# Patient Record
Sex: Female | Born: 1935 | Race: Black or African American | Hispanic: No | Smoking: Never smoker
Health system: Southern US, Community
[De-identification: ages and names within clinical notes are randomized; demographics above are authoritative.]

## PROBLEM LIST (undated history)

## (undated) DIAGNOSIS — I739 Peripheral vascular disease, unspecified: Secondary | ICD-10-CM

## (undated) DIAGNOSIS — I509 Heart failure, unspecified: Principal | ICD-10-CM

## (undated) DIAGNOSIS — R54 Age-related physical debility: Secondary | ICD-10-CM

## (undated) DIAGNOSIS — N189 Chronic kidney disease, unspecified: Secondary | ICD-10-CM

## (undated) DIAGNOSIS — D649 Anemia, unspecified: Secondary | ICD-10-CM

## (undated) DIAGNOSIS — I10 Essential (primary) hypertension: Secondary | ICD-10-CM

## (undated) DIAGNOSIS — E1129 Type 2 diabetes mellitus with other diabetic kidney complication: Secondary | ICD-10-CM

## (undated) DIAGNOSIS — I2510 Atherosclerotic heart disease of native coronary artery without angina pectoris: Secondary | ICD-10-CM

## (undated) DIAGNOSIS — E119 Type 2 diabetes mellitus without complications: Secondary | ICD-10-CM

## (undated) DIAGNOSIS — IMO0001 Reserved for inherently not codable concepts without codable children: Secondary | ICD-10-CM

## (undated) DIAGNOSIS — R011 Cardiac murmur, unspecified: Secondary | ICD-10-CM

## (undated) DIAGNOSIS — Z89619 Acquired absence of unspecified leg above knee: Secondary | ICD-10-CM

## (undated) HISTORY — DX: Peripheral vascular disease, unspecified: I73.9

## (undated) HISTORY — DX: Chronic kidney disease, unspecified: N18.9

## (undated) HISTORY — PX: VASCULAR SURGERY: SHX849

## (undated) HISTORY — DX: Essential (primary) hypertension: I10

## (undated) HISTORY — DX: Atherosclerotic heart disease of native coronary artery without angina pectoris: I25.10

## (undated) HISTORY — DX: Anemia, unspecified: D64.9

## (undated) HISTORY — PX: EYE SURGERY: SHX253

## (undated) HISTORY — DX: Heart failure, unspecified: I50.9

## (undated) HISTORY — DX: Type 2 diabetes mellitus with other diabetic kidney complication: E11.29

## (undated) HISTORY — DX: Type 2 diabetes mellitus without complications: E11.9

## (undated) HISTORY — DX: Age-related physical debility: R54

## (undated) HISTORY — DX: Cardiac murmur, unspecified: R01.1

## (undated) HISTORY — DX: Acquired absence of unspecified leg above knee: Z89.619

---

## 2000-11-14 DIAGNOSIS — I509 Heart failure, unspecified: Secondary | ICD-10-CM

## 2000-11-14 DIAGNOSIS — I2510 Atherosclerotic heart disease of native coronary artery without angina pectoris: Secondary | ICD-10-CM

## 2000-11-14 HISTORY — DX: Heart failure, unspecified: I50.9

## 2000-11-14 HISTORY — PX: OTHER SURGICAL HISTORY: SHX169

## 2000-11-14 HISTORY — PX: CORONARY ARTERY BYPASS GRAFT: SHX141

## 2000-11-14 HISTORY — DX: Atherosclerotic heart disease of native coronary artery without angina pectoris: I25.10

## 2001-03-14 ENCOUNTER — Encounter (INDEPENDENT_AMBULATORY_CARE_PROVIDER_SITE_OTHER): Payer: Self-pay | Admitting: Internal Medicine

## 2001-03-14 LAB — CONVERTED CEMR LAB: Hgb A1c MFr Bld: 5.6 %

## 2001-08-08 ENCOUNTER — Encounter: Payer: Self-pay | Admitting: Vascular Surgery

## 2001-08-09 ENCOUNTER — Inpatient Hospital Stay (HOSPITAL_COMMUNITY): Admission: RE | Admit: 2001-08-09 | Discharge: 2001-08-22 | Payer: Self-pay | Admitting: Surgery

## 2001-08-09 ENCOUNTER — Encounter: Payer: Self-pay | Admitting: Vascular Surgery

## 2001-08-11 ENCOUNTER — Encounter: Payer: Self-pay | Admitting: Vascular Surgery

## 2001-08-15 ENCOUNTER — Encounter: Payer: Self-pay | Admitting: Surgery

## 2001-08-16 ENCOUNTER — Encounter: Payer: Self-pay | Admitting: Surgery

## 2001-08-17 ENCOUNTER — Encounter: Payer: Self-pay | Admitting: Surgery

## 2001-08-18 ENCOUNTER — Encounter: Payer: Self-pay | Admitting: Surgery

## 2001-08-19 ENCOUNTER — Encounter: Payer: Self-pay | Admitting: Cardiothoracic Surgery

## 2001-08-20 ENCOUNTER — Encounter: Payer: Self-pay | Admitting: Cardiothoracic Surgery

## 2001-08-28 ENCOUNTER — Encounter: Payer: Self-pay | Admitting: Vascular Surgery

## 2001-08-30 ENCOUNTER — Encounter (INDEPENDENT_AMBULATORY_CARE_PROVIDER_SITE_OTHER): Payer: Self-pay | Admitting: *Deleted

## 2001-08-30 ENCOUNTER — Inpatient Hospital Stay (HOSPITAL_COMMUNITY): Admission: RE | Admit: 2001-08-30 | Discharge: 2001-09-04 | Payer: Self-pay | Admitting: Vascular Surgery

## 2001-11-01 ENCOUNTER — Encounter: Admission: RE | Admit: 2001-11-01 | Discharge: 2002-01-30 | Payer: Self-pay | Admitting: Internal Medicine

## 2002-01-12 ENCOUNTER — Encounter (INDEPENDENT_AMBULATORY_CARE_PROVIDER_SITE_OTHER): Payer: Self-pay | Admitting: Internal Medicine

## 2002-01-12 LAB — CONVERTED CEMR LAB: Hgb A1c MFr Bld: 6.4 %

## 2002-07-15 ENCOUNTER — Encounter (INDEPENDENT_AMBULATORY_CARE_PROVIDER_SITE_OTHER): Payer: Self-pay | Admitting: Internal Medicine

## 2002-11-14 HISTORY — PX: OTHER SURGICAL HISTORY: SHX169

## 2003-02-13 ENCOUNTER — Encounter (INDEPENDENT_AMBULATORY_CARE_PROVIDER_SITE_OTHER): Payer: Self-pay | Admitting: Internal Medicine

## 2003-02-13 LAB — CONVERTED CEMR LAB

## 2003-04-07 ENCOUNTER — Ambulatory Visit (HOSPITAL_COMMUNITY): Admission: RE | Admit: 2003-04-07 | Discharge: 2003-04-07 | Payer: Self-pay | Admitting: Cardiology

## 2003-04-07 ENCOUNTER — Encounter: Payer: Self-pay | Admitting: Cardiology

## 2003-06-03 ENCOUNTER — Encounter: Payer: Self-pay | Admitting: Vascular Surgery

## 2003-06-05 ENCOUNTER — Inpatient Hospital Stay (HOSPITAL_COMMUNITY): Admission: RE | Admit: 2003-06-05 | Discharge: 2003-06-06 | Payer: Self-pay | Admitting: Vascular Surgery

## 2003-08-15 ENCOUNTER — Encounter (INDEPENDENT_AMBULATORY_CARE_PROVIDER_SITE_OTHER): Payer: Self-pay | Admitting: Internal Medicine

## 2004-01-29 ENCOUNTER — Ambulatory Visit (HOSPITAL_COMMUNITY): Admission: RE | Admit: 2004-01-29 | Discharge: 2004-01-29 | Payer: Self-pay | Admitting: Vascular Surgery

## 2004-03-14 ENCOUNTER — Encounter (INDEPENDENT_AMBULATORY_CARE_PROVIDER_SITE_OTHER): Payer: Self-pay | Admitting: Internal Medicine

## 2004-03-14 LAB — CONVERTED CEMR LAB: Hgb A1c MFr Bld: 6.4 %

## 2004-09-14 ENCOUNTER — Encounter (INDEPENDENT_AMBULATORY_CARE_PROVIDER_SITE_OTHER): Payer: Self-pay | Admitting: Internal Medicine

## 2004-09-14 LAB — CONVERTED CEMR LAB: Hgb A1c MFr Bld: 6.4 %

## 2004-10-04 ENCOUNTER — Ambulatory Visit: Payer: Self-pay | Admitting: Family Medicine

## 2004-11-14 HISTORY — PX: OTHER SURGICAL HISTORY: SHX169

## 2004-12-14 ENCOUNTER — Encounter: Admission: RE | Admit: 2004-12-14 | Discharge: 2004-12-14 | Payer: Self-pay | Admitting: Cardiology

## 2005-08-14 ENCOUNTER — Encounter (INDEPENDENT_AMBULATORY_CARE_PROVIDER_SITE_OTHER): Payer: Self-pay | Admitting: Internal Medicine

## 2005-08-25 ENCOUNTER — Ambulatory Visit: Payer: Self-pay | Admitting: Family Medicine

## 2005-08-30 ENCOUNTER — Ambulatory Visit: Payer: Self-pay | Admitting: Family Medicine

## 2005-09-05 ENCOUNTER — Ambulatory Visit: Payer: Self-pay | Admitting: Family Medicine

## 2005-09-19 ENCOUNTER — Ambulatory Visit: Payer: Self-pay | Admitting: Internal Medicine

## 2005-09-26 ENCOUNTER — Ambulatory Visit: Payer: Self-pay | Admitting: Family Medicine

## 2005-10-19 ENCOUNTER — Emergency Department (HOSPITAL_COMMUNITY): Admission: EM | Admit: 2005-10-19 | Discharge: 2005-10-19 | Payer: Self-pay | Admitting: Emergency Medicine

## 2005-12-15 ENCOUNTER — Encounter (INDEPENDENT_AMBULATORY_CARE_PROVIDER_SITE_OTHER): Payer: Self-pay | Admitting: Internal Medicine

## 2005-12-15 LAB — CONVERTED CEMR LAB: Hgb A1c MFr Bld: 6.8 %

## 2005-12-26 ENCOUNTER — Ambulatory Visit: Payer: Self-pay | Admitting: Family Medicine

## 2005-12-26 ENCOUNTER — Encounter (INDEPENDENT_AMBULATORY_CARE_PROVIDER_SITE_OTHER): Payer: Self-pay | Admitting: Internal Medicine

## 2005-12-26 LAB — CONVERTED CEMR LAB: Microalbumin U total vol: 38.6 mg/L

## 2006-02-01 IMAGING — CR DG CHEST 2V
2 series · 2 of 2 positions shown · non-contrast
Comparison: 06/03/03.

CLINICAL DATA: Preoperative respiratory exam prior to left lower extremity arteriogram.
 TWO VIEW CHEST

[view not recorded (1 of 2)]
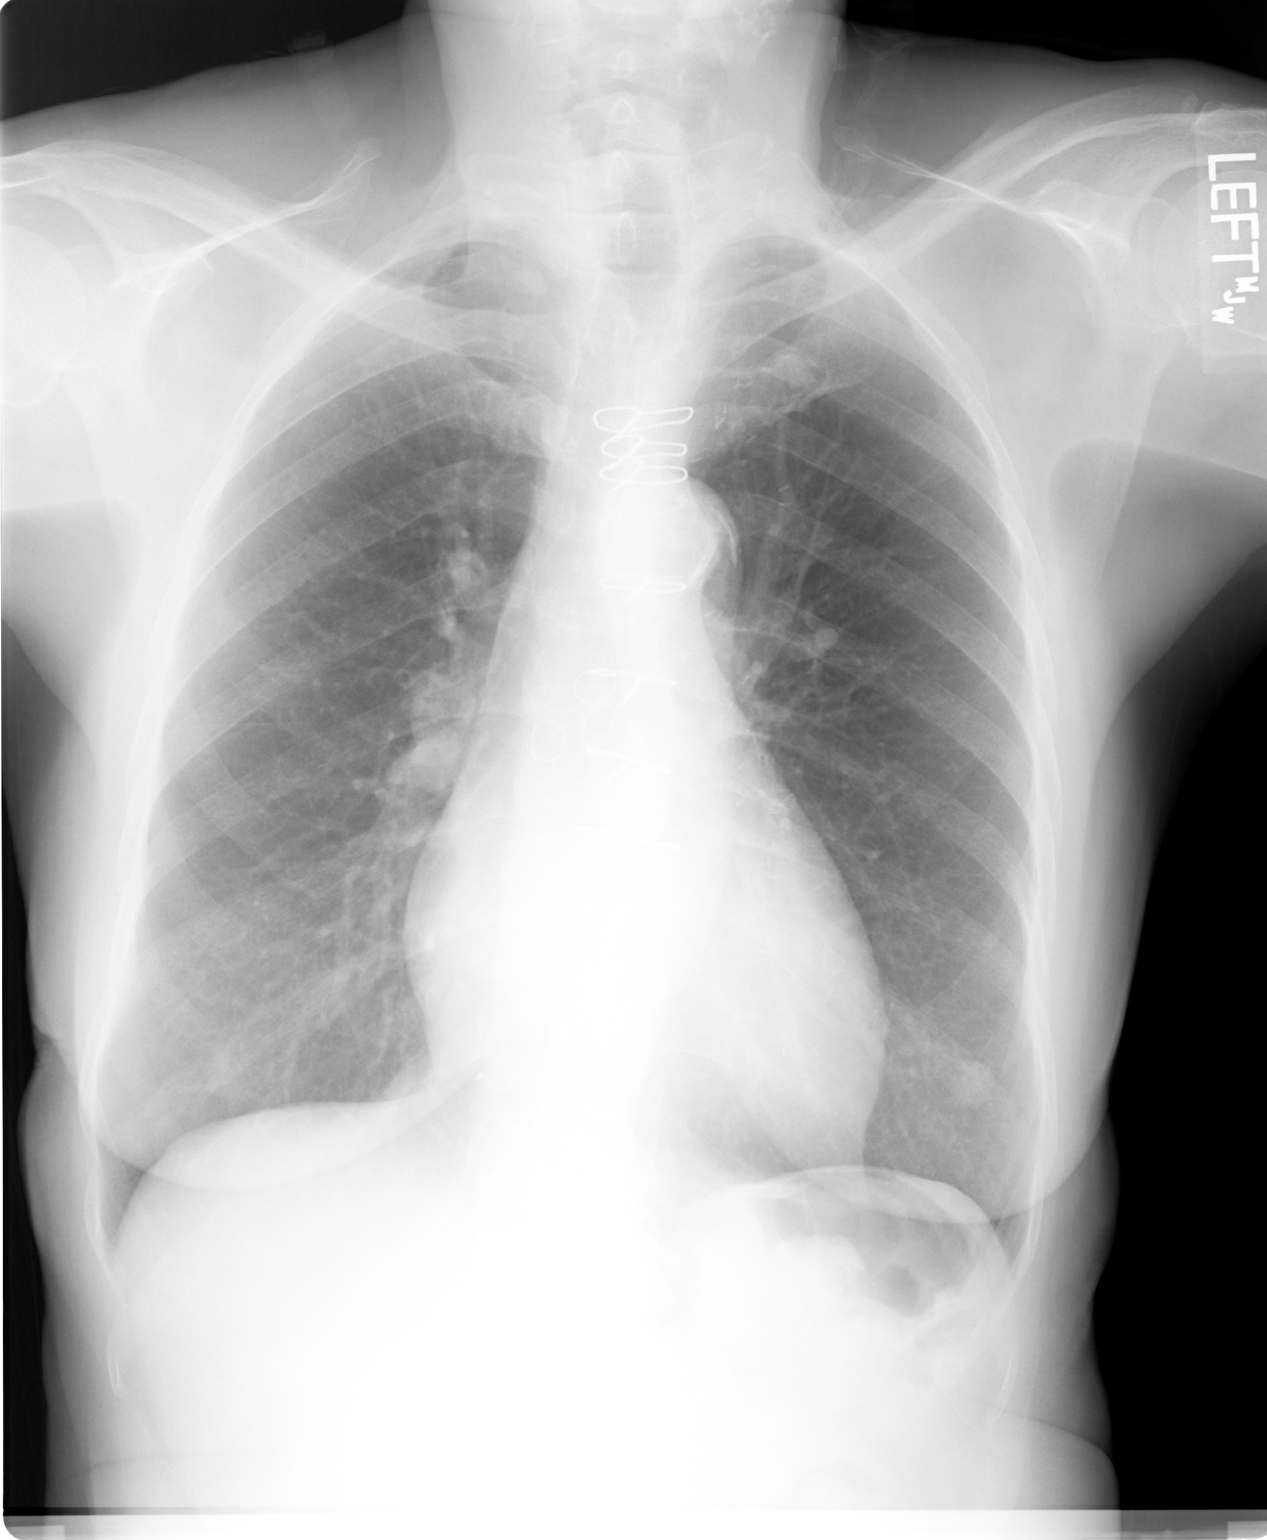

[view not recorded (2 of 2)]
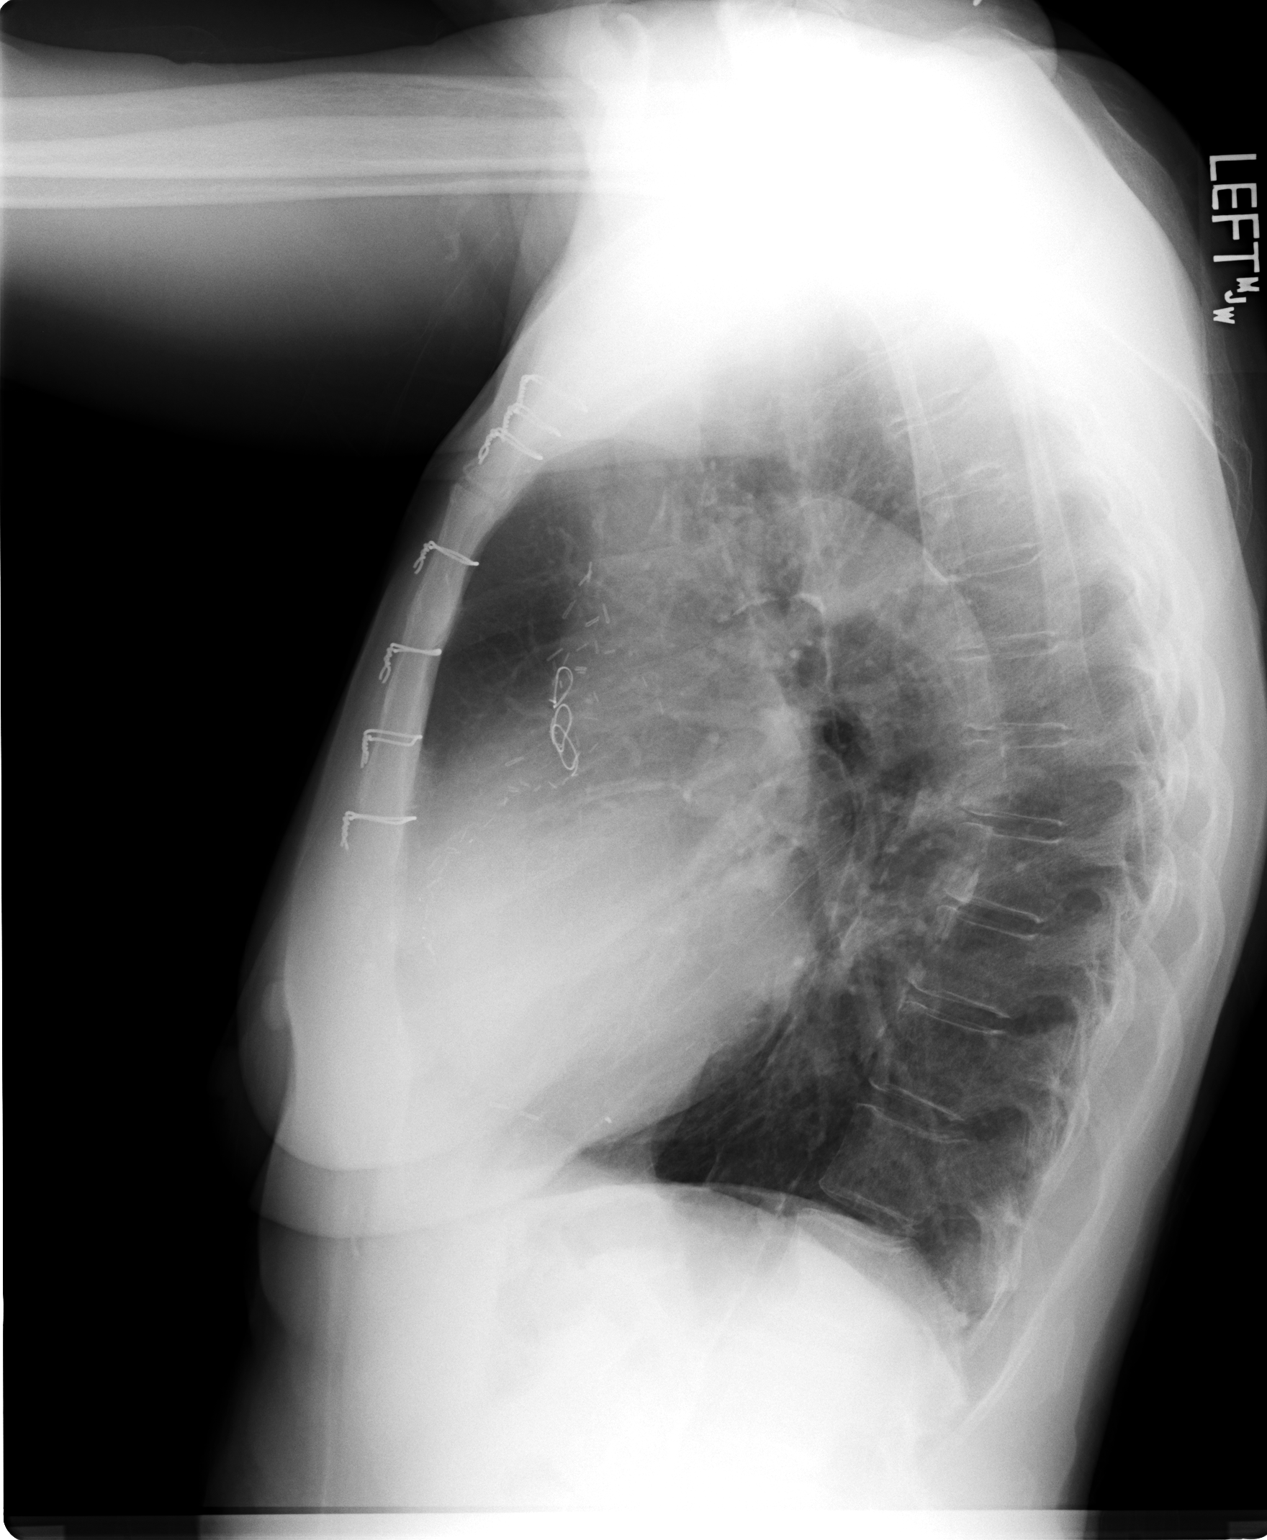

[2 of 2 positions shown; findings below may reference images not displayed]

No evidence of edema, infiltrate, or pleural effusion.  The heart size is normal.  There is a rounded opacity at the left lung base in the frontal projection which based on the lateral view is thought to represent a nipple shadow. 
 IMPRESSION
 No active disease.

## 2006-06-27 ENCOUNTER — Ambulatory Visit: Payer: Self-pay | Admitting: Family Medicine

## 2006-06-27 ENCOUNTER — Encounter (INDEPENDENT_AMBULATORY_CARE_PROVIDER_SITE_OTHER): Payer: Self-pay | Admitting: Internal Medicine

## 2006-06-27 LAB — CONVERTED CEMR LAB: Hgb A1c MFr Bld: 6.2 %

## 2006-08-21 ENCOUNTER — Ambulatory Visit: Payer: Self-pay | Admitting: Family Medicine

## 2006-09-12 ENCOUNTER — Ambulatory Visit: Payer: Self-pay | Admitting: Family Medicine

## 2007-02-01 ENCOUNTER — Encounter (INDEPENDENT_AMBULATORY_CARE_PROVIDER_SITE_OTHER): Payer: Self-pay | Admitting: Internal Medicine

## 2007-04-16 ENCOUNTER — Ambulatory Visit: Payer: Self-pay | Admitting: Vascular Surgery

## 2007-05-02 ENCOUNTER — Encounter (INDEPENDENT_AMBULATORY_CARE_PROVIDER_SITE_OTHER): Payer: Self-pay | Admitting: Internal Medicine

## 2007-05-03 ENCOUNTER — Ambulatory Visit: Payer: Self-pay | Admitting: Family Medicine

## 2007-05-10 ENCOUNTER — Telehealth (INDEPENDENT_AMBULATORY_CARE_PROVIDER_SITE_OTHER): Payer: Self-pay | Admitting: Internal Medicine

## 2007-05-11 LAB — CONVERTED CEMR LAB
Calcium: 9.9 mg/dL (ref 8.4–10.5)
Chloride: 105 meq/L (ref 96–112)
Creatinine, Ser: 2.1 mg/dL — ABNORMAL HIGH (ref 0.4–1.2)
GFR calc non Af Amer: 24 mL/min
Hgb A1c MFr Bld: 7 % — ABNORMAL HIGH (ref 4.6–6.0)

## 2007-08-09 ENCOUNTER — Encounter (INDEPENDENT_AMBULATORY_CARE_PROVIDER_SITE_OTHER): Payer: Self-pay | Admitting: Internal Medicine

## 2007-08-30 ENCOUNTER — Ambulatory Visit: Payer: Self-pay | Admitting: Family Medicine

## 2007-09-05 LAB — CONVERTED CEMR LAB
Calcium: 9.7 mg/dL (ref 8.4–10.5)
Chloride: 112 meq/L (ref 96–112)
Creatinine, Ser: 1.6 mg/dL — ABNORMAL HIGH (ref 0.4–1.2)
GFR calc non Af Amer: 32 mL/min
Hgb A1c MFr Bld: 6.6 % — ABNORMAL HIGH (ref 4.6–6.0)

## 2007-11-06 ENCOUNTER — Ambulatory Visit: Payer: Self-pay | Admitting: Vascular Surgery

## 2007-12-26 ENCOUNTER — Ambulatory Visit: Payer: Self-pay | Admitting: Family Medicine

## 2007-12-31 LAB — CONVERTED CEMR LAB
BUN: 40 mg/dL — ABNORMAL HIGH (ref 6–23)
Calcium: 9.4 mg/dL (ref 8.4–10.5)
Chloride: 108 meq/L (ref 96–112)
Creatinine, Ser: 1.7 mg/dL — ABNORMAL HIGH (ref 0.4–1.2)
GFR calc non Af Amer: 30 mL/min
Hgb A1c MFr Bld: 6.5 % — ABNORMAL HIGH (ref 4.6–6.0)

## 2008-02-11 ENCOUNTER — Encounter (INDEPENDENT_AMBULATORY_CARE_PROVIDER_SITE_OTHER): Payer: Self-pay | Admitting: Internal Medicine

## 2008-03-12 ENCOUNTER — Encounter (INDEPENDENT_AMBULATORY_CARE_PROVIDER_SITE_OTHER): Payer: Self-pay | Admitting: Internal Medicine

## 2008-06-30 ENCOUNTER — Ambulatory Visit: Payer: Self-pay | Admitting: Family Medicine

## 2008-07-01 LAB — CONVERTED CEMR LAB
CO2: 28 meq/L (ref 19–32)
Chloride: 107 meq/L (ref 96–112)
Creatinine, Ser: 1.2 mg/dL (ref 0.4–1.2)
GFR calc non Af Amer: 45 mL/min
Hgb A1c MFr Bld: 6.6 % — ABNORMAL HIGH (ref 4.6–6.0)
Potassium: 4.6 meq/L (ref 3.5–5.1)
Sodium: 142 meq/L (ref 135–145)

## 2008-10-29 ENCOUNTER — Ambulatory Visit: Payer: Self-pay | Admitting: Vascular Surgery

## 2008-12-03 ENCOUNTER — Encounter (INDEPENDENT_AMBULATORY_CARE_PROVIDER_SITE_OTHER): Payer: Self-pay | Admitting: Internal Medicine

## 2008-12-10 ENCOUNTER — Encounter (INDEPENDENT_AMBULATORY_CARE_PROVIDER_SITE_OTHER): Payer: Self-pay | Admitting: Internal Medicine

## 2009-01-01 ENCOUNTER — Ambulatory Visit: Payer: Self-pay | Admitting: Family Medicine

## 2009-01-06 LAB — CONVERTED CEMR LAB: Hgb A1c MFr Bld: 7.3 % — ABNORMAL HIGH (ref 4.6–6.0)

## 2009-04-08 ENCOUNTER — Ambulatory Visit: Payer: Self-pay | Admitting: Family Medicine

## 2009-04-09 LAB — CONVERTED CEMR LAB: Hgb A1c MFr Bld: 7.1 % — ABNORMAL HIGH (ref 4.6–6.5)

## 2009-05-08 ENCOUNTER — Ambulatory Visit: Payer: Self-pay | Admitting: Vascular Surgery

## 2009-05-20 ENCOUNTER — Telehealth: Payer: Self-pay | Admitting: Family Medicine

## 2009-06-26 ENCOUNTER — Telehealth (INDEPENDENT_AMBULATORY_CARE_PROVIDER_SITE_OTHER): Payer: Self-pay | Admitting: *Deleted

## 2009-07-23 ENCOUNTER — Ambulatory Visit: Payer: Self-pay | Admitting: Family Medicine

## 2009-07-28 ENCOUNTER — Encounter (INDEPENDENT_AMBULATORY_CARE_PROVIDER_SITE_OTHER): Payer: Self-pay | Admitting: Internal Medicine

## 2009-08-03 ENCOUNTER — Encounter (INDEPENDENT_AMBULATORY_CARE_PROVIDER_SITE_OTHER): Payer: Self-pay | Admitting: Internal Medicine

## 2009-11-27 ENCOUNTER — Encounter: Payer: Self-pay | Admitting: Family Medicine

## 2009-12-08 ENCOUNTER — Encounter: Payer: Self-pay | Admitting: Family Medicine

## 2010-01-28 ENCOUNTER — Ambulatory Visit: Payer: Self-pay | Admitting: Family Medicine

## 2010-01-29 LAB — CONVERTED CEMR LAB
Calcium: 9.6 mg/dL (ref 8.4–10.5)
GFR calc non Af Amer: 28.49 mL/min (ref >60)
Glucose, Bld: 131 mg/dL — ABNORMAL HIGH (ref 70–99)
Hgb A1c MFr Bld: 6.7 % — ABNORMAL HIGH (ref 4.6–6.5)
Sodium: 135 meq/L (ref 135–145)

## 2010-02-01 ENCOUNTER — Ambulatory Visit: Payer: Self-pay | Admitting: Family Medicine

## 2010-02-03 LAB — CONVERTED CEMR LAB
BUN: 48 mg/dL — ABNORMAL HIGH (ref 6–23)
GFR calc non Af Amer: 34.04 mL/min (ref >60)
Potassium: 5 meq/L (ref 3.5–5.1)

## 2010-06-21 ENCOUNTER — Ambulatory Visit: Payer: Self-pay | Admitting: Family Medicine

## 2010-06-23 ENCOUNTER — Encounter: Payer: Self-pay | Admitting: Family Medicine

## 2010-06-23 LAB — CONVERTED CEMR LAB
CO2: 27 meq/L (ref 19–32)
Calcium: 9.3 mg/dL (ref 8.4–10.5)
GFR calc non Af Amer: 34.9 mL/min (ref >60)
Potassium: 4.8 meq/L (ref 3.5–5.1)
Sodium: 135 meq/L (ref 135–145)

## 2010-07-22 ENCOUNTER — Ambulatory Visit: Payer: Self-pay | Admitting: Vascular Surgery

## 2010-07-26 ENCOUNTER — Ambulatory Visit: Payer: Self-pay | Admitting: Cardiology

## 2010-08-16 ENCOUNTER — Ambulatory Visit: Payer: Self-pay | Admitting: Family Medicine

## 2010-08-27 ENCOUNTER — Encounter: Payer: Self-pay | Admitting: Family Medicine

## 2010-08-30 ENCOUNTER — Encounter: Payer: Self-pay | Admitting: Family Medicine

## 2010-12-14 NOTE — Letter (Signed)
Summary: Dr.Richard Fox,[facility]  Dr.Richard Fox,[facility]   Imported By: Beau Fanny 01/07/2010 08:31:40  _____________________________________________________________________  External Attachment:    Type:   Image     Comment:   External Document

## 2010-12-14 NOTE — Letter (Signed)
Summary: Generic Letter  [facility]  [address] [place], [place] [address]   Phone: [phone]  Fax: [phone]    06/23/2010  Adrienne Bowman [address] [place], [place]  [address]  Dear Adrienne Bowman,    We have received your lab results and your potassium level looks good and your renal function test, (kidney functions) are normal.         Sincerely,      Ruthe Mannan, MD

## 2010-12-14 NOTE — Letter (Signed)
Summary: [facility],Note  [facility],Note   Imported By: Beau Fanny 09/13/2010 16:51:32  _____________________________________________________________________  External Attachment:    Type:   Image     Comment:   External Document

## 2010-12-14 NOTE — Assessment & Plan Note (Signed)
Summary: F/U/CLE   Vital Signs:  Patient profile:   77 year old female Height:      65 inches Weight:      119.38 pounds BMI:     19.94 Temp:     98.1 degrees F oral Pulse rate:   68 / minute Pulse rhythm:   regular BP sitting:   160 / 60  (left arm) Cuff size:   regular  Vitals Entered By: Linde Gillis CMA Duncan Dull) (June 21, 2010 3:42 PM) CC: follow-up visit   History of Present Illness: 25 pt new to me here for up DM, HTN.  DM does not check CBGs, no hypoglycemic episodes. Taking Actros 15 mg without issue.  a1c was 6.7 in 01/2010.  HTN- BP elevated again today. Appears this has been a chronic issue to control her blood pressure.  Denies any CP, SOB blurred vision.  Taking Altace 10 mg two times a day, Aldactone 25 mg daily, Aduet 10-10 mg daily.  Chronic kidney disease- potassium elevated in march, will recheck again today.  Current Medications (verified): 1)  Atenolol 50 Mg  Tabs (Atenolol) .... Take One By Mouth Daily 2)  Actos 15 Mg  Tabs (Pioglitazone Hcl) .... Take One By Mouth Daily 3)  Altace 10 Mg  Caps (Ramipril) .... Take One By Mouth Two Times A Day 4)  Aldactone 25 Mg  Tabs (Spironolactone) .... Take One By Mouth Daily 5)  Hydralazine Hcl 50 Mg  Tabs (Hydralazine Hcl) .... Take One By Mouth Two Times A Day 6)  Caduet 10-10 Mg  Tabs (Amlodipine-Atorvastatin) .... Take One By Mouth Daily 7)  Pepcid 20 Mg  Tabs (Famotidine) .... Take One By Mouth Daily 8)  Baby Aspirin   Chew (Aspirin Chew) .... Take One By Mouth Daily 9)  One-Daily Multivitamins   Tabs (Multiple Vitamin) .... Take One By Mouth Daily 10)  Clonidine Hcl 0.1 Mg  Tabs (Clonidine Hcl) .... Take One By Mouth At Bedtime 11)  Clonidine Hcl 0.2 Mg  Tabs (Clonidine Hcl) .... Take One By Mouth Two Times A Day  Allergies (verified): No Known Drug Allergies  Past History:  Past Medical History: Last updated: 05/02/2007 Congestive heart failure 9/02 Hypertension Myocardial infarction, hx of  9/02 Diabetes mellitus, type II  Past Surgical History: Last updated: 05/02/2007 Pt. refuses MMG 06/97 Stool cards pt refuses 06/97 FFS pt refuses 06/97 Pneumovax 10/97 refused Flu shot refused 10/97 Bypass right femoral to pass tibia grafts 08/16/01 Left AK amp 12/06 Vaginal hystectomy, A & P repairs, vaginal prolapse 04/07 Blood transfusion 04/07 Renal artery duplex-- negative 11/07  Social History: Last updated: 01/01/2009 Marital Status: widow Children:  Occupation: retired, at home.  8/09--lives alone with daughters now driving and doing heavy housework.  12/2008 unchanged  Risk Factors: Exercise: no (06/30/2008)  Risk Factors: Smoking Status: never (05/02/2007) Passive Smoke Exposure: no (06/30/2008)  Review of Systems      See HPI General:  Denies malaise. CV:  Denies chest pain or discomfort. Resp:  Denies shortness of breath. MS:  Denies joint pain, joint redness, joint swelling, and muscle aches.  Physical Exam  General:  alert, well-developed, well-nourished, and well-hydrated.   Lungs:  normal respiratory effort, no intercostal retractions, no accessory muscle use, and normal breath sounds.   Heart:  irregular rhythm.   Extremities:  no edema in R leg Psych:  normally interactive, good eye contact, not anxious appearing, and not depressed appearing.     Impression & Recommendations:  Problem #  1:  CHRONIC KIDNEY DISEASE UNSPECIFIED (ICD-585.9) Assessment Unchanged stable, recheck BMET today. Orders: Venipuncture (11914) TLB-BMP (Basic Metabolic Panel-BMET) (80048-METABOL)  Problem # 2:  HYPERTENSION (ICD-401.9) Assessment: Unchanged remains elevated, pt on multiple antihypertensives and prefers not to adjust medications again. Her updated medication list for this problem includes:    Atenolol 50 Mg Tabs (Atenolol) .[name]... Take one by mouth daily    Altace 10 Mg Caps (Ramipril) .[name]... Take one by mouth two times a day    Aldactone 25 Mg Tabs  (Spironolactone) .[name]... Take one by mouth daily    Hydralazine Hcl 50 Mg Tabs (Hydralazine hcl) .[name]... Take one by mouth two times a day    Caduet 10-10 Mg Tabs (Amlodipine-atorvastatin) .[name]... Take one by mouth daily    Clonidine Hcl 0.1 Mg Tabs (Clonidine hcl) .[name]... Take one by mouth at bedtime    Clonidine Hcl 0.2 Mg Tabs (Clonidine hcl) .[name]... Take one by mouth two times a day  Problem # 3:  DIABETES MELLITUS, TYPE II (ICD-250.00) Assessment: Unchanged stable on current meds. Her updated medication list for this problem includes:    Actos 15 Mg Tabs (Pioglitazone hcl) .[name]... Take one by mouth daily    Altace 10 Mg Caps (Ramipril) .[name]... Take one by mouth two times a day    Baby Aspirin Chew (Aspirin chew) .[name]... Take one by mouth daily  Complete Medication List: 1)  Atenolol 50 Mg Tabs (Atenolol) .... Take one by mouth daily 2)  Actos 15 Mg Tabs (Pioglitazone hcl) .... Take one by mouth daily 3)  Altace 10 Mg Caps (Ramipril) .... Take one by mouth two times a day 4)  Aldactone 25 Mg Tabs (Spironolactone) .... Take one by mouth daily 5)  Hydralazine Hcl 50 Mg Tabs (Hydralazine hcl) .... Take one by mouth two times a day 6)  Caduet 10-10 Mg Tabs (Amlodipine-atorvastatin) .... Take one by mouth daily 7)  Pepcid 20 Mg Tabs (Famotidine) .... Take one by mouth daily 8)  Baby Aspirin Chew (Aspirin chew) .... Take one by mouth daily 9)  One-daily Multivitamins Tabs (Multiple vitamin) .... Take one by mouth daily 10)  Clonidine Hcl 0.1 Mg Tabs (Clonidine hcl) .... Take one by mouth at bedtime 11)  Clonidine Hcl 0.2 Mg Tabs (Clonidine hcl) .... Take one by mouth two times a day  Current Allergies (reviewed today): No known allergies

## 2010-12-14 NOTE — Assessment & Plan Note (Signed)
Summary: 30 MIN F/U Adrienne Bowman'S PT,JRR   Vital Signs:  Patient profile:   77 year old female Height:      65 inches Weight:      117.25 pounds Temp:     97.8 degrees F oral Pulse rate:   92 / minute Pulse rhythm:   regular BP sitting:   160 / 86  (left arm) Cuff size:   regular  Vitals Entered By: Delilah Shan CMA Duncan Dull) (January 28, 2010 3:21 PM) CC: 30 minute follow up (BDB)   History of Present Illness: 36 pt new to me here to establish care and ollow up DM, HTN.  DM does not check CBGs, no hypoglycemic episodes. Taking Actros 15 mg without issue.  HTN- BP elevated today. Previous notes and cardiology notes reviewed. Appears this has been a chronic issue to control her blood pressure.  Denies any CP, SOB blurred vision.  Taking Altace 10 mg two times a day, Aldactone 25 mg daily, Aduet 10-10 mg daily.  Lives alone but dauther Adrienne Bowman lives near by.  Strong family support.   Current Medications (verified): 1)  Atenolol 50 Mg  Tabs (Atenolol) .... Take One By Mouth Daily 2)  Actos 15 Mg  Tabs (Pioglitazone Hcl) .... Take One By Mouth Daily 3)  Altace 10 Mg  Caps (Ramipril) .... Take One By Mouth Two Times A Day 4)  Aldactone 25 Mg  Tabs (Spironolactone) .... Take One By Mouth Daily 5)  Hydralazine Hcl 50 Mg  Tabs (Hydralazine Hcl) .... Take One By Mouth Two Times A Day 6)  Caduet 10-10 Mg  Tabs (Amlodipine-Atorvastatin) .... Take One By Mouth Daily 7)  Pepcid 20 Mg  Tabs (Famotidine) .... Take One By Mouth Daily 8)  Baby Aspirin   Chew (Aspirin Chew) .... Take One By Mouth Daily 9)  One-Daily Multivitamins   Tabs (Multiple Vitamin) .... Take One By Mouth Daily 10)  Clonidine Hcl 0.1 Mg  Tabs (Clonidine Hcl) .... Take One By Mouth At Bedtime 11)  Clonidine Hcl 0.2 Mg  Tabs (Clonidine Hcl) .... Take One By Mouth Two Times A Day  Allergies (verified): No Known Drug Allergies  Past History:  Past Medical History: Last updated: 05/02/2007 Congestive heart failure  9/02 Hypertension Myocardial infarction, hx of 9/02 Diabetes mellitus, type II  Past Surgical History: Last updated: 05/02/2007 Pt. refuses MMG 06/97 Stool cards pt refuses 06/97 FFS pt refuses 06/97 Pneumovax 10/97 refused Flu shot refused 10/97 Bypass right femoral to pass tibia grafts 08/16/01 Left AK amp 12/06 Vaginal hystectomy, A & P repairs, vaginal prolapse 04/07 Blood transfusion 04/07 Renal artery duplex-- negative 11/07  Review of Systems      See HPI General:  Denies malaise and weakness. Eyes:  Denies blurring. CV:  Denies chest pain or discomfort. Resp:  Denies shortness of breath. Derm:  Denies rash. Psych:  Denies anxiety and depression.  Physical Exam  General:  alert, well-developed, well-nourished, and well-hydrated.   Mouth:  MMM Lungs:  normal respiratory effort, no intercostal retractions, no accessory muscle use, and normal breath sounds.   Heart:  irregular rhythm.   Extremities:  no edema in R leg Neurologic:  alert & oriented X3, sensation intact to light touch, and gait normal.   Psych:  normally interactive, good eye contact, not anxious appearing, and not depressed appearing.     Impression & Recommendations:  Problem # 1:  DIABETES MELLITUS, TYPE II (ICD-250.00) Assessment Unchanged a1c today, continue current meds. Her updated medication list  for this problem includes:    Actos 15 Mg Tabs (Pioglitazone hcl) .[name]... Take one by mouth daily    Altace 10 Mg Caps (Ramipril) .[name]... Take one by mouth two times a day    Baby Aspirin Chew (Aspirin chew) .[name]... Take one by mouth daily  Orders: TLB-A1C / Hgb A1C (Glycohemoglobin) (83036-A1C)  Problem # 2:  HYPERTENSION (ICD-401.9) Assessment: Unchanged Remains elevated but managed but Dr. Deborah Chalk who is trying to control her difficult BP.  Appears to be stable, asymptomatic. Her updated medication list for this problem includes:    Atenolol 50 Mg Tabs (Atenolol) .[name]... Take one by mouth daily     Altace 10 Mg Caps (Ramipril) .[name]... Take one by mouth two times a day    Aldactone 25 Mg Tabs (Spironolactone) .[name]... Take one by mouth daily    Hydralazine Hcl 50 Mg Tabs (Hydralazine hcl) .[name]... Take one by mouth two times a day    Caduet 10-10 Mg Tabs (Amlodipine-atorvastatin) .[name]... Take one by mouth daily    Clonidine Hcl 0.1 Mg Tabs (Clonidine hcl) .[name]... Take one by mouth at bedtime    Clonidine Hcl 0.2 Mg Tabs (Clonidine hcl) .[name]... Take one by mouth two times a day  Complete Medication List: 1)  Atenolol 50 Mg Tabs (Atenolol) .... Take one by mouth daily 2)  Actos 15 Mg Tabs (Pioglitazone hcl) .... Take one by mouth daily 3)  Altace 10 Mg Caps (Ramipril) .... Take one by mouth two times a day 4)  Aldactone 25 Mg Tabs (Spironolactone) .... Take one by mouth daily 5)  Hydralazine Hcl 50 Mg Tabs (Hydralazine hcl) .... Take one by mouth two times a day 6)  Caduet 10-10 Mg Tabs (Amlodipine-atorvastatin) .... Take one by mouth daily 7)  Pepcid 20 Mg Tabs (Famotidine) .... Take one by mouth daily 8)  Baby Aspirin Chew (Aspirin chew) .... Take one by mouth daily 9)  One-daily Multivitamins Tabs (Multiple vitamin) .... Take one by mouth daily 10)  Clonidine Hcl 0.1 Mg Tabs (Clonidine hcl) .... Take one by mouth at bedtime 11)  Clonidine Hcl 0.2 Mg Tabs (Clonidine hcl) .... Take one by mouth two times a day  Other Orders: Venipuncture (16109) TLB-BMP (Basic Metabolic Panel-BMET) (80048-METABOL)   Geriatric Assessment:  Activities of Daily Living:    Bathing-independent    Dressing-independent    Eating-independent    Toileting-independent    Transferring-independent    Continence-independent  Instrumental Activities of Daily Living:    Transportation-dependent    Meal/Food Preparation-independent    Shopping Errands-assisted    Housekeeping/Chores-independent    Money Management/Finances-independent    Medication Management-independent    Ability to Use Telephone-independent     Laundry-independent  Mental Status Exam: (value/max value)    Orientation to Time: 5/5    Orientation to Place: 5/5    Registration: 3/3    Attention/Calculation: 5/5    Recall: 3/3    Language-name 2 objects: 2/2    Language-repeat: 1/1    Language-follow 3-step command: 3/3    Language-read and follow direction: 1/1    Write a sentence: 1/1 MSE Total score: 29/30

## 2010-12-14 NOTE — Assessment & Plan Note (Signed)
Summary: OV/CLE   Vital Signs:  Patient profile:   77 year old female Height:      65 inches Weight:      117 pounds BMI:     19.54 Temp:     98.0 degrees F oral Pulse rate:   64 / minute Pulse rhythm:   regular BP sitting:   132 / 52 Cuff size:   regular  Vitals Entered By: Linde Gillis CMA Duncan Dull) (August 16, 2010 3:55 PM) CC: constipation   History of Present Illness: 77 yo here to discuss constipation.   Here with her grand daughter.  Normally does not have issues with constipation.  Starting 2 weeks ago, she had to really strain to have a BM.  NO blood in stool, no abdominal pain.  Does not think her diet has changed much.  No nausea or vomiting.  Drank some warm prune juice last night and had a very large BM this morning.  Feels much better now.  Made this appointment prior to having the BM.      Current Medications (verified): 1)  Atenolol 50 Mg  Tabs (Atenolol) .... Take One By Mouth Daily 2)  Actos 15 Mg  Tabs (Pioglitazone Hcl) .... Take One By Mouth Daily 3)  Altace 10 Mg  Caps (Ramipril) .... Take One By Mouth Two Times A Day 4)  Aldactone 25 Mg  Tabs (Spironolactone) .... Take One By Mouth Daily 5)  Hydralazine Hcl 50 Mg  Tabs (Hydralazine Hcl) .... Take One By Mouth Two Times A Day 6)  Caduet 10-10 Mg  Tabs (Amlodipine-Atorvastatin) .... Take One By Mouth Daily 7)  Pepcid 20 Mg  Tabs (Famotidine) .... Take One By Mouth Daily 8)  Baby Aspirin   Chew (Aspirin Chew) .... Take One By Mouth Daily 9)  One-Daily Multivitamins   Tabs (Multiple Vitamin) .... Take One By Mouth Daily 10)  Clonidine Hcl 0.1 Mg  Tabs (Clonidine Hcl) .... Take One By Mouth At Bedtime 11)  Clonidine Hcl 0.2 Mg  Tabs (Clonidine Hcl) .... Take One By Mouth Two Times A Day  Allergies (verified): No Known Drug Allergies  Past History:  Past Medical History: Last updated: 05/02/2007 Congestive heart failure 9/02 Hypertension Myocardial infarction, hx of 9/02 Diabetes mellitus, type  II  Past Surgical History: Last updated: 05/02/2007 Pt. refuses MMG 06/97 Stool cards pt refuses 06/97 FFS pt refuses 06/97 Pneumovax 10/97 refused Flu shot refused 10/97 Bypass right femoral to pass tibia grafts 08/16/01 Left AK amp 12/06 Vaginal hystectomy, A & P repairs, vaginal prolapse 04/07 Blood transfusion 04/07 Renal artery duplex-- negative 11/07  Social History: Last updated: 01/01/2009 Marital Status: widow Children:  Occupation: retired, at home.  8/09--lives alone with daughters now driving and doing heavy housework.  12/2008 unchanged  Risk Factors: Exercise: no (06/30/2008)  Risk Factors: Smoking Status: never (05/02/2007) Passive Smoke Exposure: no (06/30/2008)  Review of Systems      See HPI General:  Denies fever. GI:  Complains of constipation and gas; denies abdominal pain, bloody stools, dark tarry stools, hemorrhoids, nausea, and vomiting.  Physical Exam  General:  alert, well-developed, well-nourished, and well-hydrated.   Mouth:  MMM Abdomen:  Bowel sounds positive,abdomen soft and non-tender without masses, organomegaly or hernias noted. Extremities:  no edema bilaterally Psych:  normally interactive, good eye contact, not anxious appearing, and not depressed appearing.     Impression & Recommendations:  Problem # 1:  CONSTIPATION (ICD-564.00) Assessment New Resolved.  Discussed ways to prevent  and treat constipation, including adequate hydration, fiber intake.  Miralax as needed.  Colace is also gentle and can be use more regularly.  Complete Medication List: 1)  Atenolol 50 Mg Tabs (Atenolol) .... Take one by mouth daily 2)  Actos 15 Mg Tabs (Pioglitazone hcl) .... Take one by mouth daily 3)  Altace 10 Mg Caps (Ramipril) .... Take one by mouth two times a day 4)  Aldactone 25 Mg Tabs (Spironolactone) .... Take one by mouth daily 5)  Hydralazine Hcl 50 Mg Tabs (Hydralazine hcl) .... Take one by mouth two times a day 6)  Caduet 10-10 Mg  Tabs (Amlodipine-atorvastatin) .... Take one by mouth daily 7)  Pepcid 20 Mg Tabs (Famotidine) .... Take one by mouth daily 8)  Baby Aspirin Chew (Aspirin chew) .... Take one by mouth daily 9)  One-daily Multivitamins Tabs (Multiple vitamin) .... Take one by mouth daily 10)  Clonidine Hcl 0.1 Mg Tabs (Clonidine hcl) .... Take one by mouth at bedtime 11)  Clonidine Hcl 0.2 Mg Tabs (Clonidine hcl) .... Take one by mouth two times a day  Patient Instructions: 1)  Try Miralax (over the counter).  Current Allergies (reviewed today): No known allergies

## 2011-01-24 ENCOUNTER — Ambulatory Visit: Payer: Self-pay | Admitting: Cardiology

## 2011-03-10 ENCOUNTER — Encounter: Payer: Self-pay | Admitting: Cardiology

## 2011-03-10 ENCOUNTER — Encounter: Payer: Self-pay | Admitting: *Deleted

## 2011-03-10 ENCOUNTER — Ambulatory Visit (INDEPENDENT_AMBULATORY_CARE_PROVIDER_SITE_OTHER): Payer: Medicare Other | Admitting: Cardiology

## 2011-03-10 DIAGNOSIS — I509 Heart failure, unspecified: Secondary | ICD-10-CM

## 2011-03-10 DIAGNOSIS — I10 Essential (primary) hypertension: Secondary | ICD-10-CM

## 2011-03-10 DIAGNOSIS — I739 Peripheral vascular disease, unspecified: Secondary | ICD-10-CM

## 2011-03-10 DIAGNOSIS — R011 Cardiac murmur, unspecified: Secondary | ICD-10-CM

## 2011-03-11 ENCOUNTER — Encounter: Payer: Self-pay | Admitting: Cardiology

## 2011-03-11 NOTE — Assessment & Plan Note (Signed)
We'll increase her medicines to clonidine 0.3 mg b.i.d. And hydralazine 100 mg b.i.d.

## 2011-03-11 NOTE — Progress Notes (Signed)
Subjective:   Adrienne Bowman is seen today for followup of her coronary artery disease and hypertension. She's had extensive peripheral vascular disease with a left above-the-knee amputation. In general, she's doing well. Shestays active in church. Overall, she's been feeling better.  In 2002, she presented with acute congestive heart failure while getting lower extremity arteriogram. This led to coronary artery bypass grafting x6 by Dr. Laneta Simmers. She had a free left internal mammary artery graft to the LAD, a saphenous vein graft to diagonal, a sequential saphenous vein graft the first and second obtuse marginal branches, and a sequential saphenous vein graft to the distal right coronary artery posterior descending vessel. She has done well from a cardiac standpoint since that time.  She has had hypertension for over 35 years. She's had previous laser surgery on her left eye in 1987 again in 1988. She's had multiple peripheral vascular and surgeries. She's had non-insulin-dependent diabetes mellitus, renal insufficiency, anemia, above the knee amputation, and previous renal artery stents. She does have chronic renal insufficiency and a history of anemia.  Current Outpatient Prescriptions  Medication Sig Dispense Refill  . amlodipine-atorvastatin (CADUET) 10-10 MG per tablet Take 1 tablet by mouth daily.        [name] aspirin 81 MG tablet Take 81 mg by mouth daily.        [name] atenolol (TENORMIN) 50 MG tablet Take 50 mg by mouth daily.        . cloNIDine (CATAPRES) 0.1 MG tablet Take 0.1 mg by mouth at bedtime.        . cloNIDine (CATAPRES) 0.2 MG tablet Take 0.2 mg by mouth 2 (two) times daily.        . famotidine (PEPCID) 20 MG tablet Take 20 mg by mouth daily.        . hydrALAZINE (APRESOLINE) 50 MG tablet Take 50 mg by mouth 2 (two) times daily.        . multivitamin (THERAGRAN) per tablet Take 1 tablet by mouth daily.        . pioglitazone (ACTOS) 15 MG tablet Take 15 mg by mouth daily.        . ramipril  (ALTACE) 10 MG capsule Take 10 mg by mouth daily.        [name] spironolactone (ALDACTONE) 25 MG tablet Take 25 mg by mouth daily.          No Known Allergies  Patient Active Problem List  Diagnoses  . DIABETES MELLITUS, TYPE II  . NEPHROPATHY, DIABETIC  . HYPERTENSION  . MYOCARDIAL INFARCTION, HX OF  . CONGESTIVE HEART FAILURE  . PULMONARY EDEMA  . CONSTIPATION  . HEMOCCULT POSITIVE STOOL  . CHRONIC KIDNEY DISEASE UNSPECIFIED  . PVD (peripheral vascular disease)  . Murmur, heart    History  Smoking status  . Never Smoker   Smokeless tobacco  . Never Used    History  Alcohol Use No    Family History  Problem Relation Age of Onset  . Coronary artery disease      prevalent in sibling  . Heart disease Mother     Review of Systems:   The patient denies any heat or cold intolerance.  No weight gain or weight loss.  The patient denies headaches or blurry vision.  There is no cough or sputum production.  The patient denies dizziness.  There is no hematuria or hematochezia.  The patient denies any muscle aches or arthritis.  The patient denies any rash.  The patient denies frequent  falling or instability.  There is no history of depression or anxiety.  All other systems were reviewed and are negative.   Physical Exam:   Vital signs are reviewed. Weight is 117. She has a left AKA amputation.The head is normocephalic and atraumatic.  Pupils are equally round and reactive to light.  Sclerae nonicteric.  Conjunctiva is clear.  Oropharynx is unremarkable.  There's adequate oral airway.  Neck is supple there are no masses.  Thyroid is not enlarged.  There is no lymphadenopathy.  Lungs are clear.  Chest is symmetric.  Heart shows a regular rate and rhythm.  S1 and S2 are normal.  There is a grade 2/6 murmur of aortic insufficiency as well as a grade 2 systolic outflow murmur.  Abdomen is soft normal bowel sounds.  There is no organomegaly.  Genital and rectal deferred.  .  Peripheral pulses  are adequate.  Neurologically intact.  Full range of motion.  The patient is not depressed.  Skin is warm and dry.  Assessment / Plan:

## 2011-03-11 NOTE — Assessment & Plan Note (Signed)
Overall, she's been doing reasonably well. Her last echocardiogram was in 2004.we will see back on as-needed basis and I will have her see her family care and [facility] for followup of her hypertension.

## 2011-03-29 NOTE — Procedures (Signed)
BYPASS GRAFT EVALUATION   INDICATION:  Follow-up right lower extremity bypass graft.   HISTORY:  Diabetes:  Yes  Cardiac:  CABG October 2002  Hypertension:  Yes  Smoking:  No  Previous Surgery:  Right femoral-posterior tibial artery bypass graft  with non-reversed saphenous vein on 08/30/2001 by Dr. Hart Rochester.  Left AKA  11/05/2005 in [facility].   SINGLE LEVEL ARTERIAL EXAM                               RIGHT              LEFT  Brachial:                    182.               185.  Anterior tibial:             195.               AKA.  Posterior tibial:            197.  Peroneal:  Ankle/brachial index:        1.06.              AKA.   PREVIOUS ABI:  Date: 04/16/2007  RIGHT:  >1.0  LEFT:  AKA.   LOWER EXTREMITY BYPASS GRAFT DUPLEX EXAM:   DUPLEX:  Doppler arterial waveforms appear biphasic proximal to, within  and distal to bypass graft.   IMPRESSION:  Patent right femoral-anterior tibial artery bypass graft.  Right ABI appears stable.  No significant changes from previous study.    ___________________________________________  Quita Skye Hart Rochester, M.D.   AS/MEDQ  D:  11/06/2007  T:  11/06/2007  Job:  [id number]

## 2011-03-29 NOTE — Procedures (Signed)
BYPASS GRAFT EVALUATION   INDICATION:  Follow-up evaluation of lower extremity bypass graft.   HISTORY:  Diabetes:  Yes.  Cardiac:  CABG in 2002.  Hypertension:  Yes.  Smoking:  No.  Previous Surgery:  Right proximal superficial femoral to distal  posterior tibial artery bypass graft on 08/30/01, left above-knee  amputation on 11/05/05.   SINGLE LEVEL ARTERIAL EXAM                               RIGHT              LEFT  Brachial:                    234                233  Anterior tibial:             217  Posterior tibial:            225  Peroneal:  Ankle/brachial index:        0.96               AKA   PREVIOUS ABI:  Date: 10/29/08  RIGHT:  1.01  LEFT:  AKA   LOWER EXTREMITY BYPASS GRAFT DUPLEX EXAM:   DUPLEX:  1. Increased velocity of 233 cm/s noted at the right bypass graft      inflow artery, 259 cm/s noted at the distal bypass graft native      artery.  2. Biphasic duplex waveforms noted within the graft and native artery.   IMPRESSION:  1. Patent right superficial femoral to posterior tibial artery bypass      graft with no evidence of focal stenosis.  2. Normal ankle brachial index noted in the right lower extremity with      biphasic Doppler waveforms.   ___________________________________________  Quita Skye Hart Rochester, M.D.   AC/MEDQ  D:  05/08/2009  T:  05/08/2009  Job:  [id number]

## 2011-03-29 NOTE — Procedures (Signed)
BYPASS GRAFT EVALUATION   INDICATION:  Follow up right lower extremity bypass graft.   HISTORY:  Diabetes:  Yes.  Cardiac:  CABG.  Hypertension:  Yes.  Smoking:  No.  Previous Surgery:  Right proximal superficial femoral to distal  posterior tibial artery bypass graft on 08/30/2001, left above-the-knee  amputation in 2006.   SINGLE LEVEL ARTERIAL EXAM                               RIGHT              LEFT  Brachial:                    182                190  Anterior tibial:             Not detected  Posterior tibial:            187  Peroneal:                    186  Ankle/brachial index:        0.98   PREVIOUS ABI:  Date: 05/08/2009  RIGHT:  0.96  LEFT:   LOWER EXTREMITY BYPASS GRAFT DUPLEX EXAM:   DUPLEX:  Biphasic Doppler waveforms noted throughout the right lower  extremity bypass graft and its native vessels.  There is a mildly  increased velocity of 202 cm/s noted in the retrograde right posterior  tibial artery superior to the distal anastomosis.  This increase in  velocity appears to be most likely due to a change in the vessel  diameter in the angle of vessel take-off.   IMPRESSION:  1. Patent right superficial femoral to posterior tibial artery bypass      graft with increased velocity noted, as described above.  2. Stable right ankle brachial index.   ___________________________________________  Quita Skye Hart Rochester, M.D.   CH/MEDQ  D:  07/22/2010  T:  07/22/2010  Job:  [id number]

## 2011-03-29 NOTE — Procedures (Signed)
BYPASS GRAFT EVALUATION   INDICATION:  Follow-up right lower extremity bypass graft.   HISTORY:  Diabetes:  Yes.  Cardiac:  CABG in 2002.  Hypertension:  Yes.  Smoking:  No.  Previous Surgery:  Right proximal superficial femoral to distal  posterior tibial artery bypass graft on 08/30/2001, left above-knee  amputation on 11/05/2005.   SINGLE LEVEL ARTERIAL EXAM                               RIGHT              LEFT  Brachial:                    212                208  Anterior tibial:             211  Posterior tibial:            215  Peroneal:  Ankle/brachial index:        1.01               AKA   PREVIOUS ABI:  Date: 11/06/2007  RIGHT:  1.06  LEFT:  AKA   LOWER EXTREMITY BYPASS GRAFT DUPLEX EXAM:   DUPLEX:  Biphasic Doppler waveforms noted throughout the right lower  extremity bypass graft and its native vessels with a mildly increased  velocity of 207 cm/s noted in the proximal superficial femoral artery.   IMPRESSION:  1. Patent right superficial femoral-to-posterior tibial artery bypass      graft with no evidence of stenosis.  2. Mildly increase velocity noted in the right proximal superficial      femoral artery, as described above.  3. Stable right ankle brachial index noted.   ___________________________________________  Quita Skye Hart Rochester, M.D.   CH/MEDQ  D:  10/30/2008  T:  10/30/2008  Job:  [id number]

## 2011-04-01 NOTE — Consult Note (Signed)
[facility]  Patient:    Adrienne Bowman, Adrienne Bowman Visit Number: [id number] MRN: [id number]          Service Type: FTC Location: FOOT Attending Physician:  Sharren Bridge Dictated by:   Jonelle Sports Cheryll Cockayne, M.D. Proc. Date: 11/01/01 Admit Date:  11/01/2001   CC:         Quita Skye. Hart Rochester, M.D.   Consultation Report  HISTORY:  This 77 year old black female has a complicated history of right lower extremity problems related to proximal vascular obstruction with very poor run-off as well as diabetes and hypertensive vascular disease.  In October 2002 the patient underwent coronary artery bypass grafting and in addition had some vascular procedure of the right lower extremity in effort to salvage that extremity.  She, nonetheless, developed gangrene in the first and second toes and could not be salvaged and accordingly underwent a ray amputation of the first and second toes on that foot October 17.  Since that time she has had a chronic unhealing wound without gross infection and has been followed by Dr. Hart Rochester.  He refers her here now for consideration of wound vac therapy to the area.  She has not currently been on antibiotic therapy and has been stable without systemic symptoms.  PAST MEDICAL HISTORY:  Myocardial infarction as previously indicated followed by CABG.  She does have a history of congestive heart failure.  ALLERGIES:  No known drug allergies.  MEDICATIONS:  Actos, atenolol, enteric-coated aspirin, clonidine, Norvasc, potassium chloride, hydrochlorothiazide, oxycodone.  PHYSICAL EXAMINATION  EXTREMITIES:  Distal right lower extremity:  There is evidence of surgical absence of the first and second toes and metatarsal heads with an open surgical wound on the dorsum of the foot in that area.  The skin temperature on that side is 90.2 on the dorsum of the foot, 89.4 on the plantar aspect. The remaining toes (3, 4, and 5) are without significant  lesions.  Pulses are not palpable, but can be obtained with the Doppler.  Monofilament testing shows that her sensation is intact.  DISPOSITION: 1. The patients wound is debrided by the sharp removal of some fatty tissue    in the base and there seemed to be particularly some recesses, but not    frank sinus tracts at both ends of the wound, one right at the base of the    third toe on that side and the other at the medial aspect of the wound    where it overlies the first metatarsal shaft.  These are probed and there    is no evidence of deep penetration or deep infection. 2. Follow such debridement the patient is placed in a wound vac which is set    at 125 mm negative pressure and is well tolerated. 3. The patient will be followed by home health for appropriate changes of the    wound vac. 4. She will be seen here in 14 days for reevaluation, sooner if needed. Dictated by:   Jonelle Sports Cheryll Cockayne, M.D. Attending Physician:  Sharren Bridge DD:  11/01/01 TD:  11/02/01 Job: [id number]

## 2011-04-01 NOTE — Procedures (Signed)
Adrienne Bowman. [facility]  Patient:    [name], [name] Visit Number: [id number] MRN: [id number]          Service Type: Attending:  Quita Skye. Hart Rochester, M.D. Dictated by:   Quita Skye Hart Rochester, M.D. Proc. 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  PROCEDURE:  Right femoral angiogram with right lower extremity runoff.  SURGEON:  Quita Skye. Hart Rochester, M.D.  ANESTHESIA:  Local Xylocaine.  DESCRIPTION OF PROCEDURE:  The patient was taken to the [facility] peripheral endovascular lab, placed in the supine position, at which time the right groin was prepped with Betadine solution, draped in the routine sterile manner. After infiltration with 1% Xylocaine, the right common femoral artery was entered percutaneously, a guidewire passed into the distal aorta, and a 5 French sheath and dilator passed over the guidewire and dilator removed. Standard right lower extremity angiogram was performed, injecting 40 cc of contrast at 6 cc per second.  This revealed the right external iliac, common femoral, and profunda femoris arteries to be patent. The superficial femoral artery was diffusely diseased and was totally occluded in its distal third with reconstitution of the popliteal artery at the knee level.  The popliteal artery was relatively smooth below the knee, but all tibial vessels were totally occluded at their origin.  There was some late reconstitution of a segment of what appeared to be the posterior tibial artery in the mid-lower leg.  To get better visualization, additional views were performed using the peek-hold technique.  This revealed late reconstitution of the diseased posterior tibial artery just above the medial malleolus and filling the foot but all other tibial vessels occluded at the ankle level.  Having tolerated the procedure well, the  sheath was removed, adequate compression applied, and no complications occurred.  FINDINGS: 1. Right superficial femoral occlusion with reconstitution of popliteal artery    at the knee level. 2. Total occlusion of all tibial vessels with segmental reconstitution of the    diseased posterior tibial artery in the mid-lower leg. 3. Late reconstitution of right posterior tibial artery at the ankle level    with severe disease in the foot. Dictated by:   Quita Skye Hart Rochester, M.D. Attending:  Quita Skye. Hart Rochester, M.D. DD:  08/30/01 TD:  08/30/01 Job: [id number]

## 2011-04-01 NOTE — H&P (Signed)
[place]. [facility]  Patient:    Adrienne Bowman, Adrienne Bowman Visit Number: [id number] MRN: [id number]          Service Type: Attending:  Quita Skye. Hart Rochester, M.D. Dictated by:   Durenda Age, P.A.-C. Adm. Date:  09/05/01   CC:         Colleen Can. Deborah Chalk, M.D.  Alleen Borne, M.D.   History and Physical  DATE OF BIRTH:  Feb 08, 1918  CHIEF COMPLAINT:  Peripheral vascular occlusive disease.  HISTORY OF PRESENT ILLNESS:  An 77 year old black female, a patient of Dr. Hart Rochester, with known history of PVOD.  The patient was scheduled to have an angiogram in anticipation for right FEM-POP bypass graft on August 09, 2001.  Prior to the procedure, the patient developed increasing shortness of breath, complicated with CHF--pulmonary edema.  The angiogram was aborted.  Cardiac evaluation was obtained.  Cardiac enzymes were positive for non-Q wave MI and severe three-vessel CAD.  On August 16, 2001, the patient underwent CABG x 6 by Dr. Laneta Simmers.  She was discharged from the hospital on August 22, 2001.  She is now stable, from the surgical standpoint.  Once discharged, the patient was instructed to reschedule peripheral vascular evaluation, since she has only 43% right ABI.  A new attempt for arteriogram will be taking place on August 30, 2001.  Today, the patient complains of right ankle/foot pain, which is present at rest and at night.  Also, gangrenous changes are seen in the right first and second toe.  These areas have as well ischemic changes and peripheral edema.  She denies any buttock, hip, or side pain.  No calf pain.  No shortness of breath or dyspnea on exertion.  No chest pain or palpitations.  PAST MEDICAL HISTORY:  PVOD, as above, and IDDM.  CAD, status post CABG. Status post MI on August 09, 2001.  Hypertension.  Recent history of ARDS. History of CHF.  Ejection fraction 50-55%.  Anemia.  GERD.  Recent history of acute renal insufficiency,  controlled.  PAST SURGICAL HISTORY:  Status post CABG x 6 on August 16, 2001; Dr. Laneta Simmers. Status post aborted peripheral angiogram on August 09, 2001; Dr. Hart Rochester. Status post left eye cataract surgery.  MEDICATIONS: 1. Atenolol 50 mg q.d. 2. Actos 50 mg q.d. 3. Aspirin 325 mg q.d. 4. Pepcid 20 mg q.12h. 5. Clonidine 0.2 mg 1 patch q.w. 6. Norvasc 10 mg q.d. 7. Lasix 40 mg q.d. 8. Kay Ciel 20 mEq q.d.  ALLERGIES:  No known drug allergies.  REVIEW OF SYSTEMS:  See HPI and past medical history for significant positives.  FAMILY HISTORY:  Mother died at 28 from CVA.  Father died of lung cancer.  Two brothers died of unknown causes.  SOCIAL HISTORY:  Widowed, three children.  She is retired.  She denies any tobacco or alcohol intake.  She is a poor historian.  PHYSICAL EXAMINATION:  GENERAL:  This is a thin, frail 77 year old black female in no acute distress. Alert and oriented x 3.  VITAL SIGNS:  Blood pressure 160/70, pulse 100, respirations 18.  HEENT:  Normocephalic, atraumatic.  PERRLA.  EOMI.  Funduscopic exam reveals cataracts.  NECK:  Supple.  No JVD, bruits, or lymphadenopathy.  CHEST:  Symmetrical on inspiration.  There is a well-healed sternotomy site. Lungs showed no wheezes.  There is mild atelectasis bilaterally with left lower lobe mild rales.  No lymphadenopathy.  CARDIOVASCULAR:  Regular rate and rhythm.  2/6 systolic murmur, loud S2.  S4 gallop.  No rubs.  ABDOMEN:  Soft, nontender.  Bowel sounds x 4.  No masses or bruits.  GENITOURINARY/RECTAL:  Deferred.  EXTREMITIES:  No clubbing, 2+ edema on the left, 1+ edema on the right.  In the left groin towards the left leg, there is a well-healing venectomy site with mild drainage without odor.  The staples are being removed on August 28, 2001 at the office.  In the right second toe, there are gangrenous changes, highly odorous, and tender to palpation.  In the right great toe, there are severely  ischemic changes with the medial aspect of the great toe showing gangrenous changes as well.  This area, as well, is highly odorous and tender.  Moreover, there is a significant area of tenderness at the right transmetatarsal area.  Peripheral pulses:  Carotid and femoral 2+ bilaterally. Popliteal 1+ on the left, absent on the right.  Dorsalis pedis and posterior tibialis absent bilaterally.  NEUROLOGIC:  Nonfocal.  DTRs 2+.  Muscle strength 5+.  ASSESSMENT AND PLAN:  Peripheral vascular occlusive disease, for arteriogram as an outpatient on August 30, 2001 secondary to an ischemic right leg.  She will undergo right femoral-popliteal bypass graft on September 05, 2001 for limb salvage.  Dr. Hart Rochester has seen and evaluated this patient prior to the admission and has explained the risks and benefits involved in the procedure and the patient has agreed to continue. Dictated by:   Durenda Age, P.A.-C. Attending:  Quita Skye. Hart Rochester, M.D. DD:  08/28/01 TD:  08/28/01 Job: [id number]

## 2011-04-01 NOTE — Cardiovascular Report (Signed)
[place]. [facility]  Patient:    Adrienne Bowman, Adrienne Bowman Visit Number: [id number] MRN: [id number]          Service Type: MED Location: 2000 2014 01 Attending Physician:  Colvin Caroli Proc. Date: 08/13/01 Admit Date:  08/09/2001   CC:         Quita Skye. Hart Rochester, M.D.  Eric L. August Saucer, M.D.   Cardiac Catheterization  DATE OF BIRTH:  [birth year]  HISTORY:  Ms. Rochele Raring presented with congestive heart failure prior to angiogram for peripheral vascular disease and a nonhealing ulcer on her right leg.  She had a small rise in her cardiac enzymes.  Congestive heart failure resolved and she was referred for catheterization.  NAME OF PROCEDURES: 1. Left heart catheterization with, 2. Selective coronary angiography, and; 3. Left ventricular angiography.  CARDIOLOGIST:  Colleen Can. Deborah Chalk, M.D.  TYPE AND SITE OF ENTRY:  Percutaneous right femoral artery.  CATHETERS:  Six [place], 4-Judkins right and left coronary catheters, 6 French pigtail ventriculographic catheter.  CONTRAST MATERIAL:  Omnipaque.  MEDICATIONS GIVEN PRIOR TO THE PROCEDURE:  Valium 10 mg p.o.  MEDICATIONS GIVEN DURING THE PROCEDURE:  None.  COMMENTS:  The patient tolerated the procedure well.  HEMODYNAMIC DATA:  The _____ 180/90.  LV pressure was 180/15-23.  There was no aortic valve gradient noted on pullback.  ANGIOGRAPHIC DATA: #1 Left Main Coronary Artery:  Left main coronary artery has a 50% distal narrowing.  #2 Left Circumflex:  Left circumflex continues primarily through the posterolateral branch.  There is a small proximal obtuse marginal with ostial stenosis.  There is sequential 50-60% narrowing in the body of the left circumflex and AV groove, then the vessel bifurcates at the bifurcation as it extends to the posterolateral branch.  There is 60-70% stenosis in the origin of both of these distal posterolateral branches.  #3 Left Anterior Descending:  The left anterior  descending is a reasonably large vessel that wraps around the apex.  It has extensive calcification proximally and an 80% focal stenosis within an area of diffuse 70% segmental stenosis.  There is a proximal first diagonal vessel with segmental 90% narrowing, although it is a small vessel.  There is a somewhat larger second diagonally distally.  #4 Right Coronary Artery:  The right coronary artery damps when the 6 French catheter enters it.  This would be approximately a 60% ostial narrowing.  In the midportion of the right coronary artery there is a 90% narrowing.  This stenosis is approximately 2 cm in length.  The distal portion of the right coronary artery just beyond the stenosis is approximately 3 mm in diameter and this area could be suitable for stent placement.  The distal right coronary artery is of moderate size with diffuse atherosclerosis, but no real significant focal obstructive process and would be suitable for bypass grafting.  LEFT VENTRICULAR ANGIOGRAM:  Left ventricular angiogram was performed in the RAO position.  Overall cardiac size and silhouette are normal.  There is mild anterior hypokinesis.  The global ejection fraction is probably 45-50%.  There is no intracardiac calcification or intracavitary filling defect, but there is coronary calcification present.  OVERALL IMPRESSION: 1. Mild anterior hypokinesis with mild reduction in left ventricular function. 2. Severe three-vessel coronary disease (90% right coronary artery, 50% left    main stenosis, 80-90% left anterior descending and 70% left circumflex).  DISCUSSION:  The patient will be referred for consideration for coronary artery bypass grafting. Attending Physician:  Hart Rochester,  Rosalene Billings DD:  08/13/01 TD:  08/13/01 Job: [id number]

## 2011-04-01 NOTE — Consult Note (Signed)
[place]. [facility]  Patient:    Adrienne Bowman, Adrienne Bowman Visit Number: [id number] MRN: [id number]          Service Type: DSU Location: MICU 2115 01 Attending Physician:  Colvin Caroli Dictated by:   Colleen Can. Deborah Chalk, M.D. Proc. Date: 08/09/01 Admit Date:  08/09/2001   CC:         Quita Skye. Hart Rochester, M.D.  Cammy Copa, M.D.   Consultation Report  REASON FOR CONSULTATION:  Acute onset of congestive heart failure.  HISTORY OF PRESENT ILLNESS:  Adrienne Bowman is an 77 year old female with a nonhealing ulcer on her right foot.  She was referred for elective arteriogram.  She was basically stable before the procedure began, then developed more shortness of breath, really as she was being put on the table. After the arterial stick was made, she had more shortness of breath and the procedure was basically terminated.  She had only a small amount of contrast. Checked position of the catheter, and then it was elected to not give her a full bolus.  She had more shortness of breath, dyspnea, orthopnea.  She had no frank chest pain at the time.  She has a history of long-standing hypertension and about nine years of diabetes.  PAST MEDICAL HISTORY: Hypertension, diabetes x 9 years, right second toe is ulcerated.  She has peripheral vascular disease, gastroesophageal reflux.  ALLERGIES:  None known.  MEDICATIONS:  Atenolol 50 mg a day, labetalol 200 mg a day, Actos 15 mg per day, and p.r.n. pain medicine.  FAMILY HISTORY:  Basically noncontributory.  SOCIAL HISTORY:  She lives alone.  No smoking or alcohol.  REVIEW OF SYSTEMS:  No chest pain.  She does have uterine prolapse.  No history of asthma.  She has basically been doing well, able to be around, do things, no significant GI or GU complaints.  History of gastroesophageal reflux.  She has had no seizures, strokes, or faints.  PHYSICAL EXAMINATION:  VITAL SIGNS:  Blood pressure is 215/98 standing,  heart rate in the 90s, respiratory rate is 28, O2 saturations are 88 and increasing to 94-96 as therapy progressed.  GENERAL:  She is in respiratory distress.  SKIN:  Warm and dry.  Color is basically within normal limits.  Skin was mildly moist.  NECK:  Neck veins were elevated.  CHEST:  Lungs show audible rales and wheezes.  CARDIAC:  Heart showed an S4, no murmur, and no S3 was appreciated.  ABDOMEN:  Soft.  There was a left femoral artery catheter in place.  GENITOURINARY:  Genitalia showed uterine prolapse, which was marked.  EXTREMITIES:  1+ edema, no pulses.  LABORATORY DATA:  A pH of 7.39, PCO2 of 39, PO2 of 86.  Hematocrit was 29, white count was 6800.  BUN is 26, creatinine 1.2, potassium of 4.4, glucose was 203.  Chest x-ray on September 25 showed cardiomegaly with Kerley B-lines.  EKG done today showed LVH with ST change.  OVERALL IMPRESSION: 1. Acute respiratory failure and congestive heart failure. 2. History of hypertension. 3. Noninsulin-dependent diabetes mellitus. 4. Peripheral vascular disease with ulceration on the right foot.  PLAN:  Will transfer to the ICU, continue IV nitroglycerin and IV Lasix, use labetalol to control her pressure as well as start her on her medicines.  Will add an ACE inhibitor.  Need to follow labs and rule out myocardial infarction, make sure we do not have renal insufficiency as we progress.  Will check a 2 D  echo.  She will probably need cardiac catheterization before she has any peripheral vascular surgery. Dictated by:   Colleen Can Deborah Chalk, M.D. Attending Physician:  Colvin Caroli DD:  08/09/01 TD:  08/09/01 Job: [id number]

## 2011-04-01 NOTE — Op Note (Signed)
[place]. [facility]  Patient:    Adrienne Bowman, Adrienne Bowman Visit Number: [id number] MRN: [id number]          Service Type: MED Location: 2300 2302 [id number] Attending Physician:  Cleatrice Burke Dictated by:   Alleen Borne, M.D. Proc. Date: 08/16/01 Admit Date:  08/09/2001   CC:         Colleen Can. Deborah Chalk, M.D.  [facility]   Operative Report  PREOPERATIVE DIAGNOSIS:  Severe three-vessel coronary artery disease, status post acute non-Q-wave myocardial infarction.  POSTOPERATIVE DIAGNOSIS:  Severe three-vessel coronary artery disease, status post acute non-Q-wave myocardial infarction.  PROCEDURES:  Median sternotomy, extracorporeal circulation, coronary artery bypass graft surgery x 6, using a free left internal mammary artery graft to the left anterior descending coronary artery, with a saphenous vein graft to the diagonal branch of the left anterior descending coronary artery, a sequential saphenous vein graft to the first and second obtuse marginal branches of the left circumflex coronary arteries, and a sequential saphenous vein graft to the distal right coronary artery and then the posterior descending branch of the right coronary artery.  SURGEON:  Alleen Borne, M.D.  ASSISTANTS:  Salvatore Decent. Dorris Fetch, M.D., and [place], P.A.  ANESTHESIA:  General endotracheal.  CLINICAL HISTORY:  This patient is an 77 year old woman with a history of hypertension, diabetes, and peripheral vascular disease, who was admitted on August 10, 2001, for an elective arteriogram for lower extremity ischemia with a nonhealing ulcer of the right first and second toe.  At the beginning of that procedure the patient developed shortness of breath with decreased O2 saturation to 82% and pulmonary congestion and disorientation.  She was markedly hypertensive.  The arteriogram was aborted, and she was transferred to the ICU for treatment of her  hypertension and congestive heart failure. She rapidly improved.  She ruled in for a non-Q-wave myocardial infarction with a peak CPK of 286, with an MB fraction of 18.6 and a positive troponin level of 0.77.  An echocardiogram showed left ventricular ejection fraction of 50-55% with moderate hypokinesis of the mid-distal septal wall.  There was mild mitral regurgitation.  She underwent cardiac catheterization, which showed about 50% distal left main stenosis.  The left main and proximal coronary arteries were heavily calcified.  The LAD had diffuse proximal disease of 70-90%.  There was a first diagonal with a 95% stenosis.  The left circumflex gave off a first marginal that had 70% stenosis.  There was a second marginal or posterolateral branch that had 60-70% stenosis.  The right coronary artery had 60-70% ostial stenosis and a long 90% midvessel stenosis. There was also some disease in the proximal portion of the posterior descending coronary artery.  Ejection fraction was noted to be 45-50%.  After review of the angiogram and examination of the patient, it was felt that coronary artery bypass graft surgery was the best treatment.  I discussed the operative procedure with the patient and her family, including alternatives, benefits, and risks, including bleeding, possible blood transfusion, infection, stroke, myocardial infarction, and death.  They understood and agreed to proceed.  DESCRIPTION OF PROCEDURE:  The patient was taken to the operating room and placed on the table in supine position.  After induction of general endotracheal anesthesia, a Foley catheter was placed in the bladder using sterile technique.  Then the chest, abdomen, and both lower extremities were prepped and draped in the usual sterile manner.  Aprotinin was used for this  case.  Then the chest was opened through a median sternotomy incision and the pericardium opened in the midline.  Examination of the heart  showed good ventricular contractility.  The ascending aorta had no palpable plaques in it.  Then the left internal mammary artery was harvested from the chest wall as a pedicle graft.  This was a medium-caliber vessel with excellent blood flow through it.  It was relatively short, and I did not feel that it would be long enough to reach to the distal LAD where the LAD was graftable without tension on it, especially with her large lungs.  Therefore, I planned to use this as a free graft.  At the same time, a segment of greater saphenous vein was harvested from the left thigh.  This vein was of medium size and good quality.  Then the patient was heparinized and when an adequate activated clotting time was achieved, the distal ascending aorta was cannulated using a 20 French aortic cannula for arterial inflow.  Venous outflow was achieved using a two-stage venous cannula through the right atrial appendage.  An antegrade cardioplegia and vent cannula was inserted in the aortic root.  The patient was placed on cardiopulmonary basis and the distal coronaries identified.  She had diffuse coronary plaque but graftable distal vessels. The LAD was intramyocardial in its proximal one-half and then exited toward the surface of the heart.  The distal half of the vessel was diffusely diseased, but there was an area distally that was soft enough to graft.  The diagonal branch was a small but graftable vessel.  The first and second marginal vessels were medium-sized, graftable vessels.  The right coronary artery had a medium-sized posterior descending branch that had heavy proximal disease in it.  The posterolateral branches were small vessels that were not suitable for grafting.  Due to this posterior descending stenosis, I felt that it would be best to graft the distal right coronary artery just before the takeoff of the posterior descending branch and then carry the graft out onto the posterior  descending branch itself.   Then the aorta was crossclamped and 500 cc of cold blood antegrade cardioplegia was administered in the aortic root.  There was quick arrest of the heart.  Systemic hypothermia at 20 degrees Centigrade and topical hypothermia with iced saline was used.  A temperature probe was placed in the septum and the insulating pad in the pericardium.  The first distal anastomosis was performed to the first marginal branch.  The internal diameter was 1.6 mm.  The conduit used was a segment of greater saphenous vein and the anastomosis performed in a sequential side-to-side manner using continuous 7-0 Prolene suture.  Flow was measured through the graft and was excellent.  The second distal anastomosis was performed to the second marginal branch. The internal diameter was 1.6 mm.  The conduit used was the same segment of greater saphenous vein and the anastomosis performed in a sequential end-to-side manner using continuous 7-0 Prolene suture.  Flow was measured through the graft and was excellent.  Another dose of cardioplegia was given through the vein grafts and in the aortic root.  The third distal anastomosis was performed to the distal right coronary artery just before the takeoff of the posterior descending branch.  The internal diameter was greater than 3 mm.  The conduit used was a second segment of greater saphenous vein and the anastomosis performed in a sequential side-to-side manner using continuous 7-0 Prolene suture.  Flow  was measured through the graft and was excellent.  The fourth distal anastomosis was performed to the midportion of the posterior descending coronary artery.  The internal diameter of this vessel was 1.6 mm. The conduit used was the same segment of greater saphenous vein and the anastomosis performed in a sequential end-to-side manner using continuous 7-0 Prolene suture.  Flow was again measured through the graft and was  excellent. Another dose of cardioplegia was given down the vein grafts and in the aortic root.  The fifth distal anastomosis was performed to the diagonal branch.  The internal diameter was 1.5 mm.  The conduit used was a third segment of greater saphenous vein and the anastomosis performed in an end-to-side manner using continuous 7-0 Prolene suture.  Flow was measured through the graft and was excellent.  The sixth distal anastomosis was performed to the distal portion of the left anterior descending coronary artery.  The internal diameter was 1.75 mm.  The conduit used was the free left internal mammary graft, and this was anastomosed in an end-to-side manner using continuous 8-0 Prolene suture.  The pedicle was tacked to the epicardium with 6-0 Prolene sutures.  The patient was rewarmed to 37 degrees Centigrade and the clamp removed from the mammary pedicle.  There was rapid flow of blood seen down the LAD and rewarming of the septum.  The crossclamp was then removed with a time of 68 minutes and the patient defibrillated into sinus rhythm.  A partial occlusion clamp was placed on the aortic root, and the three proximal vein graft anastomoses were performed in an end-to-side manner using continuous 6-0 Prolene suture.  Then the left internal mammary graft was detached proximally close to the left subclavian artery and the end ligated with a 2-0 silk suture.  The end of the left mammary graft was then anastomosed to the hood of the diagonal vein graft in an end-to-side manner using continuous 7-0 Prolene suture.  The clamp was removed, the vein grafts de-aired, and the clamps removed from them.  The proximal and distal anastomoses appeared hemostatic and the alignment of the grafts satisfactory. Graft markers were placed around the proximal anastomoses.  Two temporary right ventricular and right atrial pacing wires were placed and brought out through the skin.  When the patient  had rewarmed to 37 degrees Centigrade, she was weaned from cardiopulmonary bypass on no inotropic agents.  Total bypass time was 143 minutes.  Cardiac function appeared excellent with a cardiac output of 5 L/min.  Protamine was given, and the venous and aortic cannulas were removed without difficulty.  Hemostasis was achieved.  Three chest tubes were placed with two in the posterior pericardium, one in the left pleural space, and one in the anterior mediastinum.  The pericardium was reapproximated over the heart.  The sternum was closed with #6 stainless steel wires.  Fascia was closed with continuous #1 Vicryl suture.  Subcutaneous tissue was closed with continuous 2-0 Vicryl and the skin with 3-0 Vicryl subcuticular closure.  The sponge, needle, and instruments were correct according to the scrub nurse. Dry sterile dressings were applied over the incisions and around the chest tubes, which were hooked to Pleuravac suction.  The patient remained hemodynamically stable and was transported to the SICU in guarded but stable condition. Dictated by:   Alleen Borne, M.D. Attending Physician:  Cleatrice Burke DD:  08/16/01 TD:  08/17/01 Job: [id number]

## 2011-04-01 NOTE — Discharge Summary (Signed)
[place]. [facility]  Patient:    LEXEY, FLETES Visit Number: [id number] MRN: [id number]          Service Type: MED Location: 2000 2037 [id number] Attending Physician:  Cleatrice Burke Dictated by:   Adair Patter, P.A. Admit Date:  08/09/2001 Disc. Date: 08/22/01   CC:         Quita Skye. Hart Rochester, M.D.  Colleen Can. Deborah Chalk, M.D.   Discharge Summary  DATE OF BIRTH:  [birth year]  ADMISSION DIAGNOSIS:  Coronary artery disease.  SECONDARY DIAGNOSES: 1. Peripheral vascular disease. 2. Hypertension. 3. Gastroesophageal reflux disease. 4. Non-insulin-dependent diabetes mellitus. 5. Anemia. 6. Congestive heart failure. 7. Pulmonary edema.  DISCHARGE DIAGNOSES: 1. Peripheral vascular disease. 2. Coronary artery disease.  HOSPITAL PROCEDURES: 1. Cardiac catheterization. 2. Pre-CABG Dopplers. 3. Bilateral carotid duplexes. 4. Coronary artery bypass graft x 6. 5. Acute renal insufficiency.  HOSPITAL COURSE:  Mrs. Hume is a patient well known to the CVTS office.  She has a history of peripheral vascular disease.  She was scheduled to have an angiogram in anticipation of right lower extremity bypass by Dr. Hart Rochester on August 09, 2001.  Just prior to undergoing angiography, the patient developed shortness of breath and pulmonary edema.  Because of this, the patients angiogram was cancelled and cardiology was consulted.  The patients symptoms improved with aggressive diuresis.  The patient was ruled in for a myocardial infarction by enzymes and was scheduled to undergo cardiac catheterization.  The patient was found to be anemic, however, before this. Because of this, GI consultation was obtained; however, no active source of bleeding was found.  The patients anemia improved with transfusion of packed red blood cells.  When she was deemed stable, the patient underwent cardiac catheterization which revealed she had significant coronary artery  disease. Because of this, the patients lower extremity bypass was postponed and Dr. Laneta Simmers was consulted.  On August 16, 2001, Dr. Laneta Simmers performed a coronary bypass graft x 6 with a free left internal mammary artery anastomosed to left anterior descending artery and sequential saphenous vein graft to the first and second obtuse marginal arteries, a saphenous vein graft to the diagonal artery, and a sequential saphenous vein graft to the posterior descending and distal right coronary artery.  No complications were noted during this procedure.  Postoperatively, the patient had a hospital course complicated by acute renal insufficiency.  This was treated with aggressive loop diuretics and renal-dosed dopamine.  Eventually, the patients creatinine returned back to baseline and she began to produce adequate amounts of urine.  The remainder of her postoperative course remained uneventful and she was subsequently discharged home in stable condition on August 22, 2001.  DISCHARGE MEDICATIONS: 1. Tylox 1-2 tablets every 4-6 hours as needed for pain. 2. Aspirin 325 mg 1 daily. 3. Pepcid 20 mg 1 tablet twice daily. 4. Actos 15 mg 1 daily. 5. Atenolol 50 mg 1 daily. 6. Clonidine 0.2 mg per 24-hour patch.  The patient was supposed to apply 1    patch weekly and remove old patch before applying new one. 7. Norvasc 10 mg 1 tablet daily. 8. Lasix 40 mg 1 daily. 9. Potassium chloride 20 mEq 1 daily.  DISCHARGE ACTIVITY:  The patient was told to avoid driving, strenuous activity, lifting heavy objects.  She was told to walk daily and to continue to use her incentive spirometer every hour.  DISCHARGE DIET:  1800 calorie ADA diet.  WOUND CARE:  The patient was told she  could shower and clean her incision with soap and water.  In addition, she will have home health nursing visit her daily to do dressing changes to her lower extremity.  DISPOSITION:  Home.  FOLLOW-UP:  The patient was told to call  her cardiologist, Dr. Roger Shelter for an appointment in two weeks.  She was told to bring x-ray to Dr. Garen Grams office at time of appointment with her.  She will see Dr. Laneta Simmers at the [facility] office on Tuesday, October 29, at 9:15 a.m.  In addition, she will also see Dr. Hart Rochester at the [facility] office on Tuesday, October 15, at 9 oclock a.m. and she will have an angiogram rescheduled in anticipation for a right lower extremity bypass in the near future. Dictated by:   Adair Patter, P.A. Attending Physician:  Cleatrice Burke DD:  08/21/01 TD:  08/21/01 Job: [id number]

## 2011-04-01 NOTE — Consult Note (Signed)
[place]. [facility]  Patient:    Adrienne Bowman, Adrienne Bowman Visit Number: [id number] MRN: [id number]          Service Type: MED Location: 2000 2014 01 Attending Physician:  Colvin Caroli Dictated by:   Alleen Borne, M.D. Proc. Date: 08/13/01 Admit Date:  08/09/2001   CC:         Colleen Can. Deborah Chalk, M.D.                          Consultation Report  REFERRING PHYSICIAN: Colleen Can. Deborah Chalk, M.D.  REASON FOR CONSULTATION: Left main and severe three-vessel coronary disease, status post acute non-Q wave myocardial infarction.  HISTORY OF PRESENT ILLNESS: This patient is an 77 year old woman, who was admitted on August 10, 2001 for an elective arteriogram for lower extremity ischemia, with a nonhealing right second toe ulcer.  At the beginning of that procedure the patient developed shortness of breath with decreased oxygen saturation at 82%, with pulmonary congestion and disorientation.  She was markedly hypertensive.  The catheterization was aborted after only 10 cc of contrast was given.  She was transferred to the ICU and treated for hypertension and congestive heart failure.  She rapidly improved.  She did rule in for a non-Q wave myocardial infarction, with a peak CPK of 286 with an MB fracture of 18.6 and a positive troponin of 0.77.  An echocardiogram was performed which showed left ventricular ejection fraction of 50-55%, with moderate hypokinesis of the mid to distal septal wall.  There was mild mitral regurgitation.  She underwent cardiac catheterization today, which showed about 50% distal left main stenosis.  The left main and proximal coronary arteries were all heavily calcified.  The LAD had diffuse proximal disease of 70-90%.  There was a first diagonal with a 95% stenosis.  The left circumflex had bifurcating posterolateral branch and had 60-70% lesion.  There was a 70% obtuse marginal stenosis.  The right coronary artery had a 60-70%  ostial stenosis and a long 90% mid vessel stenosis.  Ejection fraction was noted to be 45-50%.  REVIEW OF SYSTEMS: CONSTITUTIONAL: She denies fever or chills.  She has had no recent weight loss or weight gain.  EYES: Negative.  ENT: Negative. ENDOCRINE: She had non-insulin dependent diabetes.  She denies hypothyroidism. CARDIOVASCULAR: She denies any chest pain or pressure.  She has had no shortness of breath at home.  She has noticed no change in her energy level. She denies palpitations.  She has had no PND or orthopnea.  RESPIRATORY: She denies cough or sputum production.  GI: She has had no nausea or vomiting. She denies melena or bright red blood per rectum.  GU: She denies dysuria and hematuria.  NEUROLOGIC: She has had no focal weakness or numbness.  She denies dizziness or syncope.  She has had no amaurosis fugax.  HEMATOLOGIC: She denies any history of bleeding disorders or easy bleeding.  PERIPHERAL VASCULAR: She does have a nonhealing right second degree toe ulcer, with rest pain in her right foot.  She denies calf claudication.  PSYCHIATRIC: Negative.  ALLERGIES: None.  PAST MEDICAL HISTORY:  1. Non-insulin dependent diabetes.  2. Hypertension.  3. History of peripheral vascular disease.  4. Status post left eye cataract surgery in the past.  ADMISSION MEDICATIONS:  1. Atenolol 50 mg q.d.  2. Labetalol 200 mg q.d.  3. Actos 15 mg q.d.  FAMILY HISTORY: Significant for cerebrovascular disease, with mother having  died at age 46 of a stroke.  Her father died of lung cancer.  Two brothers died of unknown causes.  SOCIAL HISTORY: She is a widow with three children.  She denies any history of tobacco or alcohol use.  PHYSICAL EXAMINATION:  VITAL SIGNS: Blood pressure 168/68, heart rate 85 and regular, respiratory rate 16 and unlabored.  GENERAL: She is a well-developed elderly black woman, in no distress.  HEENT: Normocephalic, atraumatic.  PERRLA.  EOMI.  Throat  clear.  NECK: Normal carotid pulses bilaterally.  No bruits.  No adenopathy or thyromegaly.  CARDIAC: Regular rate and rhythm.  No murmurs, rubs, or gallops.  LUNGS: Clear.  ABDOMEN: Active bowel sounds.  Abdomen soft and flat, and nontender.  No placed masses or organomegaly.  EXTREMITIES: No peripheral edema.  Pedal pulses not palpable.  She has a nonhealing right second toe with some dry gangrene present.  There is no sign of active infection.  SKIN: Warm and dry.  NEUROLOGIC: Alert and oriented x 3.  Motor and sensory examination grossly normal.  LABORATORY DATA: Chest x-ray dated August 11, 2001 shows stable mild diffuse interstitial edema and small bilateral pleural effusions.  Laboratory examination from August 11, 2001 shows a BUN of 33 and creatinine of 1.3.  Albumin is reduced at 2.8.  Liver function profile is within normal limits.  Hemoglobin is 9.0, with platelet count of 194,000.  WBC is 7.8.  Electrocardiogram shows normal sinus rhythm with left ventricular hypertrophy and anterolateral ST-T wave changes.  IMPRESSION: This patient has left main and severe three-vessel coronary disease, status post acute non-Q wave myocardial infarction during peripheral angiography.  RECOMMENDATIONS: I agree that coronary artery bypass graft surgery is probably the best long-term treatment for her coronary disease, especially since angioplasty is not felt to be a good option for her.  She will need her coronary disease treated before proceeding with peripheral angiography and revascularization of her right lower extremity to prevent further ischemia and gangrene.  I discussed the operative procedure of coronary artery bypass surgery with her and her three daughters, including alternatives, benefits and risks, including bleeding, infection, possible blood transfusion, stroke, myocardial infarction, renal failure, other organ failure, and death.  They understand and  would like to think about it further.  I will return to answer  any questions that they have and plan to proceed with surgery if she decides that is what she would like to do. Dictated by:   Alleen Borne, M.D. Attending Physician:  Colvin Caroli DD:  08/13/01 TD:  08/13/01 Job: [id number]

## 2011-04-01 NOTE — Op Note (Signed)
NAMEBRITTNAE, ASCHENBRENNER                            ACCOUNT NO.:  [id number]   MEDICAL RECORD NO.:  [id number]                   PATIENT TYPE:  OIB   LOCATION:  2899                                 FACILITY:  [facility]   PHYSICIAN:  Quita Skye. Hart Rochester, M.D.               DATE OF BIRTH:  [birth year]   DATE OF PROCEDURE:  01/29/2004  DATE OF DISCHARGE:  01/29/2004                                 OPERATIVE REPORT   PREOPERATIVE DIAGNOSIS:  Nonhealing ulcer, left foot, secondary to femoral,  popliteal, and tibial occlusive disease.   PROCEDURE:  Abdominal aortogram with bilateral lower extremity runoff via  left common femoral approach.   SURGEON:  Quita Skye. Hart Rochester, M.D.   FIRST ASSISTANT:  Nurse.   ANESTHESIA:  Local Xylocaine.   CONTRAST:  120 mL.   COMPLICATIONS:  None.   DESCRIPTION OF PROCEDURE:  The patient was taken to the [facility]  Peripheral Endovascular Lab, placed in the supine position, at which time  both groins were prepped with Betadine scrub and solution and draped in the  routine sterile manner.  After infiltration with 1% Xylocaine, the left  common femoral artery was entered percutaneously, a guidewire passed into  the suprarenal aorta under fluoroscopic guidance.  A 5 French sheath and  dilator were passed over the guidewire, the dilator removed, and a standard  pigtail catheter positioned in the suprarenal aorta.  Flush abdominal  aortogram was performed injecting 20 mL of contrast at 20 mL/sec.  The aorta  was widely patent with single renal arteries bilaterally.  There was a  previous stent in the origin of the right renal artery, which was widely  patent with no evidence of significant stenosis.  The left renal artery had  an approximate 30-40% stenosis about a centimeter from the origin.  The  aorta was widely patent distally, as were both common, internal, and  external iliac arteries.  The catheter was withdrawn into the terminal aorta  and bilateral lower  extremity runoff performed, injecting 80mL of contrast  at 66mL/sec.  The right leg had a widely patent common and profunda femoris  artery.  There was a vein graft originating at the proximal superficial  femoral artery in the right leg, extending the entire length of the leg,  anastomosed to the posterior tibial artery at the ankle, which was widely  patent.  On the left side there was diffuse superficial femoral occlusive  disease with a small vessel which became totally occluded in the distal  thigh.  The popliteal artery was occluded throughout, as were all of the  proximal tibial vessels, with reconstitution of the peroneal artery at the  junction of the middle and lower third of the leg.  The posterior tibial  artery appeared to reconstitute near the ankle.  Better views of the runoff  were then obtained through the sheath using the peek-hold technique.  Following this  the sheath was removed, adequate compression applied, and no  complications ensued.   FINDINGS:  1. Widely patent right renal artery stent site with no residual stenosis.  2. Mild proximal left renal artery stenosis, approximately 40%.  3. Widely patent right superficial femoral to posterior tibial bypass.  4. Diffuse left superficial femoral occlusive disease with total occlusion     distally and occlusion of the popliteal artery with reconstitution of one-     vessel distal peroneal artery on the left.                                               Quita Skye Hart Rochester, M.D.    JDL/MEDQ  D:  01/29/2004  T:  01/31/2004  Job:  [id number]

## 2011-04-01 NOTE — Letter (Signed)
June 27, 2006     Adrienne Bowman, Nurse Practitioner  [facility]  [address]  [place], [place] [address]   RE:  Adrienne Bowman, Adrienne Bowman  MRN:  [id number]  /  DOB:  1918-11-07   Dear Lawson Fiscal:   Adrienne Bowman was seen in the office again today to discuss with her, her  anemia.  She had labs here which shows that she has iron deficiency anemia.  [name] absolutely refuses to take any iron as it causes her to be  constipated and no amount of discussion would get her to change her stance.  I offered to do injectable iron and this again was not acceptable.  She has  indicated that she will try to eat some more iron rich foods and these were  reviewed with her.  She was accompanied by her grandson, Barry Culverhouse, who  is her POA.   If I can be of further assistance please do not hesitate to call.    Sincerely,      Billie D. Jillyn Hidden, FNP  Arta Silence, MD   BDB/MedQ  DD:  06/27/2006  DT:  06/28/2006  Job #:  [id number]

## 2011-04-01 NOTE — H&P (Signed)
[place]  Patient:    Adrienne Bowman, Adrienne Bowman Visit Number: [id number] MRN: [id number]          Service Type: Attending:  Quita Skye. Hart Rochester, M.D. Dictated by:   Durenda Age, P.A.-C. Adm. Date:  08/10/01   CC:         Adrienne Bowman, D.P.M.                         History and Physical  DATE OF BIRTH:  Nov 03, 2018  CHIEF COMPLAINT:  Peripheral vascular occlusive disease.  HISTORY OF PRESENT ILLNESS:  Adrienne Bowman is a pleasant, 77 year old, African-American female referred by Adrienne Bowman, D.P.M., for evaluation of PVOD.  The patient was evaluated first in February of 2002 when she presented with a traumatized area, having hit the toenail with subsequent development of an ulceration in the right second toe that has been slow to heal.  She now presents with complaints of increasing pain below the ankle "all the time," having to "hang the foot" to find some relief.  The last Doppler in June of 2002 showed a right ABI of 46% with a left ABI of 70%. Quita Skye Hart Rochester, M.D., evaluated the patient and recommended to proceed with angiography and revascularization to attempt to heal the foot.  This will take place on August 09, 2001, and August 10, 2001, respectively.  The pain is present at rest and at night.  She has ischemic changes and peripheral edema.  She denies any shortness of breath or dyspnea on exertion.  No chest pain or palpitations.  PAST MEDICAL HISTORY:  PVOD, NIDDM, and hypertension.  PAST SURGICAL HISTORY:  Status post left eye cataract surgery.  MEDICATIONS: 1. Atenolol 50 mg q.d. 2. Labetalol 200 mg q.d. 3. Actos 15 mg q.d.  ALLERGIES:  NKDA.  REVIEW OF SYSTEMS:  See the HPI and past medical history for significant positives.  No kidney disease.  No asthma, MI, CHF, CVA, TIA, amaurosis fugax, COPD, DVT, or PE.  FAMILY HISTORY:  Mother died of CVA at 109.  Father died of lung cancer.  Two brothers died of unknown  causes.  SOCIAL HISTORY:  Widow.  Three children.  She is retired.  She is a poor historian.  She denies any tobacco or alcohol intake.  PHYSICAL EXAMINATION:  An 77 year old black female in no acute distress. Alert and oriented x 3.  HEENT:  Normocephalic and atraumatic.  PERRLA.  EOMI.  She has right cataracts.  NECK:  Supple.  No JVD or bruits.  No lymphadenopathy.  CHEST:  Symmetrical on inspiration.  LUNGS:  Mild atelectasis bilaterally.  No wheezes, rales, or rhonchi.  CARDIOVASCULAR:  Regular rate and rhythm with a 2/6 systolic murmur and loud S2.  No rubs or gallops.  ABDOMEN:  Soft and nontender.  Bowel sounds x 4.  No masses or bruits.  GENITAL:  Deferred.  RECTAL:  Deferred.  EXTREMITIES:  No clubbing or cyanosis.  There is 2+ edema on the right.  She has an ulceration in the right second toe with no visible infection.  PERIPHERAL PULSES:  Carotid and femoral 2+ bilaterally.  Popliteal 1+ on the left and absent on the right.  Dorsalis pedis and posterior tibialis absent bilaterally.  NEUROLOGIC:  Nonfocal.  Gait steady.  DTRs 2+ bilaterally.  Muscle strength 5/5.  ASSESSMENT AND PLAN:  Arteriogram in the PV lab on August 09, 2001, secondary to peripheral vascular occlusive disease and claudication  followed by right femoropopliteal bypass graft by Quita Skye. Hart Rochester, M.D., on August 10, 2001.  Dr. Hart Rochester has seen and evaluated this patient prior to the admission and has explained the risks and benefits involving the procedure.  The patient is ready to continue. Dictated by:   Durenda Age, P.A.-C. Attending:  Quita Skye. Hart Rochester, M.D. DD:  08/08/01 TD:  08/08/01 Job: [id number]

## 2011-04-01 NOTE — Discharge Summary (Signed)
[place]. [facility]  Patient:    Adrienne Bowman, Adrienne Bowman Visit Number: [id number] MRN: [id number]          Service Type: SUR Location: 2000 2027 01 Attending Physician:  Colvin Caroli Dictated by:   Loura Pardon, P.A. Admit Date:  08/30/2001 Discharge Date: 09/04/2001   CC:         Alleen Borne, M.D.  Colleen Can. Deborah Chalk, M.D.  Eric Form, M.D.   Discharge Summary  DATE OF BIRTH:  [birth year]  CV SURGEON:  Alleen Borne, M.D.  CARDIOLOGIST:  Colleen Can. Deborah Chalk, M.D.  PRIMARY CARE PHYSICIAN:  Billy "Eric Form, M.D.  DISCHARGE DIAGNOSES: 1. Infra-inguinal arterial occlusive disease. 2. Right superficial femoral artery occlusion. 3. Occlusion of all tibial vessels on right with reconstitution of the    posterior tibial at the right ankle. 4. Enterobacter cloacae infection of left thigh, saphenous venectomy harvest    site. 5. Postoperative acute blood loss anemia.  SECONDARY DIAGNOSES: 1. Atherosclerotic coronary artery disease, status post coronary artery bypass    graft surgery on August 16, 2001, Dr. Laneta Simmers. 2. Recent myocardial infarction prior to aborted peripheral angiogram on    August 09, 2001, leading to left heart catheterization on August 09, 2001. 3. Hypertension. 4. History of congestive heart failure, pulmonary edema at the time of aborted    peripheral angiogram on August 09, 2001. 5. Anemia. 6. Gastroesophageal reflux disease.  PROCEDURE: 1. On August 30, 2001, right femoral angiogram with right lower extremity run    off.  This study showed, as mentioned above, right superficial femoral    artery occlusion with occlusion of all tibial vessels with reconstitution    of the posterior tibial at the right ankle. 2. Right femoral to posterior tibial bypass using reverse greater saphenous    vein from the right lower extremity as a conduit.  Also right first and    second toe amputation, Dr. Hart Rochester  surgeon.  The patient tolerated the    procedure well, and is ready for discharge on postoperative day #5.  DISCHARGE DISPOSITION:  Adrienne Bowman is ready for discharge on September 04, 2001.  She has done well in the postoperative period.  After undergoing right femoral to posterior tibial bypass she did exhibit some anemia postoperatively with a nadir hemoglobin of 8.3.  This was found to be stable on postoperative day #2 and 3.  She will have a recheck on postoperative day #5 prior to going home.  She had been placed on iron supplementation.  Her mental status has remained clear, and she has been afebrile.  She did not require any supplemental oxygen, and she did not experience any cardiac dysrhythmias.  She was given a Darco boot to help her ambulation for the right foot.  Ankle brachial indexes were obtained on postoperative day #1, on the right they were 0.43 prior to surgery, and then 0.55 after surgery.  On the left, 0.54 before surgery, and 0.59 after surgery.  Her postoperative creatinine was 1.0.  On postoperative day #2, it was noted that she had overtly purulent drainage from the left thigh saphenous venectomy harvest site which had broken open about 1/2 inch.  Culture grew Enterobacter cloacae.  She was started on Zosyn postoperative day #2, and has remained on Zosyn for three days.  She was also started on t.i.d. wound packing with one 1/4 inch gauze strips.  The organism is a gram negative rod, sensitive to  both ticarcillin and Cipro, and it is suggested that she goes home on Cipro 500 mg b.i.d.  The wounds on the right lower extremity are healing nicely.  There was some minor drainage from the site that has been sutured at the posterior tibial area.  This is sutured with vertical mattress sutures, and these sutures will not be removed for one month.  Estimated date would be September 27, 2001.  In addition, she goes home on the following medications:  DISCHARGE  MEDICATIONS:  1. Percocet 5/325 one or two tabs p.o. q.4-6h. p.r.n. pain.  2. Enteric-coated aspirin 325 mg q.d.  3. Pepcid 20 mg p.o. b.i.d.  4. Actos 15 mg q.d.  5. Atenolol 50 mg q.d.  6. Klonodine 0.2 mg per 24 hour transdermal patch apply weekly.  7. Norvasc 10 mg q.d.  8. Lasix 40 mg q.d.  9. Potassium chloride 20 mEq q.d. 10. Cipro 500 mg b.i.d. for a 9 day course.  She was also seen in the postoperative period by diabetes management personnel, and was given a one touch ultrameter for home use.  She does not currently have a Glucometer at home.  ACTIVITY:  Ambulate with the help of physical therapy, which is going to be provided by home health.  She is to use protective Delco boot on the right foot.  DIET:  Low sodium, low cholesterol, ADA diet.  WOUND CARE:  She may shower daily.  She is to apply a dry gauze to the right groin every morning, and a dry gauze to the right foot daily at the toe amputation site, and to wrap this with Kerlix.  She will be helped in this by home health registered nursing.  She is to pack the left thigh with ribbon gauze b.i.d. and to cover the wound with 4 x 4 gauze.  She has a rolling walker to help in ambulation, and a 3-in-1 chair.  She has an aid to help with assistance in the activities of daily living.  FOLLOWUP:  Dr. Laneta Simmers and Dr. Hart Rochester in the office on Tuesday, September 11, 2001, at 9:15 a.m.  Her wounds will be checked at that time.  VERTICAL MATTRESS SUTURES STAY IN THE RIGHT LEG AT THE ANKLE UNTIL September 27, 2001, APPROXIMATELY ONE MONTH.  HISTORY OF PRESENT ILLNESS:  Adrienne Bowman is an 77 year old female.  She is a patient of Dr. Hart Rochester with known history of infrainguinal arterial occlusive disease.  She was scheduled to have an angiogram as part of a workup  for a right lower extremity bypass.  This was to be done on August 09, 2001, prior to the procedure.  She developed increasing shortness of breath and acute  onset of pulmonary edema.  The angiogram was aborted, and a cardiology evaluation was obtained.  Her cardiac enzymes were increased, indicating a non-Q-wave myocardial infarction, and she was found to have severe three vessel atherosclerotic coronary artery disease on left heart catheterization done the day of the proposed angiogram.  She underwent coronary artery bypass graft surgery x 6 on August 16, 2001, by Dr. Evelene Croon.  She is now stable postoperatively from her heart surgery, and is now rescheduled for a peripheral vascular evaluation.  Her ankle brachial index on the right is 0.43.  She will be admitted to [facility] on August 30, 2001, for right lower extremity angiogram and then surgery following the results of this study.  HOSPITAL COURSE:  As described in the discharge disposition. Dictated by:  Loura Pardon, P.A. Attending Physician:  Colvin Caroli DD:  09/03/01 TD:  09/04/01 Job: [id number]

## 2011-04-01 NOTE — Procedures (Signed)
[place]. [facility]  Patient:    ADRINNE, Adrienne Bowman Visit Number: [id number] MRN: [id number]          Service Type: DSU Location: MICU 2115 01 Attending Physician:  Colvin Caroli Dictated by:   Quita Skye Hart Rochester, M.D. Proc. Date: 08/09/01 Admit Date:  08/09/2001   CC:         Colleen Can. Deborah Chalk, M.D.   Procedure Report  PROCEDURE:  Left femoral artery cannulation with aborted angiogram.  PREOPERATIVE DIAGNOSIS:  Ischemic right foot with non-healing ulcer right second toe secondary to femoral, popliteal and tibial occlusive disease.  SURGEON:  Quita Skye. Hart Rochester, M.D.  ANESTHESIA:  Local and Versed 1 mg intravenously.  DESCRIPTION OF PROCEDURE:  The patient was taken to the [facility] Peripheral Endovascular Lab and placed in the supine position.  At that time, both groins were prepped with Betadine solution and draped in the routine sterile manner. After infiltration with 1% Xylocaine, the left common femoral artery was entered percutaneously, guide wire passed into the suprarenal aorta under fluoroscopic guidance.  A 5 French sheath and dilator were then inserted. Dilator was removed and a standard pigtail catheter was positioned in the suprarenal aorta.  Approximately 5 cc of contrast were given to verify the position in relation to the renal arteries.  During this time interval, the patient became progressively dyspneic with oxygen saturations dropping into the low 80s and her blood pressure increased with a systolic of 230 and diastolic of 110 and tachycardia with a heart rate of 110.  She became disoriented and dyspneic and was given 10 mg of Labetalol intravenously x 2 plus 40 mg of Lasix intravenously and a breathing treatment with Albuterol.  A Foley catheter was inserted.  She continues to have low oxygen saturations in the mid to high 80% range and her angiogram was aborted with no further contrast being administered.  The pigtail  catheter was removed over the guide wire and the blood pressure was monitored through the sheath in the left femoral artery.  Dr. Delfin Edis was consulted for cardiology evaluation and he administered IV nitroglycerin and further Labetalol and Lasix to the patient.  The patient was then more comfortable and was transferred to the medical intensive care unit for further cardiology evaluation.  The left sheath was removed, adequate compression applied.  No complications ensued. Dictated by:   Quita Skye Hart Rochester, M.D. Attending Physician:  Colvin Caroli DD:  08/09/01 TD:  08/09/01 Job: [id number]

## 2011-04-01 NOTE — Op Note (Signed)
Adrienne Bowman, Adrienne Bowman                            ACCOUNT NO.:  [id number]   MEDICAL RECORD NO.:  [id number]                   PATIENT TYPE:  INP   LOCATION:  4731                                 FACILITY:  [facility]   PHYSICIAN:  Quita Skye. Hart Rochester, M.D.               DATE OF BIRTH:  11-13-1918   DATE OF PROCEDURE:  06/05/2003  DATE OF DISCHARGE:                                 OPERATIVE REPORT   PREOPERATIVE DIAGNOSIS:  Severe hypertension, uncontrolled, with bilateral  renal artery occlusive disease.   PROCEDURE:  1. Abdominal aortogram via left common femoral approach.  2. Bilateral selective renal angiograms (first order catheterization).  3. PTA/stenting of severe right renal artery stenosis using a 6 x 1.5 mm     Hercu-Link Plus system at:  a. 14 atmospheres for 65 seconds; b. 14     atmospheres for 30 seconds; and using a 6 x 15 mm Viatrac angioplasty     catheter at 16 atmospheres for 30 seconds.   SURGEON:  Quita Skye. Hart Rochester, M.D.   FIRST ASSISTANT:  Balinda Quails, M.D.   ANESTHESIA:  Local Xylocaine; labetalol 10 mg IV given x2; and heparin 4000  units.   COMPLICATIONS:  None.   DESCRIPTION OF PROCEDURE:  The patient was taken to [facility] peripheral  endovascular laboratory, placed in the supine position, at which time both  groins were prepped with Betadine solution and draped in a routine sterile  manner.  After infiltration with 1% Xylocaine the left common femoral artery  was entered percutaneously, guidewire passed into the suprarenal aorta under  fluoroscopic guidance.  A 5 French sheath and dilator were passed over the  guidewire, the dilator removed, and a standard pigtail catheter positioned  in the suprarenal aorta.  A perirenal abdominal aortogram was performed  injecting 20 mL of contrast at 20 cc/second.  This revealed the celiac  access and the superior mesenteric artery to fill rapidly.  There was a  severe stenosis at the ostium of the right renal artery  and a moderate left  renal artery stenosis.  A lateral aortogram was then performed which  revealed occlusive disease in the superior mesenteric artery beginning about  2 cm distal to the origin which approximated 50% over about 1 cm in length.  The celiac access was widely patent.  The pigtail catheter was exchanged for  an IMA catheter and bilateral selective renal angiograms were performed  using hand injections of 6 mL of contrast.  This revealed an approximate 50%  proximal left renal artery stenosis beginning about 1-2 cm distal to the  origin and on the right side there was an ostial stenosis approximating 90%.  Using a 0.014 guidewire the right renal artery was cannulated.  The IMA  delivery system was then passed over the 0.014 guidewire in the right renal  artery and right renal artery stenosis was then dilated  and primarily  stented using the 6 x 1.5 mm Hercu-Link Plus system (Guidant).  The initial  angioplasty was done at 14 atmospheres for 65 seconds.  There was a wasting  in the mid portion of the stent which was again dilated at 14 atmospheres  for 30 seconds.  Postangioplasty angiogram revealed continued wasting with  an approximate 50% stenosis.  The angioplasty catheter was exchanged for a 6  x 15 mm Viatrac catheter which was inflated at 16 atmospheres for 30 seconds  and this relieved the wasting in the mid portion of the stent.  A  postangioplasty angiogram then revealed a widely patent right renal artery  with no residual stenosis.  The delivery system and guidewire were removed  and following reversal of the heparin the sheath was removed, adequate  compression applied, no complications ensued.    FINDINGS:  1. A 90% ostial right renal artery stenosis.  2. A 50% proximal left renal artery stenosis.  3. 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   PROCEDURE PERFORMED:  Successful PTA and primary stenting of severe right  renal artery stenosis using a  6 x 1.5 Hercu-Link Plus system with second  angioplasty performed using a 6 x 15 Viatrac angioplasty catheter.                                               Quita Skye Hart Rochester, M.D.    JDL/MEDQ  D:  06/05/2003  T:  06/05/2003  Job:  [id number]   cc:   Colleen Can. Deborah Chalk, M.D.  [address], [address]  [place]  [place] [address]  Fax: [phone]

## 2011-04-01 NOTE — Op Note (Signed)
[place]. [facility]  Patient:    Adrienne Bowman, Adrienne Bowman Visit Number: [id number] MRN: [id number]          Service Type: Attending:  Quita Skye. Hart Rochester, M.D. Dictated by:   Quita Skye Hart Rochester, M.D. Proc. Date: 08/30/01                             Operative Report  PREOPERATIVE DIAGNOSIS: Gangrene of right first and second toe secondary to severe superficial femoropopliteal and tibial occlusive disease.  POSTOPERATIVE DIAGNOSIS: Gangrene of right first and second toe secondary to severe superficial femoropopliteal and tibial occlusive disease.  OPERATION/PROCEDURE:  1. Right proximal superficial femoral to posterior tibial bypass using a     nonreverse translocated saphenous vein graft from right leg, with     intraoperative arteriogram.  2. Transmetatarsal amputation of right first and second toes.  SURGEON: Quita Skye. Hart Rochester, M.D.  FIRST ASSISTANT: Di Kindle. Edilia Bo, M.D.  SECOND ASSISTANT: Noel Journey.  ANESTHESIA: General endotracheal.  DESCRIPTION OF PROCEDURE: The patient was taken to the operating room and placed in the supine position, at which time satisfactory general endotracheal anesthesia was administered.  The right leg was prepped with Betadine scrubbing solution and draped in routine sterile manner, isolating the foot in a sterile isolation bag.  The saphenous vein was exposed from the saphenofemoral junction to the ankle and its branches ligated with 4-0 and 5-0 silk ties and divided.  It was removed and gently dilated with heparinized saline and marked for orientation purposes.  It was an excellent vein throughout, being at least 3.5-4 mm in size.  The superficial femoral artery was exposed up to its origin.  There was diffuse calcification throughout the common femoral artery, but the proximal superficial femoral artery had an excellent pulse and was soft anteriorly, and was used for inflow.  The posterior tibial artery was exposed just  posterior to the medial malleolus through a longitudinal incision.  It was a diseased vessel proximally but was patent distally as noted on the angiogram.  The patient was heparinized.  The superficial femoral artery was opened with a 15 blade, extended with Potts scissors after occluding it proximally and distally with vascular clamps.  The proximal end of the vein was spatulated and anastomosed end-to-side using continuous 6-0 Prolene.  The clamp was then released and there was good pulse down to the first set of competent valves.  Using a retrograde valvulotome the valves were rendered incompetent with resultant excellent flow out of the distal end of the vein graft.  The vein was carefully delivered through the subcutaneous tunnel down to the posterior tibial exposure incision and it was then anastomosed end-to-side to the posterior tibial artery.  The posterior tibial artery was opened with a 15 blade, extended with the Potts scissors. It would accept a 2 mm dilator distally and had fair backbleeding. Anastomosis was done with continuous 6-0 Prolene for the heel, interrupted 7-0 Prolene for the toe.  Following appropriate flushing this was completed.  The vessel loops were released and there was an excellent pulse in the graft. Intraoperative arteriogram revealed rapid filling of the posterior tibial artery and venous return.  Protamine was given to reverse the heparin. Following adequate hemostasis the wound was irrigated with saline and closed in layers with Vicryl and clips, with 3-0 nylon used for the posterior ankle incision.  Sterile dressings were applied and attention was turned to the foot.  There was  gangrene of the first and second toes.  An elliptical incision was made on the proximal to the flexion creases extending down the head of the first metatarsal and carried down to bone.  The first metatarsal was disarticulated at the metatarsophalangeal joint and then the  metatarsal was transected about 3 cm proximal to this and shortened with a rongeur. Similarly, the second toe was disarticulated at the metatarsophalangeal joint and the metatarsal bone was shortened a few cm.  There was excellent bleeding from the skin edges and there was no evidence of deep infection.  Following completion of this the wound was closed in layers with Vicryl and interrupted nylon.  Sterile dressing was applied and the patient taken to the recovery room in satisfactory condition. Dictated by:   Quita Skye Hart Rochester, M.D. Attending:  Quita Skye. Hart Rochester, M.D. DD:  08/30/01 TD:  08/31/01 Job: [id number]

## 2011-04-01 NOTE — H&P (Signed)
NAME:  Adrienne Bowman, Adrienne Bowman                            ACCOUNT NO.:  [id number]   MEDICAL RECORD NO.:  [id number]                   PATIENT TYPE:  INP   LOCATION:  NA                                   FACILITY:  [facility]   PHYSICIAN:  Quita Skye. Hart Rochester, M.D.               DATE OF BIRTH:  21-Oct-1918   DATE OF ADMISSION:  06/05/2003  DATE OF DISCHARGE:                                HISTORY & PHYSICAL   REFERRING PHYSICIAN:  1. Colleen Can. Deborah Chalk, M.D.  2. Dr. Marlise Eves, of Family Practice at [facility].   CHIEF COMPLAINT:  Renal artery stenosis with progressive hypertension.   HISTORY OF PRESENT ILLNESS:  The patient is an 77 year old black female who  appears much younger than her stated age, status post right femoral to  posterior tibial bypass graft with amputation of right first and second  toes.  She is also status post CABG August 16, 2001.  She now presents with  worsening of her hypertension.  Followup with Dr. Hart Rochester which lead him to  believe the patient probably has renal artery occlusive disease which is  causing her hypertension.  She is admitted now for arteriography and then  renal artery PTA and stent if there is a lesion that is amenable to this  therapy.   PAST MEDICAL HISTORY:  1. Hypertension x30 years.  2. Coronary artery disease with congestive heart failure and myocardial     infarction.  3. History of anemia.  4. History of gastroesophageal reflux disease.  5. History of peripheral vascular occlusive disease.  6. Diabetes x15 years.  Sugars at home run in the 130s.   PAST SURGICAL HISTORY:  1. She had a left femoral arteriogram August 30, 2002.  2. Right femoral to posterior tibial bypass graft with reversed saphenous     vein.  3. Amputation of first and second right toes August 30, 2001.  4. Status post CABG x6 August 16, 2001.  5. Status post laser left eye in 1987 and 1988.   CURRENT MEDICATIONS:  1. Norvasc 10 mg daily.  2.  Atenolol 50 mg b.i.d.  3. Actos 15 mg daily.  4. Hydrochlorothiazide strength unknown, takes b.i.d.  5. Cozaar 10 mg daily.  6. Clonidine 0.2 mg b.i.d. and 1.0 mg daily.   REVIEW OF SYSTEMS:  Negative on thyroid, swallowing, weight.  Positive  history of diabetes.  No history of kidney disease.  No history of asthma,  COPD.  No history of TIA, no history of CVA, no history of syncope,  amaurosis.  She does have positive history for coronary artery disease,  myocardial infarction, gastroesophageal reflux disease, congestive heart  failure, and hypertension.  No angina.  No arrhythmias.  No pulmonary  embolus.  No DVT.   FAMILY HISTORY:  The patient has a hard time remembering her parents' ages  and diseases.  Father died  of locked bowels.  Mother had some kind of  cardiac condition.  Sisters:  She has one with diabetes, one with congestive  heart failure.  Brothers:  She has one with alcohol abuse, one with coronary  artery disease and one with diabetes.   SOCIAL HISTORY:  The patient is widowed.  She has six children, one deceased  with MI.  She does not use alcohol or tobacco.   PHYSICAL EXAMINATION:  GENERAL:  This is a well-developed, well-nourished  black female in no acute distress.  VITAL SIGNS:  Blood pressure 212/80 in the right arm and 200/80 in the left  arm sitting, pulse 76, respiratory rate 20 unlabored.  HEENT:  Normocephalic.  Eyes:  PERRLA, EOM intact.  Fundi not visualized.  Ears, nose, throat and mouth grossly within normal limits.  NECK:  Supple, no JVD.  CHEST:  Clear to auscultation and percussion bilaterally.  Respirations  unlabored.  Well-healed mediastinotomy incision.  CARDIOVASCULAR:  Regular rate and rhythm with no murmurs, rubs, or gallops.  Positive S3.  ABDOMEN:  Soft, nontender with positive bowel sounds, no palpable  organomegaly.  GENITALIA/RECTAL:  Deferred.  EXTREMITIES:  No clubbing, cyanosis, or edema.  NEUROLOGICAL:  Grossly within normal  limits.     Eber Hong, P,A..-Ilean Skill. Hart Rochester, M.D.    WDJ/MEDQ  D:  06/03/2003  T:  06/03/2003  Job:  [id number]   cc:   Colleen Can. Deborah Chalk, M.D.  [address], [address]  [place]  [place] [address]  Fax: [phone]    cc:   Colleen Can. Deborah Chalk, M.D.  [address]  [place]  [place] [address]  Fax: [phone]

## 2011-05-12 ENCOUNTER — Telehealth: Payer: Self-pay | Admitting: Cardiology

## 2011-05-12 NOTE — Telephone Encounter (Signed)
Patient says that she is supposed to come back for f/u but does not know who she is to be reassigned to. Please call her back to discuss this with her.

## 2011-05-12 NOTE — Telephone Encounter (Signed)
Pt calling requesting a follow up appointment.  RN set pt up to see Lawson Fiscal on 05/30/11 at 0945.  Pt notified of appointment.

## 2011-05-30 ENCOUNTER — Telehealth: Payer: Self-pay | Admitting: *Deleted

## 2011-05-30 ENCOUNTER — Ambulatory Visit (INDEPENDENT_AMBULATORY_CARE_PROVIDER_SITE_OTHER): Payer: Medicare Other | Admitting: Nurse Practitioner

## 2011-05-30 ENCOUNTER — Encounter: Payer: Self-pay | Admitting: Nurse Practitioner

## 2011-05-30 VITALS — BP 130/60 | HR 52 | Wt 111.8 lb

## 2011-05-30 DIAGNOSIS — E785 Hyperlipidemia, unspecified: Secondary | ICD-10-CM

## 2011-05-30 DIAGNOSIS — I739 Peripheral vascular disease, unspecified: Secondary | ICD-10-CM

## 2011-05-30 DIAGNOSIS — I2510 Atherosclerotic heart disease of native coronary artery without angina pectoris: Secondary | ICD-10-CM | POA: Insufficient documentation

## 2011-05-30 DIAGNOSIS — I10 Essential (primary) hypertension: Secondary | ICD-10-CM

## 2011-05-30 LAB — CBC WITH DIFFERENTIAL/PLATELET
Basophils Absolute: 0 10*3/uL (ref 0.0–0.1)
Basophils Relative: 0.5 % (ref 0.0–3.0)
Eosinophils Absolute: 0.1 10*3/uL (ref 0.0–0.7)
Eosinophils Relative: 1.2 % (ref 0.0–5.0)
HCT: 31.5 % — ABNORMAL LOW (ref 36.0–46.0)
Hemoglobin: 10.7 g/dL — ABNORMAL LOW (ref 12.0–15.0)
Lymphocytes Relative: 20.7 % (ref 12.0–46.0)
Lymphs Abs: 1.3 10*3/uL (ref 0.7–4.0)
MCHC: 34 g/dL (ref 30.0–36.0)
MCV: 88.2 fl (ref 78.0–100.0)
Monocytes Absolute: 0.6 10*3/uL (ref 0.1–1.0)
Monocytes Relative: 9 % (ref 3.0–12.0)
Neutro Abs: 4.3 10*3/uL (ref 1.4–7.7)
Neutrophils Relative %: 68.6 % (ref 43.0–77.0)
Platelets: 234 10*3/uL (ref 150.0–400.0)
RBC: 3.57 Mil/uL — ABNORMAL LOW (ref 3.87–5.11)
RDW: 13.8 % (ref 11.5–14.6)
WBC: 6.3 10*3/uL (ref 4.5–10.5)

## 2011-05-30 LAB — BASIC METABOLIC PANEL
BUN: 43 mg/dL — ABNORMAL HIGH (ref 6–23)
CO2: 27 mEq/L (ref 19–32)
Calcium: 9.1 mg/dL (ref 8.4–10.5)
Chloride: 95 mEq/L — ABNORMAL LOW (ref 96–112)
Creatinine, Ser: 2.8 mg/dL — ABNORMAL HIGH (ref 0.4–1.2)
GFR: 20.3 mL/min — ABNORMAL LOW (ref >60.00)
Glucose, Bld: 218 mg/dL — ABNORMAL HIGH (ref 70–99)
Potassium: 6.7 mEq/L (ref 3.5–5.1)
Sodium: 127 mEq/L — ABNORMAL LOW (ref 135–145)

## 2011-05-30 LAB — LIPID PANEL
Cholesterol: 107 mg/dL (ref 0–200)
HDL: 29.2 mg/dL — ABNORMAL LOW (ref >39.00)
LDL Cholesterol: 42 mg/dL (ref 0–99)
Total CHOL/HDL Ratio: 4
Triglycerides: 178 mg/dL — ABNORMAL HIGH (ref 0.0–149.0)
VLDL: 35.6 mg/dL (ref 0.0–40.0)

## 2011-05-30 LAB — HEPATIC FUNCTION PANEL
ALT: 15 U/L (ref 0–35)
AST: 21 U/L (ref 0–37)
Albumin: 4.1 g/dL (ref 3.5–5.2)
Alkaline Phosphatase: 51 U/L (ref 39–117)
Bilirubin, Direct: 0.1 mg/dL (ref 0.0–0.3)
Total Bilirubin: 0.5 mg/dL (ref 0.3–1.2)
Total Protein: 6.4 g/dL (ref 6.0–8.3)

## 2011-05-30 NOTE — Assessment & Plan Note (Signed)
Clonidine and hydralazine were increased at her last visit. Blood pressure looks great. No changes in her medicines.

## 2011-05-30 NOTE — Assessment & Plan Note (Signed)
She has follow up with Dr. Hart Rochester in September.

## 2011-05-30 NOTE — Telephone Encounter (Signed)
Message copied by Lorayne Bender on Mon May 30, 2011  4:59 PM ------      Message from: Rosalio Macadamia      Created: Mon May 30, 2011  4:48 PM       Please report. Have discussed with Dr. Patty Sermons. Stop both the Altace and Spironolactone.Make sure she is on no potassium supplements. Decrease intake of foods high in potassium.  Recheck BMET on Thursday. Increase fluid intake. Stay out of heat.

## 2011-05-30 NOTE — Assessment & Plan Note (Signed)
She has a history of remote CABG in 2002. She has done well since her surgery. No change in her medicines. Labs are checked today. I will see her back in about 6 months. Patient is agreeable to this plan and will call if any problems develop in the interim.

## 2011-05-30 NOTE — Telephone Encounter (Signed)
Message copied by Lorayne Bender on Mon May 30, 2011  4:54 PM ------      Message from: Rosalio Macadamia      Created: Mon May 30, 2011  4:48 PM       Please report. Have discussed with Dr. Patty Sermons. Stop both the Altace and Spironolactone.Make sure she is on no potassium supplements. Decrease intake of foods high in potassium.  Recheck BMET on Thursday. Increase fluid intake. Stay out of heat.

## 2011-05-30 NOTE — Progress Notes (Signed)
Adrienne Bowman Date of Birth: Apr 20, 1918   History of Present Illness: Adrienne Bowman is seen back today for a 3 month visit. She is seen for Dr. Antoine Poche. She is a former patient of Dr. Ronnald Nian. She is doing well. She is 89. She remains active in church. She is using her walker and prosthesis and gets around fairly well. No chest pain or shortness of breath. Weight is slowly drifting down. She has not been seen by primary care in "a long time". No recent labs. She is tolerating her medicines. No falls. She remains very upbeat.   Current Outpatient Prescriptions on File Prior to Visit  Medication Sig Dispense Refill  . amlodipine-atorvastatin (CADUET) 10-10 MG per tablet Take 1 tablet by mouth daily.        [name] aspirin 81 MG tablet Take 81 mg by mouth daily.        . cloNIDine (CATAPRES) 0.1 MG tablet Take 0.1 mg by mouth at bedtime.        . famotidine (PEPCID) 20 MG tablet Take 20 mg by mouth daily.        . hydrALAZINE (APRESOLINE) 50 MG tablet Take 100 mg by mouth 2 (two) times daily.       . multivitamin (THERAGRAN) per tablet Take 1 tablet by mouth daily.        . ramipril (ALTACE) 10 MG capsule Take 10 mg by mouth daily.        [name] spironolactone (ALDACTONE) 25 MG tablet Take 25 mg by mouth daily.        [name] atenolol (TENORMIN) 50 MG tablet Take 50 mg by mouth daily.        . cloNIDine (CATAPRES) 0.2 MG tablet Take 0.2 mg by mouth 2 (two) times daily.        . pioglitazone (ACTOS) 15 MG tablet Take 15 mg by mouth daily.          Not on File  Past Medical History  Diagnosis Date  . CHF (congestive heart failure) 2002    acute CHF while getting lower extremilty arteriograms /  Led to CABG  x6  . Hypertension   . PVD (peripheral vascular disease)     extensive PVD / left AKA  . Murmur, heart     soft systolic outflow murmur and a grade 2/6 murmur of the aortic insufficiency  . S/P AKA (above knee amputation)     left AKA with prosthesis  . DIABETES MELLITUS, TYPE II   . NEPHROPATHY,  DIABETIC   . Coronary artery disease 2002    CABG x6 / left internal mammary artery graft to the LAD, a saphenous vein graft to the diagonal, a sequential saphenous vein graft to the 1st and 2nd obtuse marginal branches, and a sequential saphenous vein graft the the distal right coronary artery and posterior descending  . Anemia     Past Surgical History  Procedure Date  . Coronary artery bypass graft 2002    x6 per Dr. Laneta Simmers  . Eye surgery 1987 & 1988     left eye  . Vascular surgery     multiple peripheral vascular surgeries  . Left aka 2006  . Right renal artery stent 2004  . Rfpbpg 2002    History  Smoking status  . Never Smoker   Smokeless tobacco  . Never Used    History  Alcohol Use No    Family History  Problem Relation Age of Onset  . Coronary artery  disease      prevalent in sibling  . Heart disease Mother     Review of Systems: The review of systems is positive for hearing loss. No falls. No chest pain. No shortness of breath. She is not dizzy. She is having more in the way on constipation.  All other systems were reviewed and are negative.  Physical Exam: BP 130/60  Pulse 52  Wt 111 lb 12.8 oz (50.712 kg) Patient is very pleasant and in no acute distress. She is thin.  Skin is warm and dry. Color is normal.  HEENT is unremarkable. Normocephalic/atraumatic. PERRL. Sclera are nonicteric. Neck is supple. No masses. No JVD. Lungs are clear. Cardiac exam shows a regular rate and rhythm. She has a 2/6 outflow murmur. Abdomen is soft. Extremities are without edema. Left AKA noted with prosthesis. Gait and ROM are intact. She is using a walker. No gross neurologic deficits noted.  LABORATORY DATA: PENDING   Assessment / Plan:

## 2011-05-30 NOTE — Patient Instructions (Addendum)
Stay on your current medicines For constipation, I suggest Miralax. It is over the counter. You can take it every day or as needed. I will see you in 6 months We are going to check your labs today. Call for any problems.

## 2011-05-30 NOTE — Telephone Encounter (Signed)
Notified of lab results. Advised to stop Ramipril and Spirolactone and avoid K foods. Will recheck Bmet on Thurs.

## 2011-06-02 ENCOUNTER — Other Ambulatory Visit (INDEPENDENT_AMBULATORY_CARE_PROVIDER_SITE_OTHER): Payer: Medicare Other | Admitting: *Deleted

## 2011-06-02 DIAGNOSIS — E785 Hyperlipidemia, unspecified: Secondary | ICD-10-CM

## 2011-06-02 DIAGNOSIS — I2510 Atherosclerotic heart disease of native coronary artery without angina pectoris: Secondary | ICD-10-CM

## 2011-06-02 DIAGNOSIS — I10 Essential (primary) hypertension: Secondary | ICD-10-CM

## 2011-06-02 LAB — BASIC METABOLIC PANEL
BUN: 41 mg/dL — ABNORMAL HIGH (ref 6–23)
CO2: 24 mEq/L (ref 19–32)
Calcium: 9.2 mg/dL (ref 8.4–10.5)
Creatinine, Ser: 2.2 mg/dL — ABNORMAL HIGH (ref 0.4–1.2)
GFR: 27.64 mL/min — ABNORMAL LOW (ref >60.00)
Glucose, Bld: 143 mg/dL — ABNORMAL HIGH (ref 70–99)
Sodium: 130 mEq/L — ABNORMAL LOW (ref 135–145)

## 2011-06-07 NOTE — Progress Notes (Signed)
No answer, unable to leave message °

## 2011-06-08 NOTE — Progress Notes (Signed)
No answer

## 2011-06-14 ENCOUNTER — Telehealth: Payer: Self-pay | Admitting: Nurse Practitioner

## 2011-06-14 NOTE — Telephone Encounter (Signed)
[employer] wants to discuss office visit patient had on 16th to use for mobility issues so pt can get wheelchair please call

## 2011-06-15 ENCOUNTER — Telehealth: Payer: Self-pay | Admitting: Cardiology

## 2011-06-15 NOTE — Telephone Encounter (Signed)
Notified [employer] that forms will be deferred to Dr Ruthe Mannan ph # [phone] and fax # [phone]. The nurse in this office (Vaanya Shambaugh S) also notified Dr. Meryle Ready office that were are deferring this to them.

## 2011-06-15 NOTE — Telephone Encounter (Signed)
Larkin Ina called to confirm receipt of fax. Informed her we have not received their faxl. She provided a new contact number for [employer] since previous number incorrect.

## 2011-06-15 NOTE — Telephone Encounter (Addendum)
Attempt to return call to [employer]. Phone number is incorrect. Spoke to [employer]. They are faxing paper work to be filled out for [employer].

## 2011-06-23 ENCOUNTER — Encounter: Payer: Self-pay | Admitting: *Deleted

## 2011-06-23 ENCOUNTER — Telehealth: Payer: Self-pay | Admitting: *Deleted

## 2011-06-23 NOTE — Telephone Encounter (Signed)
Message copied by Burnell Blanks on Thu Jun 23, 2011  3:00 PM ------      Message from: Cassell Clement      Created: Fri Jun 03, 2011  9:30 AM       Please report.  Potassium is back to normal now.  Continue present regimen.Continue to drink plenty of water

## 2011-06-23 NOTE — Telephone Encounter (Signed)
Tried to reach via phone, mailed letter

## 2011-06-30 ENCOUNTER — Encounter: Payer: Self-pay | Admitting: *Deleted

## 2011-07-25 ENCOUNTER — Encounter: Payer: Self-pay | Admitting: Vascular Surgery

## 2011-07-26 ENCOUNTER — Encounter (INDEPENDENT_AMBULATORY_CARE_PROVIDER_SITE_OTHER): Payer: Medicare Other | Admitting: Vascular Surgery

## 2011-07-26 ENCOUNTER — Ambulatory Visit (INDEPENDENT_AMBULATORY_CARE_PROVIDER_SITE_OTHER): Payer: Medicare Other | Admitting: Vascular Surgery

## 2011-07-26 ENCOUNTER — Encounter: Payer: Self-pay | Admitting: Vascular Surgery

## 2011-07-26 VITALS — BP 198/59 | HR 69 | Resp 14 | Ht 62.0 in | Wt 118.0 lb

## 2011-07-26 DIAGNOSIS — Z48812 Encounter for surgical aftercare following surgery on the circulatory system: Secondary | ICD-10-CM

## 2011-07-26 DIAGNOSIS — I70219 Atherosclerosis of native arteries of extremities with intermittent claudication, unspecified extremity: Secondary | ICD-10-CM

## 2011-07-26 DIAGNOSIS — I739 Peripheral vascular disease, unspecified: Secondary | ICD-10-CM

## 2011-07-26 NOTE — Progress Notes (Signed)
Subjective:     Patient ID: Adrienne Bowman, female   DOB: 07/13/1918, 77 y.o.   MRN: [id number]  HPI this 77 year old female patient returns for further evaluation of her lower extremity occlusive disease. She underwent left above-knee dictation in 2000. 2002 she had a right femoral to distal posterior tibial bypass with saphenous vein graft. He also had her first and second right toes amputated. She done very well since that time with ambulation on her prosthesis and currently with a walker. She denies any nonhealing ulcers in the right foot rest pain or gangrene. Today I ordered a lower extremity arterial Doppler study which I reviewed and interpreted. The femoral posterior tibial bypass graft is widely patent with an ABI of greater than 1.0. There are 2 areas of slightly elevated velocity in the vein graft but nothing bothersome.  Past Medical History  Diagnosis Date  . CHF (congestive heart failure) 2002    acute CHF while getting lower extremilty arteriograms /  Led to CABG  x6  . Hypertension   . PVD (peripheral vascular disease)     extensive PVD / left AKA  . Murmur, heart     soft systolic outflow murmur and a grade 2/6 murmur of the aortic insufficiency  . S/P AKA (above knee amputation)     left AKA with prosthesis  . DIABETES MELLITUS, TYPE II   . NEPHROPATHY, DIABETIC   . Coronary artery disease 2002    CABG x6 / left internal mammary artery graft to the LAD, a saphenous vein graft to the diagonal, a sequential saphenous vein graft to the 1st and 2nd obtuse marginal branches, and a sequential saphenous vein graft the the distal right coronary artery and posterior descending  . Anemia     History  Substance Use Topics  . Smoking status: Never Smoker   . Smokeless tobacco: Never Used  . Alcohol Use: No    Family History  Problem Relation Age of Onset  . Coronary artery disease      prevalent in sibling  . Heart disease Mother     No Known Allergies  Current  outpatient prescriptions:amlodipine-atorvastatin (CADUET) 10-10 MG per tablet, Take 1 tablet by mouth daily.  , Disp: , Rfl: ;  aspirin 81 MG tablet, Take 81 mg by mouth daily.  , Disp: , Rfl: ;  atenolol (TENORMIN) 50 MG tablet, Take 50 mg by mouth daily.  , Disp: , Rfl: ;  cloNIDine (CATAPRES) 0.1 MG tablet, Take 0.1 mg by mouth at bedtime.  , Disp: , Rfl:  cloNIDine (CATAPRES) 0.2 MG tablet, Take 0.2 mg by mouth 2 (two) times daily.  , Disp: , Rfl: ;  cloNIDine (CATAPRES) 0.3 MG tablet, Take 0.3 mg by mouth 2 (two) times daily.  , Disp: , Rfl: ;  famotidine (PEPCID) 20 MG tablet, Take 20 mg by mouth daily.  , Disp: , Rfl: ;  hydrALAZINE (APRESOLINE) 50 MG tablet, Take 100 mg by mouth 2 (two) times daily. , Disp: , Rfl:  multivitamin (THERAGRAN) per tablet, Take 1 tablet by mouth daily.  , Disp: , Rfl: ;  ramipril (ALTACE) 10 MG capsule, Take 10 mg by mouth daily.  , Disp: , Rfl: ;  spironolactone (ALDACTONE) 25 MG tablet, Take 25 mg by mouth daily.  , Disp: , Rfl: ;  pioglitazone (ACTOS) 15 MG tablet, Take 15 mg by mouth daily.  , Disp: , Rfl:   BP 198/59  Pulse 69  Resp 14  Ht 5'  2" (1.575 m)  Wt 118 lb (53.524 kg)  BMI 21.58 kg/m2  SpO2 98%  Body mass index is 21.58 kg/(m^2).         Review of Systems she denies chest pain, dyspnea on exertion, PND, orthopnea, diffuse joint pain, reflux esophagitis. He also denies any specific neurologic symptoms. All other systems are negative and a complete review of systems      Objective:   Physical Exam blood pressure 190/59 heart rate 69 respirations 14 Gen. she is an elderly female who is alert and oriented x3 in no apparent distress Chest clear to auscultation no rhonchi or wheezing Cardiovascular exam regular rhythm no murmurs Carotid pulses 3+ with soft bruits bilaterally Abdomen soft nontender with no masses palpable Right leg reveals 3+ femoral and subcutaneously placed vein graft down to the posterior tibial artery with a  well-perfused right foot. Left leg has an above-knee dictation prosthesis in place with 3+ femoral pulse palpable.    Assessment:     Nicely functioning femoral to distal posterior tibial vein graft placed in 2002.    Plan:     Continue every 6 month surveillance here if she does have 2 areas of slightly elevated velocity in the vein graft. The proximal anastomosis has an area of 210 cm/s. Return in 6 months

## 2011-07-26 NOTE — Progress Notes (Signed)
Addended by: Sharee Pimple on: 07/26/2011 04:22 PM   Modules accepted: Orders

## 2011-08-02 NOTE — Procedures (Unsigned)
BYPASS GRAFT EVALUATION  INDICATION:  Followup peripheral vascular disease.  HISTORY: Diabetes:  Yes. Cardiac:  CABG. Hypertension:  Yes. Smoking:  No. Previous Surgery:  Right femoral to distal posterior tibial artery bypass graft on 08/31/2011; left above knee amputation in 2006.  SINGLE LEVEL ARTERIAL EXAM                              RIGHT              LEFT Brachial: Anterior tibial: Posterior tibial: Peroneal: Ankle/brachial index:   LOWER EXTREMITY BYPASS GRAFT DUPLEX EXAM:  DUPLEX:  Elevated velocities present of the right distal anastomosis with a ratio of >2.1 and a peak systolic velocity of 156 cm/s suggesting 50%-75% stenosis. Elevated velocity present in the right proximal superficial femoral artery suggestive of 50%-75% stenosis with a peak systolic velocity of 214 cm/s.  IMPRESSION: 1. Stenosis present as noted above. 2. Patent right femoral to posterior tibial bypass graft. 3. Right ankle brachial index is 1.05 and considered unchanged since     previous study on 07/22/2010.  ___________________________________________ Quita Skye. Hart Rochester, M.D.  SH/MEDQ  D:  07/26/2011  T:  07/26/2011  Job:  [id number]

## 2011-08-05 ENCOUNTER — Encounter: Payer: Self-pay | Admitting: Family Medicine

## 2011-08-05 ENCOUNTER — Ambulatory Visit (INDEPENDENT_AMBULATORY_CARE_PROVIDER_SITE_OTHER): Payer: Medicare Other | Admitting: Family Medicine

## 2011-08-05 DIAGNOSIS — M6281 Muscle weakness (generalized): Secondary | ICD-10-CM

## 2011-08-05 DIAGNOSIS — R29898 Other symptoms and signs involving the musculoskeletal system: Secondary | ICD-10-CM

## 2011-08-05 DIAGNOSIS — I10 Essential (primary) hypertension: Secondary | ICD-10-CM

## 2011-08-05 DIAGNOSIS — I509 Heart failure, unspecified: Secondary | ICD-10-CM

## 2011-08-05 DIAGNOSIS — I739 Peripheral vascular disease, unspecified: Secondary | ICD-10-CM

## 2011-08-05 DIAGNOSIS — K5900 Constipation, unspecified: Secondary | ICD-10-CM

## 2011-08-05 DIAGNOSIS — E119 Type 2 diabetes mellitus without complications: Secondary | ICD-10-CM

## 2011-08-05 NOTE — Progress Notes (Signed)
77 yo with h/o DM, CRI, PVD, CAD here for follow up DM, HTN.  DM does not check CBGs, no hypoglycemic episodes. Taking Actros 15 mg without issue.   Lab Results  Component Value Date   HGBA1C 6.7* 01/28/2010     HTN- She has had hypertension for over 35 years. last medication adjustment in June.   BP typically elevated here, BPs managed by cardiology. BP Readings from Last 3 Encounters:  08/05/11 160/60  07/26/11 198/59  05/30/11 130/60    Has actually been doing quite well with addition of Clonidine.   Denies any CP, SOB blurred vision.    Chronic kidney disease-  Lab Results  Component Value Date   CREATININE 2.2* 06/02/2011   PVD-  Last saw Dr. Hart Rochester earlier this month Nicely functioning femoral to distal posterior tibial vein graft placed in 2002.  Has significant fatigue of bilateral legs/hips due to PVD s/p left AKA. Would like a hover round.     Patient Active Problem List  Diagnoses  . DIABETES MELLITUS, TYPE II  . NEPHROPATHY, DIABETIC  . HYPERTENSION  . MYOCARDIAL INFARCTION, HX OF  . CONGESTIVE HEART FAILURE  . PULMONARY EDEMA  . CONSTIPATION  . HEMOCCULT POSITIVE STOOL  . CHRONIC KIDNEY DISEASE UNSPECIFIED  . PVD (peripheral vascular disease)  . Murmur, heart  . CAD (coronary artery disease)   Past Medical History  Diagnosis Date  . CHF (congestive heart failure) 2002    acute CHF while getting lower extremilty arteriograms /  Led to CABG  x6  . Hypertension   . PVD (peripheral vascular disease)     extensive PVD / left AKA  . Murmur, heart     soft systolic outflow murmur and a grade 2/6 murmur of the aortic insufficiency  . S/P AKA (above knee amputation)     left AKA with prosthesis  . DIABETES MELLITUS, TYPE II   . NEPHROPATHY, DIABETIC   . Coronary artery disease 2002    CABG x6 / left internal mammary artery graft to the LAD, a saphenous vein graft to the diagonal, a sequential saphenous vein graft to the 1st and 2nd obtuse marginal  branches, and a sequential saphenous vein graft the the distal right coronary artery and posterior descending  . Anemia    Past Surgical History  Procedure Date  . Coronary artery bypass graft 2002    x6 per Dr. Laneta Simmers  . Eye surgery 1987 & 1988     left eye  . Vascular surgery     multiple peripheral vascular surgeries  . Left aka 2006  . Right renal artery stent 2004  . Rfpbpg 2002   History  Substance Use Topics  . Smoking status: Never Smoker   . Smokeless tobacco: Never Used  . Alcohol Use: No   Family History  Problem Relation Age of Onset  . Coronary artery disease      prevalent in sibling  . Heart disease Mother    No Known Allergies Current Outpatient Prescriptions on File Prior to Visit  Medication Sig Dispense Refill  . amlodipine-atorvastatin (CADUET) 10-10 MG per tablet Take 1 tablet by mouth daily.        [name] aspirin 81 MG tablet Take 81 mg by mouth daily.        [name] atenolol (TENORMIN) 50 MG tablet Take 50 mg by mouth daily.        . cloNIDine (CATAPRES) 0.1 MG tablet Take 0.1 mg by mouth at bedtime.        [name]  cloNIDine (CATAPRES) 0.2 MG tablet Take 0.2 mg by mouth 2 (two) times daily.        . cloNIDine (CATAPRES) 0.3 MG tablet Take 0.3 mg by mouth 2 (two) times daily.        . famotidine (PEPCID) 20 MG tablet Take 20 mg by mouth daily.        . hydrALAZINE (APRESOLINE) 50 MG tablet Take 100 mg by mouth 2 (two) times daily.       . multivitamin (THERAGRAN) per tablet Take 1 tablet by mouth daily.        . pioglitazone (ACTOS) 15 MG tablet Take 15 mg by mouth daily.        . ramipril (ALTACE) 10 MG capsule Take 10 mg by mouth daily.        [name] spironolactone (ALDACTONE) 25 MG tablet Take 25 mg by mouth daily.         The PMH, PSH, Social History, Family History, Medications, and allergies have been reviewed in [facility], and have been updated if relevant.   Review of Systems       See HPI General:  Denies malaise. CV:  Denies chest pain or discomfort. Resp:   Denies shortness of breath. MS:  Denies joint pain, joint redness, joint swelling, and muscle aches.  Physical Exam BP 160/60  Pulse 74  Temp(Src) 98 F (36.7 C) (Oral)  Wt 117 lb 8 oz (53.298 kg)  General:  alert, well-developed, well-nourished, and well-hydrated.   Lungs:  normal respiratory effort, no intercostal retractions, no accessory muscle use, and normal breath sounds.   Heart:  irregular rhythm.   Extremities:  no edema in R leg, left leg artificial. Psych:  normally interactive, good eye contact, not anxious appearing, and not depressed appearing.    Assessment and Plan: 1. PVD (peripheral vascular disease)  Stable, agree she would be a good candidate for a hover round. Faxed company to send [place] forms to fill out.   2. HYPERTENSION   Stable for her.  Tends to run high but she is asympomatic.  3. Leg weakness   Stable, see #1.

## 2011-08-10 ENCOUNTER — Other Ambulatory Visit: Payer: Self-pay | Admitting: Nurse Practitioner

## 2011-08-22 ENCOUNTER — Ambulatory Visit (INDEPENDENT_AMBULATORY_CARE_PROVIDER_SITE_OTHER): Payer: Medicare Other | Admitting: Family Medicine

## 2011-08-22 ENCOUNTER — Encounter: Payer: Self-pay | Admitting: Family Medicine

## 2011-08-22 VITALS — BP 140/70 | HR 65 | Temp 98.5°F | Ht 62.0 in | Wt 117.5 lb

## 2011-08-22 DIAGNOSIS — S88919A Complete traumatic amputation of unspecified lower leg, level unspecified, initial encounter: Secondary | ICD-10-CM

## 2011-08-22 DIAGNOSIS — R29898 Other symptoms and signs involving the musculoskeletal system: Secondary | ICD-10-CM

## 2011-08-22 DIAGNOSIS — Z7409 Other reduced mobility: Secondary | ICD-10-CM | POA: Insufficient documentation

## 2011-08-22 DIAGNOSIS — E119 Type 2 diabetes mellitus without complications: Secondary | ICD-10-CM

## 2011-08-22 DIAGNOSIS — M6281 Muscle weakness (generalized): Secondary | ICD-10-CM

## 2011-08-22 DIAGNOSIS — Z89619 Acquired absence of unspecified leg above knee: Secondary | ICD-10-CM

## 2011-08-22 DIAGNOSIS — I739 Peripheral vascular disease, unspecified: Secondary | ICD-10-CM

## 2011-08-22 DIAGNOSIS — I509 Heart failure, unspecified: Secondary | ICD-10-CM

## 2011-08-22 NOTE — Progress Notes (Signed)
77 yo with h/o DM, CRI, PVD, CAD here mobility evaluation.     Patient has significant fatigue of bilateral legs/hips due to PVD s/p left AKA,  CHF, and PVD.  She is impaired from moving room to room and also has a h/o unsteady gait and poor balance.  As she only has one leg, it is very difficult for her to steer or balance her weight/propel in a manual wheelchair. She also has decreased grip strength of her hands and significant diabetic neuropathy in her remaining leg.  She lacks the postural stability to use a scooter (POV).  Artificial leg is heavy to maneuver as well.  Has a difficult time carrying leg for transfers.   Patient Active Problem List  Diagnoses  . DIABETES MELLITUS, TYPE II  . NEPHROPATHY, DIABETIC  . HYPERTENSION  . MYOCARDIAL INFARCTION, HX OF  . CONGESTIVE HEART FAILURE  . PULMONARY EDEMA  . CONSTIPATION  . HEMOCCULT POSITIVE STOOL  . CHRONIC KIDNEY DISEASE UNSPECIFIED  . PVD (peripheral vascular disease)  . Murmur, heart  . CAD (coronary artery disease)  . Leg weakness   Past Medical History  Diagnosis Date  . CHF (congestive heart failure) 2002    acute CHF while getting lower extremilty arteriograms /  Led to CABG  x6  . Hypertension   . PVD (peripheral vascular disease)     extensive PVD / left AKA  . Murmur, heart     soft systolic outflow murmur and a grade 2/6 murmur of the aortic insufficiency  . S/P AKA (above knee amputation)     left AKA with prosthesis  . DIABETES MELLITUS, TYPE II   . NEPHROPATHY, DIABETIC   . Coronary artery disease 2002    CABG x6 / left internal mammary artery graft to the LAD, a saphenous vein graft to the diagonal, a sequential saphenous vein graft to the 1st and 2nd obtuse marginal branches, and a sequential saphenous vein graft the the distal right coronary artery and posterior descending  . Anemia    Past Surgical History  Procedure Date  . Coronary artery bypass graft 2002    x6 per Dr. Laneta Simmers  . Eye  surgery 1987 & 1988     left eye  . Vascular surgery     multiple peripheral vascular surgeries  . Left aka 2006  . Right renal artery stent 2004  . Rfpbpg 2002   History  Substance Use Topics  . Smoking status: Never Smoker   . Smokeless tobacco: Never Used  . Alcohol Use: No   Family History  Problem Relation Age of Onset  . Coronary artery disease      prevalent in sibling  . Heart disease Mother    No Known Allergies Current Outpatient Prescriptions on File Prior to Visit  Medication Sig Dispense Refill  . amlodipine-atorvastatin (CADUET) 10-10 MG per tablet Take 1 tablet by mouth daily.        [name] aspirin 81 MG tablet Take 81 mg by mouth daily.        [name] atenolol (TENORMIN) 50 MG tablet Take 50 mg by mouth daily.        . cloNIDine (CATAPRES) 0.2 MG tablet Take 0.2 mg by mouth 2 (two) times daily.        . famotidine (PEPCID) 20 MG tablet Take 20 mg by mouth daily.        . hydrALAZINE (APRESOLINE) 50 MG tablet Take 100 mg by mouth 2 (two) times daily.       [name]  multivitamin (THERAGRAN) per tablet Take 1 tablet by mouth daily.        . pioglitazone (ACTOS) 15 MG tablet Take 15 mg by mouth daily.        . ramipril (ALTACE) 10 MG capsule Take 10 mg by mouth daily.        [name] spironolactone (ALDACTONE) 25 MG tablet Take 25 mg by mouth daily.         The PMH, PSH, Social History, Family History, Medications, and allergies have been reviewed in [facility], and have been updated if relevant.   Review of Systems       See HPI  Physical Exam BP 140/70  Pulse 65  Temp(Src) 98.5 F (36.9 C) (Oral)  Ht 5\' 2"  (1.575 m)  Wt 117 lb 8 oz (53.298 kg)  BMI 21.49 kg/m2  General:  alert, well-developed, well-nourished, and well-hydrated.   Lungs:  normal respiratory effort, no intercostal retractions, no accessory muscle use, and normal breath sounds.   Heart:  irregular rhythm.   Extremities:  no edema in R leg, left leg artificial.  Decreased ROM of right leg and gait unsteady. Psych:   normally interactive, good eye contact, not anxious appearing, and not depressed appearing.   Neuro:  UE strength 2/5 bilaterally, Right LE strength 2/5  Assessment and Plan:  1 Impaired mobility    She is very willing and motivated to use a power wheelchair. She also is very mentally competent and has a supportive family.   Order willed out for mobility device and faxed directly to [employer].

## 2011-08-25 ENCOUNTER — Encounter (INDEPENDENT_AMBULATORY_CARE_PROVIDER_SITE_OTHER): Payer: Medicare Other | Admitting: Ophthalmology

## 2011-08-25 DIAGNOSIS — H43819 Vitreous degeneration, unspecified eye: Secondary | ICD-10-CM

## 2011-08-25 DIAGNOSIS — H348392 Tributary (branch) retinal vein occlusion, unspecified eye, stable: Secondary | ICD-10-CM

## 2011-08-25 DIAGNOSIS — H35039 Hypertensive retinopathy, unspecified eye: Secondary | ICD-10-CM

## 2011-08-25 DIAGNOSIS — H3530 Unspecified macular degeneration: Secondary | ICD-10-CM

## 2011-08-31 HISTORY — PX: PR VEIN BYPASS GRAFT,AORTO-FEM-POP: 35551

## 2011-09-08 ENCOUNTER — Encounter: Payer: Medicare Other | Admitting: Family Medicine

## 2011-09-08 ENCOUNTER — Encounter: Payer: Self-pay | Admitting: Family Medicine

## 2011-09-08 NOTE — Progress Notes (Signed)
Cancelled.  

## 2011-09-08 NOTE — Progress Notes (Signed)
Addendum to previous note. Received a letter stating that we need to address why pt cannot use a cane or walker.  77 yo with h/o DM, CRI, PVD, CAD here mobility evaluation.     Patient has significant fatigue of bilateral legs/hips due to PVD s/p left AKA,  CHF, and PVD.  She is impaired from moving room to room and also has a h/o unsteady gait and poor balance.  As she only has one leg, it is very difficult for her to steer or balance her weight/propel in a manual wheelchair. She also has decreased grip strength of her hands and significant diabetic neuropathy in her remaining leg prohibiting prolonged use of cane and walker. She has has decreased upper body strength.  She lacks the postural stability to use a scooter (POV).  Artificial leg is heavy to maneuver as well.  Has a difficult time carrying leg for transfers, particulary with using a cane or walker.   Patient Active Problem List  Diagnoses  . DIABETES MELLITUS, TYPE II  . NEPHROPATHY, DIABETIC  . HYPERTENSION  . MYOCARDIAL INFARCTION, HX OF  . CONGESTIVE HEART FAILURE  . PULMONARY EDEMA  . CONSTIPATION  . HEMOCCULT POSITIVE STOOL  . CHRONIC KIDNEY DISEASE UNSPECIFIED  . PVD (peripheral vascular disease)  . Murmur, heart  . CAD (coronary artery disease)  . Leg weakness  . Hx of leg amputation  . Impaired mobility   Past Medical History  Diagnosis Date  . CHF (congestive heart failure) 2002    acute CHF while getting lower extremilty arteriograms /  Led to CABG  x6  . Hypertension   . PVD (peripheral vascular disease)     extensive PVD / left AKA  . Murmur, heart     soft systolic outflow murmur and a grade 2/6 murmur of the aortic insufficiency  . S/P AKA (above knee amputation)     left AKA with prosthesis  . DIABETES MELLITUS, TYPE II   . NEPHROPATHY, DIABETIC   . Coronary artery disease 2002    CABG x6 / left internal mammary artery graft to the LAD, a saphenous vein graft to the diagonal, a sequential  saphenous vein graft to the 1st and 2nd obtuse marginal branches, and a sequential saphenous vein graft the the distal right coronary artery and posterior descending  . Anemia    Past Surgical History  Procedure Date  . Coronary artery bypass graft 2002    x6 per Dr. Laneta Simmers  . Eye surgery 1987 & 1988     left eye  . Vascular surgery     multiple peripheral vascular surgeries  . Left aka 2006  . Right renal artery stent 2004  . Rfpbpg 2002   History  Substance Use Topics  . Smoking status: Never Smoker   . Smokeless tobacco: Never Used  . Alcohol Use: No   Family History  Problem Relation Age of Onset  . Coronary artery disease      prevalent in sibling  . Heart disease Mother    No Known Allergies Current Outpatient Prescriptions on File Prior to Visit  Medication Sig Dispense Refill  . amlodipine-atorvastatin (CADUET) 10-10 MG per tablet Take 1 tablet by mouth daily.        [name] aspirin 81 MG tablet Take 81 mg by mouth daily.        [name] atenolol (TENORMIN) 50 MG tablet Take 50 mg by mouth daily.        . cloNIDine (CATAPRES) 0.2 MG  tablet Take 0.2 mg by mouth 2 (two) times daily.        . famotidine (PEPCID) 20 MG tablet Take 20 mg by mouth daily.        . hydrALAZINE (APRESOLINE) 50 MG tablet Take 100 mg by mouth 2 (two) times daily.       . multivitamin (THERAGRAN) per tablet Take 1 tablet by mouth daily.        . pioglitazone (ACTOS) 15 MG tablet Take 15 mg by mouth daily.        . ramipril (ALTACE) 10 MG capsule Take 10 mg by mouth daily.        [name] spironolactone (ALDACTONE) 25 MG tablet Take 25 mg by mouth daily.         The PMH, PSH, Social History, Family History, Medications, and allergies have been reviewed in [facility], and have been updated if relevant.   Review of Systems       See HPI  Physical Exam Wt Readings from Last 3 Encounters:  08/22/11 117 lb 8 oz (53.298 kg)  08/05/11 117 lb 8 oz (53.298 kg)  07/26/11 118 lb (53.524 kg)   Temp Readings from Last 3  Encounters:  08/22/11 98.5 F (36.9 C) Oral  08/05/11 98 F (36.7 C) Oral   BP Readings from Last 3 Encounters:  08/22/11 140/70  08/05/11 160/60  07/26/11 198/59   Pulse Readings from Last 3 Encounters:  08/22/11 65  08/05/11 74  07/26/11 69    General:  alert, well-developed, well-nourished, and well-hydrated.   Lungs:  normal respiratory effort, no intercostal retractions, no accessory muscle use, and normal breath sounds.   Heart:  irregular rhythm.   Extremities:  no edema in R leg, left leg artificial.  Decreased ROM of right leg and gait unsteady. Psych:  normally interactive, good eye contact, not anxious appearing, and not depressed appearing.   Neuro:  UE strength 2/5 bilaterally, Right LE strength 2/5  Assessment and Plan:  1 Impaired mobility    She is very willing and motivated to use a power wheelchair. She also is very mentally competent and has a supportive family.   Order willed out for mobility device and faxed directly to [employer].

## 2011-09-11 ENCOUNTER — Other Ambulatory Visit: Payer: Self-pay | Admitting: Nurse Practitioner

## 2011-09-12 MED ORDER — CLONIDINE HCL 0.3 MG PO TABS: 0.3000 mg | ORAL_TABLET | Freq: Two times a day (BID) | ORAL | Status: DC

## 2011-09-16 ENCOUNTER — Telehealth: Payer: Self-pay | Admitting: *Deleted

## 2011-09-16 NOTE — Telephone Encounter (Signed)
I called [employer] to see what information was missing from the previous addendum note that Dr. Dayton Martes wrote on 09/08/2011.  Rep advised me that Dr. Dayton Martes needs to write a statement as to whether patient can or can't do a functional weight shift and she needs to date the items that she corrected and initialed on the written order under the ICD-9 code column.  Please use today's date 09/16/2011 when making the correction.  Written order form in your IN box.

## 2011-09-16 NOTE — Telephone Encounter (Signed)
Letter and order form faxed to [facility].

## 2011-09-16 NOTE — Telephone Encounter (Signed)
Letter writtent

## 2011-09-18 ENCOUNTER — Other Ambulatory Visit: Payer: Self-pay | Admitting: Nurse Practitioner

## 2011-09-19 ENCOUNTER — Telehealth: Payer: Self-pay | Admitting: Nurse Practitioner

## 2011-09-19 NOTE — Telephone Encounter (Signed)
Pt's dtr calling re [facility] not carrying amlodipine anymore, they do not want to change pharmacies, so they want something comparable that's not more expensive

## 2011-09-21 ENCOUNTER — Telehealth: Payer: Self-pay | Admitting: *Deleted

## 2011-09-21 NOTE — Telephone Encounter (Signed)
Spoke with rep from [facility] and explained to her that Dr. Dayton Martes has already received and signed the order form for the [facility].  She advised me that there is still corrections to be made to the office visit note.  The rep I spoke with didn't seem very knowledgeable of the situation and I could hear someone in the back ground advising her what to tell me.  I requested a written form explaining in detail what areas needed corrections by Dr. Dayton Martes.  I verified our fax number [phone] and the rep will fax it today.

## 2011-09-21 NOTE — Telephone Encounter (Signed)
Thank you :)

## 2011-09-21 NOTE — Telephone Encounter (Signed)
We already did this

## 2011-09-21 NOTE — Telephone Encounter (Signed)
Need addendum added to 08/22/11 and 09/08/11 notes that state why pt is unable to use cane/ walker  and your sig, info was faxed with this request as well.

## 2011-09-26 ENCOUNTER — Encounter: Payer: Self-pay | Admitting: *Deleted

## 2011-09-29 ENCOUNTER — Telehealth: Payer: Self-pay | Admitting: *Deleted

## 2011-09-29 NOTE — Telephone Encounter (Signed)
Per Dr. Elmer Sow request we will no longer fill out any paper work from [facility] on the behalf of Aleicia Kenagy.  We have been going back and forth with [employer] for some time now and are still unable to get patient approved for a [employer].  Dr. Dayton Martes is more than willing to assist patient in getting a power chair from another company but not from [facility].  Patient advised via telephone and she stated that she appreciates our efforts and she would like a power chair if we can help her find another company.  Rep from [facility] advised as well.

## 2011-10-13 ENCOUNTER — Other Ambulatory Visit: Payer: Self-pay | Admitting: Nurse Practitioner

## 2011-10-17 ENCOUNTER — Telehealth: Payer: Self-pay

## 2011-10-17 NOTE — Telephone Encounter (Signed)
Patient's sister Merlyn Lot was called ,advised patient should be taking clonidine 0.2mg  in the am and 0.3 mg at night.Stated she would call back if she needs any refills she would have to check with her daughter.

## 2011-10-20 ENCOUNTER — Telehealth: Payer: Self-pay | Admitting: Nurse Practitioner

## 2011-10-20 NOTE — Telephone Encounter (Signed)
Fu call Please call refill for Cl[facility]idine [facility] in [place] She is out of pills

## 2011-10-20 NOTE — Telephone Encounter (Signed)
REFILLED CLONIDINE AT [facility] WITH 3 REMAINING REFILLS

## 2011-11-11 ENCOUNTER — Other Ambulatory Visit: Payer: Self-pay | Admitting: Nurse Practitioner

## 2012-01-10 ENCOUNTER — Encounter: Payer: Self-pay | Admitting: Nurse Practitioner

## 2012-01-10 ENCOUNTER — Ambulatory Visit (INDEPENDENT_AMBULATORY_CARE_PROVIDER_SITE_OTHER): Payer: Medicare Other | Admitting: Nurse Practitioner

## 2012-01-10 VITALS — BP 182/60 | HR 68 | Resp 20 | Ht 65.0 in | Wt 112.0 lb

## 2012-01-10 DIAGNOSIS — I2510 Atherosclerotic heart disease of native coronary artery without angina pectoris: Secondary | ICD-10-CM

## 2012-01-10 DIAGNOSIS — I10 Essential (primary) hypertension: Secondary | ICD-10-CM

## 2012-01-10 DIAGNOSIS — I739 Peripheral vascular disease, unspecified: Secondary | ICD-10-CM

## 2012-01-10 DIAGNOSIS — N189 Chronic kidney disease, unspecified: Secondary | ICD-10-CM

## 2012-01-10 MED ORDER — FUROSEMIDE 40 MG PO TABS: 40.0000 mg | ORAL_TABLET | Freq: Every day | ORAL | Status: DC

## 2012-01-10 NOTE — Patient Instructions (Addendum)
Don't take the Ramipril or the Spironalactone any more. These were stopped due to worsening kidney function.  We are going to put you on Lasix 40 mg each morning.  Watch your salt.  We are going to check your labs today.   I want to see you back on March 12th and recheck your labs.   Call the [facility] office at [phone] if you have any questions, problems or concerns.

## 2012-01-10 NOTE — Assessment & Plan Note (Signed)
She says she is taking her medicines. I looked at her bottles. Some have had no refills since last year. I do not think she needs to be on the Altace and Aldactone and these are stopped today. She did have worsening kidney function. We will recheck her labs today. She does not want much done for her and we will continue with our conservative approach. I have put her on Lasix 40 mg daily. Will see her back in about 2 weeks. Will recheck labs on return as well. Patient is agreeable to this plan and will call if any problems develop in the interim.

## 2012-01-10 NOTE — Assessment & Plan Note (Signed)
We will recheck her labs today.

## 2012-01-10 NOTE — Assessment & Plan Note (Signed)
No chest pain reported. She tries to remain active. Will continue with our current plan of care.

## 2012-01-10 NOTE — Assessment & Plan Note (Signed)
She has planned follow up at [facility] next month.

## 2012-01-10 NOTE — Progress Notes (Signed)
Adrienne Bowman Date of Birth: 10/26/1918 Medical Record #[id number]  History of Present Illness: Adrienne Bowman is seen back today for her 6 month check. She is a former patient of Dr. Ronnald Nian. She has been assigned to Dr. Antoine Poche. She is now 78 years of age. Has CAD, prior CABG, HTN, DM, PVD with left AKA.   She comes in today. She says she is doing just fine. Continues to try and be active. Has to use her walker. Denies any falls. She plays with her medicines. Compliance has always been an issue. Stopped her Actos for unknown reasons. Was taken off of her Altace and Aldactone last year due to worsening kidney failure. She is suppose to see Dr. [name] but I can't tell if she still goes. She has had more swelling of her right foot. She started her Altace and Aldactone back on her own over the last 3 weeks. She does not check her blood pressure. She really doesn't want much done for her and does not like to take her medicines. She has had no recent labs. She is not having chest pain. She says she is not short of breath. Denies being lightheaded or dizzy.   Current Outpatient Prescriptions on File Prior to Visit  Medication Sig Dispense Refill  . aspirin 81 MG tablet Take 81 mg by mouth daily.        [name] atenolol (TENORMIN) 50 MG tablet Take 50 mg by mouth daily.        [name] CADUET 10-10 MG per tablet TAKE ONE TABLET BY MOUTH EVERY DAY  30 each  4  . cloNIDine (CATAPRES) 0.3 MG tablet Take 1 tablet (0.3 mg total) by mouth 2 (two) times daily.  60 tablet  4  . famotidine (PEPCID) 20 MG tablet TAKE ONE TABLET BY MOUTH EVERY DAY  30 tablet  4  . hydrALAZINE (APRESOLINE) 100 MG tablet TAKE ONE TABLET BY MOUTH TWICE DAILY  60 tablet  5  . multivitamin (THERAGRAN) per tablet Take 1 tablet by mouth daily.        . ramipril (ALTACE) 10 MG capsule Take 10 mg by mouth daily.        [name] spironolactone (ALDACTONE) 25 MG tablet Take 25 mg by mouth daily.          No Known Allergies  Past Medical History  Diagnosis  Date  . CHF (congestive heart failure) 2002    acute CHF while getting lower extremilty arteriograms /  Led to CABG  x6  . Hypertension   . PVD (peripheral vascular disease)     extensive PVD / left AKA  . Murmur, heart     soft systolic outflow murmur and a grade 2/6 murmur of the aortic insufficiency  . S/P AKA (above knee amputation)     left AKA with prosthesis  . DIABETES MELLITUS, TYPE II   . NEPHROPATHY, DIABETIC   . Coronary artery disease 2002    CABG x6 / left internal mammary artery graft to the LAD, a saphenous vein graft to the diagonal, a sequential saphenous vein graft to the 1st and 2nd obtuse marginal branches, and a sequential saphenous vein graft the the distal right coronary artery and posterior descending  . Anemia   . CKD (chronic kidney disease)   . Advanced age     Past Surgical History  Procedure Date  . Coronary artery bypass graft 2002    x6 per Dr. Laneta Simmers  . Eye surgery 1987 & 1988  left eye  . Vascular surgery     multiple peripheral vascular surgeries  . Left aka 2006  . Right renal artery stent 2004  . Rfpbpg 2002    History  Smoking status  . Never Smoker   Smokeless tobacco  . Never Used    History  Alcohol Use No    Family History  Problem Relation Age of Onset  . Coronary artery disease      prevalent in sibling  . Heart disease Mother     Review of Systems: The review of systems is positive for swelling of the right foot.  All other systems were reviewed and are negative.  Physical Exam: BP 182/60  Pulse 68  Resp 20  Ht 5\' 5"  (1.651 m)  Wt 112 lb (50.803 kg)  BMI 18.64 kg/m2 Repeat blood pressure by me is 180/60 in both arms. Patient is very pleasant and in no acute distress. She looks younger than her stated age. She is using a walker. Skin is warm and dry. Color is normal.  HEENT is unremarkable. Normocephalic/atraumatic. PERRL. Sclera are nonicteric. Neck is supple. No masses. No JVD. Lungs are fairly clear.  Cardiac exam shows a regular rate and rhythm. She has an outflow murmur.  Abdomen is soft. Extremities are show trace edema on the right. L AKA noted. Gait and ROM are intact. No gross neurologic deficits noted.  LABORATORY DATA: PENDING   Assessment / Plan:

## 2012-01-11 LAB — BASIC METABOLIC PANEL
BUN: 47 mg/dL — ABNORMAL HIGH (ref 6–23)
CO2: 30 mEq/L (ref 19–32)
Calcium: 9.6 mg/dL (ref 8.4–10.5)
Chloride: 106 mEq/L (ref 96–112)
Creatinine, Ser: 1.9 mg/dL — ABNORMAL HIGH (ref 0.4–1.2)
GFR: 32.43 mL/min — ABNORMAL LOW (ref >60.00)
Glucose, Bld: 135 mg/dL — ABNORMAL HIGH (ref 70–99)
Potassium: 4.3 mEq/L (ref 3.5–5.1)
Sodium: 139 mEq/L (ref 135–145)

## 2012-01-24 ENCOUNTER — Ambulatory Visit: Payer: Medicare Other | Admitting: Nurse Practitioner

## 2012-02-01 ENCOUNTER — Encounter: Payer: Self-pay | Admitting: Nurse Practitioner

## 2012-02-01 ENCOUNTER — Ambulatory Visit (INDEPENDENT_AMBULATORY_CARE_PROVIDER_SITE_OTHER): Payer: Medicare Other | Admitting: Nurse Practitioner

## 2012-02-01 VITALS — BP 180/60 | HR 70 | Ht 65.0 in | Wt 116.0 lb

## 2012-02-01 DIAGNOSIS — I739 Peripheral vascular disease, unspecified: Secondary | ICD-10-CM

## 2012-02-01 DIAGNOSIS — I2510 Atherosclerotic heart disease of native coronary artery without angina pectoris: Secondary | ICD-10-CM

## 2012-02-01 DIAGNOSIS — I10 Essential (primary) hypertension: Secondary | ICD-10-CM

## 2012-02-01 LAB — BASIC METABOLIC PANEL
BUN: 47 mg/dL — ABNORMAL HIGH (ref 6–23)
CO2: 30 mEq/L (ref 19–32)
Calcium: 8.6 mg/dL (ref 8.4–10.5)
Chloride: 96 mEq/L (ref 96–112)
Creatinine, Ser: 2 mg/dL — ABNORMAL HIGH (ref 0.4–1.2)
GFR: 30.01 mL/min — ABNORMAL LOW (ref >60.00)
Glucose, Bld: 293 mg/dL — ABNORMAL HIGH (ref 70–99)
Potassium: 3.2 mEq/L — ABNORMAL LOW (ref 3.5–5.1)
Sodium: 135 mEq/L (ref 135–145)

## 2012-02-01 MED ORDER — POTASSIUM CHLORIDE CRYS ER 20 MEQ PO TBCR: 20.0000 meq | EXTENDED_RELEASE_TABLET | Freq: Every day | ORAL | Status: DC

## 2012-02-01 NOTE — Patient Instructions (Addendum)
Stay on your current medicines. Watch your salt. Try to stay active.   We will see you back in about 4 months.   We will get your appointment with [facility] rescheduled.   Call the [facility] office at [phone] if you have any questions, problems or concerns.

## 2012-02-01 NOTE — Progress Notes (Signed)
Addended by: Worthy Rancher D on: 02/01/2012 05:35 PM   Modules accepted: Orders

## 2012-02-01 NOTE — Assessment & Plan Note (Addendum)
Blood pressure is still high. She did not take her medicines this morning. I explained that I would really like to reassess her when she has had her medicines. She did bring them with her. She took them here in the office. Will watch her for a bit. She really does not want to come back anytime soon. Will recheck a BMET today as well.   She was watched for 30 minutes after taking her medicines. Blood pressure is down to 164/60. I will see her back in 4 months. Patient is agreeable to this plan and will call if any problems develop in the interim.

## 2012-02-01 NOTE — Progress Notes (Signed)
[name] Date of Birth: 1918/08/27 Medical Record #[id number]  History of Present Illness: Adrienne Bowman is seen back today for a follow up visit. She is seen for Dr. Antoine Poche. She has known CAD, prior CABG, HTN, DM, PVD with left AKA. She was here last month. Blood pressure was up. Her medicines were not right. She had restarted her Altace and Aldactone on her own due to some swelling. These had previously been stopped due to worsening kidney function. We have attempted to fix her medicines. Lasix was started. She comes back today for follow up.  She comes in today. She is here with her family member. She is doing well. Remains asymptomatic. She has never wanted much done for her and does not like to take her medicines. Using her walker for ambulation. She did not take any of her medicines today because she was traveling. She feels fine.   Current Outpatient Prescriptions on File Prior to Visit  Medication Sig Dispense Refill  . aspirin 81 MG tablet Take 81 mg by mouth daily.        [name] atenolol (TENORMIN) 50 MG tablet Take 50 mg by mouth daily.        [name] CADUET 10-10 MG per tablet TAKE ONE TABLET BY MOUTH EVERY DAY  30 each  4  . cloNIDine (CATAPRES) 0.3 MG tablet Take 1 tablet (0.3 mg total) by mouth 2 (two) times daily.  60 tablet  4  . famotidine (PEPCID) 20 MG tablet TAKE ONE TABLET BY MOUTH EVERY DAY  30 tablet  4  . furosemide (LASIX) 40 MG tablet Take 1 tablet (40 mg total) by mouth daily.  30 tablet  11  . hydrALAZINE (APRESOLINE) 100 MG tablet TAKE ONE TABLET BY MOUTH TWICE DAILY  60 tablet  5  . multivitamin (THERAGRAN) per tablet Take 1 tablet by mouth daily.        . ramipril (ALTACE) 10 MG capsule Take 10 mg by mouth daily.        [name] spironolactone (ALDACTONE) 25 MG tablet Take 25 mg by mouth daily.          No Known Allergies  Past Medical History  Diagnosis Date  . CHF (congestive heart failure) 2002    acute CHF while getting lower extremilty arteriograms /  Led to CABG   x6  . Hypertension   . PVD (peripheral vascular disease)     extensive PVD / left AKA  . Murmur, heart     soft systolic outflow murmur and a grade 2/6 murmur of the aortic insufficiency  . S/P AKA (above knee amputation)     left AKA with prosthesis  . DIABETES MELLITUS, TYPE II   . NEPHROPATHY, DIABETIC   . Coronary artery disease 2002    CABG x6 / left internal mammary artery graft to the LAD, a saphenous vein graft to the diagonal, a sequential saphenous vein graft to the 1st and 2nd obtuse marginal branches, and a sequential saphenous vein graft the the distal right coronary artery and posterior descending  . Anemia   . CKD (chronic kidney disease)   . Advanced age     Past Surgical History  Procedure Date  . Coronary artery bypass graft 2002    x6 per Dr. Laneta Simmers  . Eye surgery 1987 & 1988     left eye  . Vascular surgery     multiple peripheral vascular surgeries  . Left aka 2006  . Right renal artery stent 2004  .  Rfpbpg 2002    History  Smoking status  . Never Smoker   Smokeless tobacco  . Never Used    History  Alcohol Use No    Family History  Problem Relation Age of Onset  . Coronary artery disease      prevalent in sibling  . Heart disease Mother     Review of Systems: The review of systems is per the HPI.  She tries to stay active. All other systems were reviewed and are negative.  Physical Exam: Ht 5\' 5"  (1.651 m)  Wt 116 lb (52.617 kg)  BMI 19.30 kg/m2 Patient is very pleasant and in no acute distress. Looks younger than her stated age. Remains quite upbeat with her attitude. Skin is warm and dry. Color is normal.  HEENT is unremarkable. Normocephalic/atraumatic. PERRL. Sclera are nonicteric. Neck is supple. No masses. No JVD. Lungs are clear. Cardiac exam shows a regular rate and rhythm. Abdomen is soft. Extremities are without edema. Gait and ROM are intact. Left AKA noted. No gross neurologic deficits noted.    Lab Results  Component Value  Date   WBC 6.3 05/30/2011   HGB 10.7* 05/30/2011   HCT 31.5* 05/30/2011   PLT 234.0 05/30/2011   GLUCOSE 135* 01/10/2012   CHOL 107 05/30/2011   TRIG 178.0* 05/30/2011   HDL 29.20* 05/30/2011   LDLCALC 42 05/30/2011   ALT 15 05/30/2011   AST 21 05/30/2011   NA 139 01/10/2012   K 4.3 01/10/2012   CL 106 01/10/2012   CREATININE 1.9* 01/10/2012   BUN 47* 01/10/2012   CO2 30 01/10/2012   HGBA1C 6.7* 01/28/2010     Assessment / Plan:

## 2012-02-01 NOTE — Assessment & Plan Note (Signed)
No chest pain reported 

## 2012-02-01 NOTE — Assessment & Plan Note (Signed)
She missed her [facility] appointment last month. She says she is going to reschedule. We will try to facilitate.

## 2012-02-08 ENCOUNTER — Encounter (INDEPENDENT_AMBULATORY_CARE_PROVIDER_SITE_OTHER): Payer: Medicare Other | Admitting: *Deleted

## 2012-02-08 DIAGNOSIS — I70219 Atherosclerosis of native arteries of extremities with intermittent claudication, unspecified extremity: Secondary | ICD-10-CM

## 2012-02-08 DIAGNOSIS — I70409 Unspecified atherosclerosis of autologous vein bypass graft(s) of the extremities, unspecified extremity: Secondary | ICD-10-CM

## 2012-02-08 DIAGNOSIS — Z48812 Encounter for surgical aftercare following surgery on the circulatory system: Secondary | ICD-10-CM

## 2012-02-10 ENCOUNTER — Other Ambulatory Visit: Payer: Self-pay | Admitting: Nurse Practitioner

## 2012-02-13 ENCOUNTER — Encounter: Payer: Self-pay | Admitting: Vascular Surgery

## 2012-02-14 ENCOUNTER — Encounter: Payer: Self-pay | Admitting: *Deleted

## 2012-02-14 ENCOUNTER — Encounter: Payer: Self-pay | Admitting: Vascular Surgery

## 2012-02-14 ENCOUNTER — Other Ambulatory Visit: Payer: Self-pay | Admitting: *Deleted

## 2012-02-14 ENCOUNTER — Ambulatory Visit (INDEPENDENT_AMBULATORY_CARE_PROVIDER_SITE_OTHER): Payer: Medicare Other | Admitting: Vascular Surgery

## 2012-02-14 VITALS — BP 185/62 | HR 69 | Resp 16 | Ht 62.0 in | Wt 118.2 lb

## 2012-02-14 DIAGNOSIS — I70219 Atherosclerosis of native arteries of extremities with intermittent claudication, unspecified extremity: Secondary | ICD-10-CM

## 2012-02-14 NOTE — Progress Notes (Signed)
Subjective:     Patient ID: Adrienne Bowman, female   DOB: 11/07/1918, 78 y.o.   MRN: [id number]  HPI this 78-year-old female returns for followup regarding her right SFA to posterior tibial saphenous vein graft which I placed in 2002. She denies any symptoms in her right lower extremity such as rest pain nonhealing ulcers infection or cellulitis. She had a routine scan performed last week which revealed a severe stenosis in the distal end of the vein graft. ABI remains adequate at 0.98. There is a velocity of 709 cm/s and the distal end of the vein graft.  Past Medical History  Diagnosis Date  . CHF (congestive heart failure) 2002    acute CHF while getting lower extremilty arteriograms /  Led to CABG  x6  . Hypertension   . PVD (peripheral vascular disease)     extensive PVD / left AKA  . Murmur, heart     soft systolic outflow murmur and a grade 2/6 murmur of the aortic insufficiency  . S/P AKA (above knee amputation)     left AKA with prosthesis  . DIABETES MELLITUS, TYPE II   . NEPHROPATHY, DIABETIC   . Coronary artery disease 2002    CABG x6 / left internal mammary artery graft to the LAD, a saphenous vein graft to the diagonal, a sequential saphenous vein graft to the 1st and 2nd obtuse marginal branches, and a sequential saphenous vein graft the the distal right coronary artery and posterior descending  . Anemia   . CKD (chronic kidney disease)   . Advanced age     History  Substance Use Topics  . Smoking status: Never Smoker   . Smokeless tobacco: Never Used  . Alcohol Use: No    Family History  Problem Relation Age of Onset  . Coronary artery disease      prevalent in sibling  . Heart disease Mother     No Known Allergies  Current outpatient prescriptions:aspirin 81 MG tablet, Take 81 mg by mouth daily.  , Disp: , Rfl: ;  atenolol (TENORMIN) 50 MG tablet, TAKE ONE TABLET BY MOUTH EVERY DAY, Disp: 30 tablet, Rfl: 6;  CADUET 10-10 MG per tablet, TAKE ONE TABLET BY  MOUTH EVERY DAY, Disp: 30 each, Rfl: 4;  cloNIDine (CATAPRES) 0.3 MG tablet, TAKE ONE TABLET BY MOUTH TWICE DAILY, Disp: 60 tablet, Rfl: 6 famotidine (PEPCID) 20 MG tablet, TAKE ONE TABLET BY MOUTH EVERY DAY, Disp: 30 tablet, Rfl: 4;  furosemide (LASIX) 40 MG tablet, Take 1 tablet (40 mg total) by mouth daily., Disp: 30 tablet, Rfl: 11;  hydrALAZINE (APRESOLINE) 100 MG tablet, TAKE ONE TABLET BY MOUTH TWICE DAILY, Disp: 60 tablet, Rfl: 5;  multivitamin (THERAGRAN) per tablet, Take 1 tablet by mouth daily.  , Disp: , Rfl:  potassium chloride SA (K-DUR,KLOR-CON) 20 MEQ tablet, Take 1 tablet (20 mEq total) by mouth daily., Disp: 30 tablet, Rfl: 6  BP 185/62  Pulse 69  Resp 16  Ht 5' 2" (1.575 m)  Wt 118 lb 3.2 oz (53.615 kg)  BMI 21.62 kg/m2  SpO2 100%  Body mass index is 21.62 kg/(m^2).          Review of Systems denies chest pain, dyspnea on exertion, PND, orthopnea, chronic bronchitis, hemoptysis.    Objective:   Physical Exam pressure 185/62 heart rate 69 respirations 16 General elderly female no apparent stress alert and oriented x3 Chest no rhonchi or wheezing Cardiovascular regular rhythm no murmurs carotid pulses 3+ no   audible bruits Neurologic normal Lower extremities right leg with 3+ femoral and subcutaneous graft pulse the posterior tibial artery. Right foot is well-perfused. Left leg has an AKA which is well-healed with prosthesis in place.  I reviewed her recent studies which reveal a severe stenosis in the distal end of her superficial femoral to posterior tibial vein graft.    Assessment:     Severe stenosis distally and SFA to PT vein graft right leg    Plan:     Plan angiography by Dr. Brabham on Tuesday, April 9 with probable PTA of distal end of vein graft      

## 2012-02-15 ENCOUNTER — Other Ambulatory Visit: Payer: Medicare Other

## 2012-02-20 NOTE — Procedures (Unsigned)
BYPASS GRAFT EVALUATION  INDICATION:  Follow up peripheral vascular disease.  HISTORY: Diabetes:  Yes Cardiac:  CABG Hypertension:  Yes Smoking:  No Previous Surgery:  Right femoral to distal posterior tibial artery bypass graft 08/31/2011.  Left above knee amputation 2006.  SINGLE LEVEL ARTERIAL EXAM                              RIGHT              LEFT Brachial: Anterior tibial: Posterior tibial: Peroneal: Ankle/brachial index:  PREVIOUS ABI:  Date: 07/26/2011  RIGHT:  1.05  LEFT:  AKA  LOWER EXTREMITY BYPASS GRAFT DUPLEX EXAM:  DUPLEX:  Increased velocities (709 cm/sec).  An area of stenosis in the distal femoral to distal posterior tibial artery bypass graft.  The distal anastomosis appears patent.  IMPRESSION: 1. Stenosis in the distal graft with elevated velocities as noted     above. 2. See ABI's on attached study.  ___________________________________________ Quita Skye Hart [place], M.D.  SS/MEDQ  D:  02/08/2012  T:  02/08/2012  Job:  [id number]

## 2012-02-21 ENCOUNTER — Encounter (HOSPITAL_COMMUNITY)
Admission: RE | Disposition: A | Discharge status: Home / Self Care | Payer: Self-pay | Source: Ambulatory Visit | Attending: Surgery

## 2012-02-21 ENCOUNTER — Ambulatory Visit (HOSPITAL_COMMUNITY)
Admission: RE | Admit: 2012-02-21 | Discharge: 2012-02-21 | Disposition: A | Discharge status: Home / Self Care | Payer: Medicare Other | Source: Ambulatory Visit | Attending: Surgery | Admitting: Surgery

## 2012-02-21 DIAGNOSIS — I70219 Atherosclerosis of native arteries of extremities with intermittent claudication, unspecified extremity: Secondary | ICD-10-CM

## 2012-02-21 DIAGNOSIS — E1142 Type 2 diabetes mellitus with diabetic polyneuropathy: Secondary | ICD-10-CM | POA: Insufficient documentation

## 2012-02-21 DIAGNOSIS — I2510 Atherosclerotic heart disease of native coronary artery without angina pectoris: Secondary | ICD-10-CM | POA: Insufficient documentation

## 2012-02-21 DIAGNOSIS — I509 Heart failure, unspecified: Secondary | ICD-10-CM | POA: Insufficient documentation

## 2012-02-21 DIAGNOSIS — I708 Atherosclerosis of other arteries: Secondary | ICD-10-CM | POA: Insufficient documentation

## 2012-02-21 DIAGNOSIS — I129 Hypertensive chronic kidney disease with stage 1 through stage 4 chronic kidney disease, or unspecified chronic kidney disease: Secondary | ICD-10-CM | POA: Insufficient documentation

## 2012-02-21 DIAGNOSIS — E1149 Type 2 diabetes mellitus with other diabetic neurological complication: Secondary | ICD-10-CM | POA: Insufficient documentation

## 2012-02-21 DIAGNOSIS — N189 Chronic kidney disease, unspecified: Secondary | ICD-10-CM | POA: Insufficient documentation

## 2012-02-21 DIAGNOSIS — I70409 Unspecified atherosclerosis of autologous vein bypass graft(s) of the extremities, unspecified extremity: Secondary | ICD-10-CM | POA: Insufficient documentation

## 2012-02-21 HISTORY — PX: LOWER EXTREMITY ANGIOGRAM: SHX5955

## 2012-02-21 HISTORY — PX: LOWER EXTREMITY ANGIOGRAM: SHX5508

## 2012-02-21 LAB — POCT I-STAT, CHEM 8
BUN: 43 mg/dL — ABNORMAL HIGH (ref 6–23)
Calcium, Ion: 1.2 mmol/L (ref 1.12–1.32)
Chloride: 104 mEq/L (ref 96–112)
Creatinine, Ser: 1.7 mg/dL — ABNORMAL HIGH (ref 0.50–1.10)
Glucose, Bld: 213 mg/dL — ABNORMAL HIGH (ref 70–99)
HCT: 40 % (ref 36.0–46.0)
Hemoglobin: 13.6 g/dL (ref 12.0–15.0)
Potassium: 4 mEq/L (ref 3.5–5.1)
Sodium: 137 mEq/L (ref 135–145)
TCO2: 26 mmol/L (ref 0–100)

## 2012-02-21 LAB — POCT ACTIVATED CLOTTING TIME
Activated Clotting Time: 254 seconds
Activated Clotting Time: 209 seconds
Activated Clotting Time: 138 s

## 2012-02-21 SURGERY — ANGIOGRAM, LOWER EXTREMITY
Anesthesia: LOCAL

## 2012-02-21 MED ORDER — HEPARIN SODIUM (PORCINE) 1000 UNIT/ML IJ SOLN
INTRAMUSCULAR | Status: AC
  Filled 2012-02-21: qty 1

## 2012-02-21 MED ORDER — PROTAMINE SULFATE 10 MG/ML IV SOLN
INTRAVENOUS | Status: AC
  Filled 2012-02-21: qty 5

## 2012-02-21 MED ORDER — MIDAZOLAM HCL 2 MG/2ML IJ SOLN
INTRAMUSCULAR | Status: AC
  Filled 2012-02-21: qty 2

## 2012-02-21 MED ORDER — FENTANYL CITRATE 0.05 MG/ML IJ SOLN
INTRAMUSCULAR | Status: AC
  Filled 2012-02-21: qty 2

## 2012-02-21 MED ORDER — HYDRALAZINE HCL 20 MG/ML IJ SOLN
INTRAMUSCULAR | Status: AC
  Filled 2012-02-21: qty 1

## 2012-02-21 MED ORDER — HYDRALAZINE HCL 20 MG/ML IJ SOLN: 10.0000 mg | INTRAMUSCULAR | Status: DC | PRN

## 2012-02-21 MED ORDER — SODIUM CHLORIDE 0.9 % IV SOLN
INTRAVENOUS | Status: DC
  Administered 2012-02-21: 10:00:00 via INTRAVENOUS

## 2012-02-21 MED ORDER — LABETALOL HCL 5 MG/ML IV SOLN
10.0000 mg | INTRAVENOUS | Status: DC | PRN
  Administered 2012-02-21: 10 mg via INTRAVENOUS

## 2012-02-21 MED ORDER — HEPARIN (PORCINE) IN NACL 2-0.9 UNIT/ML-% IJ SOLN
INTRAMUSCULAR | Status: AC
  Filled 2012-02-21: qty 1000

## 2012-02-21 MED ORDER — HYDRALAZINE HCL 20 MG/ML IJ SOLN
INTRAMUSCULAR | Status: AC
Start: 2012-02-21 — End: 2012-02-21
  Filled 2012-02-21: qty 1

## 2012-02-21 MED ORDER — LABETALOL HCL 5 MG/ML IV SOLN
INTRAVENOUS | Status: AC
  Filled 2012-02-21: qty 4

## 2012-02-21 MED ORDER — LIDOCAINE HCL (PF) 1 % IJ SOLN
INTRAMUSCULAR | Status: AC
  Filled 2012-02-21: qty 30

## 2012-02-21 MED ORDER — PROTAMINE SULFATE 10 MG/ML IV SOLN
50.0000 mg | Freq: Once | INTRAVENOUS | Status: AC
  Administered 2012-02-21: 50 mg via INTRAVENOUS

## 2012-02-21 NOTE — Discharge Instructions (Signed)

## 2012-02-21 NOTE — H&P (View-Only) (Signed)
Subjective:     Patient ID: Adrienne Bowman, female   DOB: [birth year], 78 y.o.   MRN: [id number]  HPI this 78 year old female returns for followup regarding her right SFA to posterior tibial saphenous vein graft which I placed in 2002. She denies any symptoms in her right lower extremity such as rest pain nonhealing ulcers infection or cellulitis. She had a routine scan performed last week which revealed a severe stenosis in the distal end of the vein graft. ABI remains adequate at 0.98. There is a velocity of 709 cm/s and the distal end of the vein graft.  Past Medical History  Diagnosis Date  . CHF (congestive heart failure) 2002    acute CHF while getting lower extremilty arteriograms /  Led to CABG  x6  . Hypertension   . PVD (peripheral vascular disease)     extensive PVD / left AKA  . Murmur, heart     soft systolic outflow murmur and a grade 2/6 murmur of the aortic insufficiency  . S/P AKA (above knee amputation)     left AKA with prosthesis  . DIABETES MELLITUS, TYPE II   . NEPHROPATHY, DIABETIC   . Coronary artery disease 2002    CABG x6 / left internal mammary artery graft to the LAD, a saphenous vein graft to the diagonal, a sequential saphenous vein graft to the 1st and 2nd obtuse marginal branches, and a sequential saphenous vein graft the the distal right coronary artery and posterior descending  . Anemia   . CKD (chronic kidney disease)   . Advanced age     History  Substance Use Topics  . Smoking status: Never Smoker   . Smokeless tobacco: Never Used  . Alcohol Use: No    Family History  Problem Relation Age of Onset  . Coronary artery disease      prevalent in sibling  . Heart disease Mother     No Known Allergies  Current outpatient prescriptions:aspirin 81 MG tablet, Take 81 mg by mouth daily.  , Disp: , Rfl: ;  atenolol (TENORMIN) 50 MG tablet, TAKE ONE TABLET BY MOUTH EVERY DAY, Disp: 30 tablet, Rfl: 6;  CADUET 10-10 MG per tablet, TAKE ONE TABLET BY  MOUTH EVERY DAY, Disp: 30 each, Rfl: 4;  cloNIDine (CATAPRES) 0.3 MG tablet, TAKE ONE TABLET BY MOUTH TWICE DAILY, Disp: 60 tablet, Rfl: 6 famotidine (PEPCID) 20 MG tablet, TAKE ONE TABLET BY MOUTH EVERY DAY, Disp: 30 tablet, Rfl: 4;  furosemide (LASIX) 40 MG tablet, Take 1 tablet (40 mg total) by mouth daily., Disp: 30 tablet, Rfl: 11;  hydrALAZINE (APRESOLINE) 100 MG tablet, TAKE ONE TABLET BY MOUTH TWICE DAILY, Disp: 60 tablet, Rfl: 5;  multivitamin (THERAGRAN) per tablet, Take 1 tablet by mouth daily.  , Disp: , Rfl:  potassium chloride SA (K-DUR,KLOR-CON) 20 MEQ tablet, Take 1 tablet (20 mEq total) by mouth daily., Disp: 30 tablet, Rfl: 6  BP 185/62  Pulse 69  Resp 16  Ht 5\' 2"  (1.575 m)  Wt 118 lb 3.2 oz (53.615 kg)  BMI 21.62 kg/m2  SpO2 100%  Body mass index is 21.62 kg/(m^2).          Review of Systems denies chest pain, dyspnea on exertion, PND, orthopnea, chronic bronchitis, hemoptysis.    Objective:   Physical Exam pressure 185/62 heart rate 69 respirations 16 General elderly female no apparent stress alert and oriented x3 Chest no rhonchi or wheezing Cardiovascular regular rhythm no murmurs carotid pulses 3+ no  audible bruits Neurologic normal Lower extremities right leg with 3+ femoral and subcutaneous graft pulse the posterior tibial artery. Right foot is well-perfused. Left leg has an AKA which is well-healed with prosthesis in place.  I reviewed her recent studies which reveal a severe stenosis in the distal end of her superficial femoral to posterior tibial vein graft.    Assessment:     Severe stenosis distally and SFA to PT vein graft right leg    Plan:     Plan angiography by Dr. Myra Gianotti on Tuesday, April 9 with probable PTA of distal end of vein graft

## 2012-02-21 NOTE — Interval H&P Note (Signed)
History and Physical Interval Note:  02/21/2012 7:20 AM  Adrienne Bowman  has presented today for surgery, with the diagnosis of pvd  The various methods of treatment have been discussed with the patient and family. After consideration of risks, benefits and other options for treatment, the patient has consented to  Procedure(s) (LRB): LOWER EXTREMITY ANGIOGRAM (N/A) as a surgical intervention .  The patients' history has been reviewed, patient examined, no change in status, stable for surgery.  I have reviewed the patients' chart and labs.  Questions were answered to the patient's satisfaction.     Elaf Clauson IV, V. WELLS

## 2012-02-21 NOTE — Op Note (Signed)
[facility]  Patient name: Adrienne Bowman MRN: [id number] DOB: 18-Jan-1918 Sex: female  02/21/2012 Pre-operative Diagnosis: Bypass graft stenosis Post-operative diagnosis:  Same Surgeon:  Jorge Ny Procedure Performed:  1.  ultrasound access left femoral artery  2.  abdominal aortogram  3.  right lower extremity runoff  4.  additional order catheterization  5.  angioplasty right femoral posterior tibial bypass graft    Indications:  The patient has a history of a right superficial femoral to posterior tibial vein bypass graft in 2002. By ultrasound she has developed a high-grade distal anastomotic stenosis she comes in today for further evaluation and possible intervention  Procedure:  The patient was identified in the holding area and taken to room 8.  The patient was then placed supine on the table and prepped and draped in the usual sterile fashion.  A time out was called.  Ultrasound was used to evaluate the left common femoral artery.  It was patent .  A digital ultrasound image was acquired.  A micropuncture needle was used to access the left common femoral artery under ultrasound guidance.  An 018 wire was advanced without resistance and a micropuncture sheath was placed.  The 018 wire was removed and a benson wire was placed.  The micropuncture sheath was exchanged for a 5 french sheath.  An omniflush catheter was advanced over the wire to the level of L-1.  An abdominal angiogram was obtained.  Next, using the omniflush catheter and a benson wire, the aortic bifurcation was crossed and the catheter was placed into theright external iliac artery and right runoff was obtained.   Findings:   Aortogram:  The visualized portions of the suprarenal abdominal aorta showed no significant disease. No renal artery stenosis. The infrarenal abdominal aorta is heavily calcified but patent without stenosis. The left common and external iliac arteries are widely  patent. There is ostial stenosis at the origin of the right common iliac artery, approximately 50%. The right external iliac artery is widely patent.  Right Lower Extremity:  The right common femoral artery is patent throughout its course. The right profunda femoral artery is widely patent. And bypass graft is visualized originating from the proximal superficial femoral artery. There is approximately 40% stenosis within the native superficial femoral artery. The vein bypass graft is patent all the way down to his distal anastomosis. Just proximal to the distal anastomosis, there is a high-grade, 90% stenosis likely had a valve. The distal posterior tibial artery. The distal to anterior tibial artery is occluded. The bypass graft is being Patent by retrograde filling of the peroneal artery    Intervention:  After the above images were obtained the decision was made to proceed. Over a Teena Dunk wire a long 5 [place] Ansel 1 sheath was placed. Using a 014 sparta core wire with the support of a 3 x 2 Fox SV balloon, the bypass graft was selected. The balloon and wire were advanced together down to just above where the lesion was. I removed the wire and performed additional diagnostic images with the catheter in the bypass graft below the knee. The patient was fully heparinized. I then used the wire and balloon across the lesion. Primary balloon and plasty was performed within the vein bypass graft just proximal to its distal anastomosis. The balloon was taken to 12 atmospheres and held for 1 minute. Completion angiogram revealed resolution of stenosis within the vein bypass graft. At this point, decision was made  to terminate the procedure. Catheters and wires were removed. The sheath was withdrawn to the left iliac system. The patient will be taken to the holding area for sheath pull once her coagulation profile corrects.  Impression:  #1  proximal right common iliac stenosis approximately 50-60%  #2  high grade,  90% bypass graft stenosis, within the distal portion of the vein conduit, proximal to the distal anastomosis  #3  successful angioplasty of vein bypass graft stenosis using a 3 x 2 balloon    V. Durene Cal, M.D. [facility] Office: [phone] Pager:  [phone]

## 2012-02-22 ENCOUNTER — Other Ambulatory Visit: Payer: Self-pay | Admitting: *Deleted

## 2012-02-22 DIAGNOSIS — Z48812 Encounter for surgical aftercare following surgery on the circulatory system: Secondary | ICD-10-CM

## 2012-02-22 DIAGNOSIS — I70219 Atherosclerosis of native arteries of extremities with intermittent claudication, unspecified extremity: Secondary | ICD-10-CM

## 2012-03-10 ENCOUNTER — Other Ambulatory Visit: Payer: Self-pay | Admitting: Nurse Practitioner

## 2012-03-12 NOTE — Telephone Encounter (Signed)
Refilled caduet 

## 2012-03-16 ENCOUNTER — Encounter: Payer: Medicare Other | Admitting: Internal Medicine

## 2012-03-26 ENCOUNTER — Encounter: Payer: Self-pay | Admitting: Nurse Practitioner

## 2012-04-10 ENCOUNTER — Other Ambulatory Visit: Payer: Self-pay | Admitting: Nurse Practitioner

## 2012-04-17 ENCOUNTER — Encounter (HOSPITAL_COMMUNITY): Payer: Self-pay | Admitting: *Deleted

## 2012-04-17 ENCOUNTER — Emergency Department (HOSPITAL_COMMUNITY)
Admission: EM | Admit: 2012-04-17 | Discharge: 2012-04-17 | Disposition: A | Discharge status: Home / Self Care | Payer: Medicare Other | Attending: Emergency Medicine | Admitting: Emergency Medicine

## 2012-04-17 ENCOUNTER — Emergency Department (HOSPITAL_COMMUNITY): Payer: Medicare Other

## 2012-04-17 DIAGNOSIS — R51 Headache: Secondary | ICD-10-CM | POA: Insufficient documentation

## 2012-04-17 DIAGNOSIS — E119 Type 2 diabetes mellitus without complications: Secondary | ICD-10-CM | POA: Insufficient documentation

## 2012-04-17 DIAGNOSIS — S78119A Complete traumatic amputation at level between unspecified hip and knee, initial encounter: Secondary | ICD-10-CM | POA: Insufficient documentation

## 2012-04-17 DIAGNOSIS — I25810 Atherosclerosis of coronary artery bypass graft(s) without angina pectoris: Secondary | ICD-10-CM | POA: Insufficient documentation

## 2012-04-17 DIAGNOSIS — I10 Essential (primary) hypertension: Secondary | ICD-10-CM | POA: Insufficient documentation

## 2012-04-17 DIAGNOSIS — Z79899 Other long term (current) drug therapy: Secondary | ICD-10-CM | POA: Insufficient documentation

## 2012-04-17 LAB — CBC
HCT: 35.6 % — ABNORMAL LOW (ref 36.0–46.0)
MCV: 79.6 fL (ref 78.0–100.0)
Platelets: 249 10*3/uL (ref 150–400)
RBC: 4.47 MIL/uL (ref 3.87–5.11)
WBC: 4.9 10*3/uL (ref 4.0–10.5)

## 2012-04-17 LAB — BASIC METABOLIC PANEL
BUN: 38 mg/dL — ABNORMAL HIGH (ref 6–23)
CO2: 26 mEq/L (ref 19–32)
Chloride: 84 mEq/L — ABNORMAL LOW (ref 96–112)
Creatinine, Ser: 1.67 mg/dL — ABNORMAL HIGH (ref 0.50–1.10)

## 2012-04-17 MED ORDER — OXYCODONE-ACETAMINOPHEN 5-325 MG PO TABS
1.0000 | ORAL_TABLET | Freq: Once | ORAL | Status: AC
  Administered 2012-04-17: 1 via ORAL
  Filled 2012-04-17: qty 1

## 2012-04-17 MED ORDER — HYDRALAZINE HCL 50 MG PO TABS
100.0000 mg | ORAL_TABLET | Freq: Once | ORAL | Status: AC
  Administered 2012-04-17: 100 mg via ORAL
  Filled 2012-04-17 (×2): qty 2

## 2012-04-17 MED ORDER — CLONIDINE HCL 0.1 MG PO TABS
0.3000 mg | ORAL_TABLET | Freq: Once | ORAL | Status: AC
  Administered 2012-04-17: 0.3 mg via ORAL
  Filled 2012-04-17: qty 3

## 2012-04-17 MED ORDER — ATENOLOL 50 MG PO TABS
50.0000 mg | ORAL_TABLET | Freq: Once | ORAL | Status: AC
  Administered 2012-04-17: 50 mg via ORAL
  Filled 2012-04-17: qty 1

## 2012-04-17 MED ORDER — HYDRALAZINE HCL 20 MG/ML IJ SOLN
10.0000 mg | Freq: Once | INTRAMUSCULAR | Status: DC
  Filled 2012-04-17: qty 0.5

## 2012-04-17 NOTE — ED Provider Notes (Signed)
History     CSN: [id number]  Arrival date & time 04/17/12  1044   First MD Initiated Contact with Patient 04/17/12 1146      Chief Complaint  Patient presents with  . Headache  . Hypertension    (Consider location/radiation/quality/duration/timing/severity/associated sxs/prior treatment) HPI  89yoF h/o HTN, DM pw head pain. Mainly the past 2 weeks she's experienced throbbing pain in the frontal bilateral parietal regions of her head. She states she also feels "like cobwebs on my face". She states "it's not a pain really" however she states that her head was hurting so that last night he has had difficulty sleeping. She denies change in vision. She denies difficulty swallowing or changes in her speech. She denies numbness, tingling, weakness of her extremities or her face. She has remained ambulatory. She has not taken anything for pain at home. She states that she's been compliant with her medications but that "my blood pressure is always that high".   ED Notes, ED Provider Notes from 04/17/12 0000 to 04/17/12 10:57:50       Melissa Loyal Gambler, RN 04/17/2012 10:57      Pt reports intermittent headaches x 2 weeks and facial numbness (states feels like a cobweb is over my face). Pt states last night started having a boiling feeling to head and it has not went away. Stroke scale negative. Does take antihypertensive meds.     Past Medical History  Diagnosis Date  . CHF (congestive heart failure) 2002    acute CHF while getting lower extremilty arteriograms /  Led to CABG  x6  . Hypertension   . PVD (peripheral vascular disease)     extensive PVD / left AKA  . Murmur, heart     soft systolic outflow murmur and a grade 2/6 murmur of the aortic insufficiency  . S/P AKA (above knee amputation)     left AKA with prosthesis  . DIABETES MELLITUS, TYPE II   . NEPHROPATHY, DIABETIC   . Coronary artery disease 2002    CABG x6 / left internal mammary artery graft to the LAD, a saphenous  vein graft to the diagonal, a sequential saphenous vein graft to the 1st and 2nd obtuse marginal branches, and a sequential saphenous vein graft the the distal right coronary artery and posterior descending  . Anemia   . CKD (chronic kidney disease)   . Advanced age     Past Surgical History  Procedure Date  . Coronary artery bypass graft 2002    x6 per Dr. Laneta Simmers  . Eye surgery 1987 & 1988     left eye  . Vascular surgery     multiple peripheral vascular surgeries  . Left aka 2006  . Right renal artery stent 2004  . Rfpbpg 2002    Family History  Problem Relation Age of Onset  . Coronary artery disease      prevalent in sibling  . Heart disease Mother     History  Substance Use Topics  . Smoking status: Never Smoker   . Smokeless tobacco: Never Used  . Alcohol Use: No    OB History    Grav Para Term Preterm Abortions TAB SAB Ect Mult Living                  Review of Systems  All other systems reviewed and are negative.   except as noted HPI   Allergies  Review of patient's allergies indicates no known allergies.  Home Medications  Current Outpatient Rx  Name Route Sig Dispense Refill  . AMLODIPINE-ATORVASTATIN 10-10 MG PO TABS Oral Take 1 tablet by mouth daily.    . ASPIRIN 81 MG PO TABS Oral Take 81 mg by mouth daily.      . ATENOLOL 50 MG PO TABS Oral Take 50 mg by mouth daily.    [name] CLONIDINE HCL 0.3 MG PO TABS Oral Take 0.3 mg by mouth 2 (two) times daily.    [name] FAMOTIDINE 20 MG PO TABS Oral Take 20 mg by mouth 2 (two) times daily.    . FUROSEMIDE 40 MG PO TABS Oral Take 40 mg by mouth daily.    [name] HYDRALAZINE HCL 100 MG PO TABS Oral Take 100 mg by mouth 2 (two) times daily.    . MULTIVITAMINS PO TABS Oral Take 1 tablet by mouth daily.        BP 185/49  Pulse 60  Temp(Src) 97.6 F (36.4 C) (Oral)  Resp 18  SpO2 100%  Physical Exam  Nursing note and vitals reviewed. Constitutional: She is oriented to person, place, and time. She appears  well-developed.  HENT:  Head: Atraumatic.  Mouth/Throat: Oropharynx is clear and moist.       Tm wnl b/l No TA ttp  Eyes: Conjunctivae and EOM are normal. Pupils are equal, round, and reactive to light.  Neck: Normal range of motion. Neck supple.  Cardiovascular: Normal rate, regular rhythm, normal heart sounds and intact distal pulses.   Pulmonary/Chest: Effort normal and breath sounds normal. No respiratory distress. She has no wheezes. She has no rales.  Abdominal: Soft. She exhibits no distension. There is no tenderness. There is no rebound and no guarding.  Musculoskeletal: Normal range of motion.  Neurological: She is alert and oriented to person, place, and time. No cranial nerve deficit. She exhibits normal muscle tone. Coordination normal.       Strength 5/5 all extremities No pronator drift No facial droop   Skin: Skin is warm and dry. No rash noted.  Psychiatric: She has a normal mood and affect.     Date: 04/17/2012  Rate: 63  Rhythm: normal sinus rhythm  QRS Axis: normal  Intervals: indet  ST/T Wave abnormalities: nonspecific T wave changes  Conduction Disutrbances:nonspecific intraventricular conduction delay  Narrative Interpretation:   Old EKG Reviewed: changes noted wavy baseline with baseline artifact   ED Course  Procedures (including critical care time)  Labs Reviewed  CBC - Abnormal; Notable for the following:    HCT 35.6 (*)    All other components within normal limits  BASIC METABOLIC PANEL - Abnormal; Notable for the following:    Sodium 124 (*)    Potassium 3.2 (*)    Chloride 84 (*)    Glucose, Bld 170 (*)    BUN 38 (*)    Creatinine, Ser 1.67 (*)    GFR calc non Af Amer 26 (*)    GFR calc Af Amer 30 (*)    All other components within normal limits   Ct Head Wo Contrast  04/17/2012  *RADIOLOGY REPORT*  Clinical Data: Headache, hypertension  CT HEAD WITHOUT CONTRAST  Technique:  Contiguous axial images were obtained from the base of the skull  through the vertex without contrast.  Comparison: None.  Findings: Diffuse brain atrophy evident with periventricular white matter microvascular ischemic changes.  No acute intracranial hemorrhage, definite mass lesion, infarction, midline shift, herniation, hydrocephalus, or extra-axial fluid collection. Cisterns patent.  Cerebellar atrophy as well.  Symmetric orbits. Atherosclerosis of the intracranial vessels at the skull base. Mastoids and sinuses clear.  IMPRESSION: Brain atrophy and microvascular ischemic changes.  No acute finding by noncontrast CT.  Original Report Authenticated By: Judie Petit. Ruel Favors, M.D.     1. Headache   2. Hypertension      MDM  PW vague sx of head pounding, resolved in ED s/p percocet. She also has feelings of "like cobwebbs on both sides of my face". No si/sx of acute stroke. Appears well. BP elevated, per patient to baseline. Cr baseline. Will give dose of her antihypertensives orally in ED. Pt states she's ready for dc home.         Forbes Cellar, MD 04/17/12 1525

## 2012-04-17 NOTE — Discharge Instructions (Signed)
Arterial Hypertension Arterial hypertension (high blood pressure) is a condition of elevated pressure in your blood vessels. Hypertension over a long period of time is a risk factor for strokes, heart attacks, and heart failure. It is also the leading cause of kidney (renal) failure.  CAUSES   In Adults -- Over 90% of all hypertension has no known cause. This is called essential or primary hypertension. In the other 10% of people with hypertension, the increase in blood pressure is caused by another disorder. This is called secondary hypertension. Important causes of secondary hypertension are:   Heavy alcohol use.   Obstructive sleep apnea.   Hyperaldosterosim (Conn's syndrome).   Steroid use.   Chronic kidney failure.   Hyperparathyroidism.   Medications.   Renal artery stenosis.   Pheochromocytoma.   Cushing's disease.   Coarctation of the aorta.   Scleroderma renal crisis.   Licorice (in excessive amounts).   Drugs (cocaine, methamphetamine).  Your caregiver can explain any items above that apply to you.  In Children -- Secondary hypertension is more common and should always be considered.   Pregnancy -- Few women of childbearing age have high blood pressure. However, up to 10% of them develop hypertension of pregnancy. Generally, this will not harm the woman. It may be a sign of 3 complications of pregnancy: preeclampsia, HELLP syndrome, and eclampsia. Follow up and control with medication is necessary.  SYMPTOMS   This condition normally does not produce any noticeable symptoms. It is usually found during a routine exam.   Malignant hypertension is a late problem of high blood pressure. It may have the following symptoms:   Headaches.   Blurred vision.   End-organ damage (this means your kidneys, heart, lungs, and other organs are being damaged).   Stressful situations can increase the blood pressure. If a person with normal blood pressure has their blood  pressure go up while being seen by their caregiver, this is often termed "white coat hypertension." Its importance is not known. It may be related with eventually developing hypertension or complications of hypertension.   Hypertension is often confused with mental tension, stress, and anxiety.  DIAGNOSIS  The diagnosis is made by 3 separate blood pressure measurements. They are taken at least 1 week apart from each other. If there is organ damage from hypertension, the diagnosis may be made without repeat measurements. Hypertension is usually identified by having blood pressure readings:  Above 140/90 mmHg measured in both arms, at 3 separate times, over a couple weeks.   Over 130/80 mmHg should be considered a risk factor and may require treatment in patients with diabetes.  Blood pressure readings over 120/80 mmHg are called "pre-hypertension" even in non-diabetic patients. To get a true blood pressure measurement, use the following guidelines. Be aware of the factors that can alter blood pressure readings.  Take measurements at least 1 hour after caffeine.   Take measurements 30 minutes after smoking and without any stress. This is another reason to quit smoking - it raises your blood pressure.   Use a proper cuff size. Ask your caregiver if you are not sure about your cuff size.   Most home blood pressure cuffs are automatic. They will measure systolic and diastolic pressures. The systolic pressure is the pressure reading at the start of sounds. Diastolic pressure is the pressure at which the sounds disappear. If you are elderly, measure pressures in multiple postures. Try sitting, lying or standing.   Sit at rest for a minimum of   5 minutes before taking measurements.   You should not be on any medications like decongestants. These are found in many cold medications.   Record your blood pressure readings and review them with your caregiver.  If you have hypertension:  Your caregiver  may do tests to be sure you do not have secondary hypertension (see "causes" above).   Your caregiver may also look for signs of metabolic syndrome. This is also called Syndrome X or Insulin Resistance Syndrome. You may have this syndrome if you have type 2 diabetes, abdominal obesity, and abnormal blood lipids in addition to hypertension.   Your caregiver will take your medical and family history and perform a physical exam.   Diagnostic tests may include blood tests (for glucose, cholesterol, potassium, and kidney function), a urinalysis, or an EKG. Other tests may also be necessary depending on your condition.  PREVENTION  There are important lifestyle issues that you can adopt to reduce your chance of developing hypertension:  Maintain a normal weight.   Limit the amount of salt (sodium) in your diet.   Exercise often.   Limit alcohol intake.   Get enough potassium in your diet. Discuss specific advice with your caregiver.   Follow a DASH diet (dietary approaches to stop hypertension). This diet is rich in fruits, vegetables, and low-fat dairy products, and avoids certain fats.  PROGNOSIS  Essential hypertension cannot be cured. Lifestyle changes and medical treatment can lower blood pressure and reduce complications. The prognosis of secondary hypertension depends on the underlying cause. Many people whose hypertension is controlled with medicine or lifestyle changes can live a normal, healthy life.  RISKS AND COMPLICATIONS  While high blood pressure alone is not an illness, it often requires treatment due to its short- and long-term effects on many organs. Hypertension increases your risk for:  CVAs or strokes (cerebrovascular accident).   Heart failure due to chronically high blood pressure (hypertensive cardiomyopathy).   Heart attack (myocardial infarction).   Damage to the retina (hypertensive retinopathy).   Kidney failure (hypertensive nephropathy).  Your caregiver can  explain list items above that apply to you. Treatment of hypertension can significantly reduce the risk of complications. TREATMENT   For overweight patients, weight loss and regular exercise are recommended. Physical fitness lowers blood pressure.   Mild hypertension is usually treated with diet and exercise. A diet rich in fruits and vegetables, fat-free dairy products, and foods low in fat and salt (sodium) can help lower blood pressure. Decreasing salt intake decreases blood pressure in a 1/3 of people.   Stop smoking if you are a smoker.  The steps above are highly effective in reducing blood pressure. While these actions are easy to suggest, they are difficult to achieve. Most patients with moderate or severe hypertension end up requiring medications to bring their blood pressure down to a normal level. There are several classes of medications for treatment. Blood pressure pills (antihypertensives) will lower blood pressure by their different actions. Lowering the blood pressure by 10 mmHg may decrease the risk of complications by as much as 25%. The goal of treatment is effective blood pressure control. This will reduce your risk for complications. Your caregiver will help you determine the best treatment for you according to your lifestyle. What is excellent treatment for one person, may not be for you. HOME CARE INSTRUCTIONS   Do not smoke.   Follow the lifestyle changes outlined in the "Prevention" section.   If you are on medications, follow the directions   carefully. Blood pressure medications must be taken as prescribed. Skipping doses reduces their benefit. It also puts you at risk for problems.   Follow up with your caregiver, as directed.   If you are asked to monitor your blood pressure at home, follow the guidelines in the "Diagnosis" section above.  SEEK MEDICAL CARE IF:   You think you are having medication side effects.   You have recurrent headaches or lightheadedness.     You have swelling in your ankles.   You have trouble with your vision.  SEEK IMMEDIATE MEDICAL CARE IF:   You have sudden onset of chest pain or pressure, difficulty breathing, or other symptoms of a heart attack.   You have a severe headache.   You have symptoms of a stroke (such as sudden weakness, difficulty speaking, difficulty walking).  MAKE SURE YOU:   Understand these instructions.   Will watch your condition.   Will get help right away if you are not doing well or get worse.  Document Released: 10/31/2005 Document Revised: 10/20/2011 Document Reviewed: 05/31/2007 Exit[employer] Patient Information 2012 [employer]  RESOURCE GUIDE  Dental Problems  Patients with Medicaid: [facility]                     [facility] [address]                                           [address] Phone:  [phone]                                                   Phone:  [phone]  If unable to pay or uninsured, contact:  [facility] or [facility] to become qualified for the adult dental clinic.  Chronic Pain Problems Contact [facility]  [phone] Patients need to be referred by their primary care doctor.  Insufficient Money for Medicine Contact [employer]:  call "211" or [facility] 271-5999.  No Primary Care Doctor Call [facility]  832-8000 Other agencies that provide inexpensive medical care    [facility]  [phone]    [facility]  [phone]    [facility]  [phone]    [facility] Clinic  [phone]    [facility]  [phone]    [facility]  [phone]  Psychological Services [facility]  [phone] [facility]  [phone] [facility]   800 853-5163 (emergency services 641-4993)  Abuse/Neglect [place] Child Abuse Hotline (336) 641-3795 [place] [facility] Abuse Hotline 800-378-5315 (After  Hours)  Emergency Shelter [facility] (336) 271-5985  [facility] (336) 275-9566 [facility] (704) 372-4663  MRSA Hotline #:   832-7006    [place] [facility]  [facility] [place]  [employer]                           [facility] [address] [place]                       [address]         [address]  [place]                                               [place]                              [place] Phone:  [phone]                                  Phone:  [phone]                   Phone:  [phone]  [facility] Phone:  [phone]  [place] Child Abuse Hotline (336) 342-1394 (336) 342-3537 (After Hours)  

## 2012-04-17 NOTE — ED Notes (Signed)
Pt reports intermittent headaches x 2 weeks and facial numbness (states feels like a cobweb is over my face). Pt states last night started having a boiling feeling to head and it has not went away. Stroke scale negative. Does take antihypertensive meds.

## 2012-04-17 NOTE — ED Notes (Signed)
Denies blurred vision.

## 2012-04-17 NOTE — ED Notes (Signed)
Pt undressed, in gown, on monitor, continuous pulse oximetry and blood pressure cuff; EKG performed; family at bedside 

## 2012-04-20 ENCOUNTER — Encounter: Payer: Self-pay | Admitting: Family Medicine

## 2012-04-20 ENCOUNTER — Ambulatory Visit (INDEPENDENT_AMBULATORY_CARE_PROVIDER_SITE_OTHER): Payer: Medicare Other | Admitting: Family Medicine

## 2012-04-20 ENCOUNTER — Encounter: Payer: Self-pay | Admitting: Cardiology

## 2012-04-20 VITALS — BP 190/50 | HR 56 | Temp 97.5°F | Wt 113.0 lb

## 2012-04-20 DIAGNOSIS — R519 Headache, unspecified: Secondary | ICD-10-CM | POA: Insufficient documentation

## 2012-04-20 DIAGNOSIS — I10 Essential (primary) hypertension: Secondary | ICD-10-CM

## 2012-04-20 DIAGNOSIS — R51 Headache: Secondary | ICD-10-CM | POA: Insufficient documentation

## 2012-04-20 MED ORDER — CLONIDINE HCL 0.1 MG PO TABS
0.1000 mg | ORAL_TABLET | Freq: Once | ORAL | Status: AC
  Administered 2012-04-20: 0.1 mg via ORAL

## 2012-04-20 MED ORDER — RAMIPRIL 10 MG PO CAPS: 10.0000 mg | ORAL_CAPSULE | Freq: Every day | ORAL | Status: DC

## 2012-04-20 NOTE — Progress Notes (Signed)
   Dr. Dayton Martes Call today to say that the patient's blood pressure is still elevated. She restarted the patient's Altace. She asked that we arrange for followup soon. I will see if this can be added to Dr. Jenene Slicker schedule in the near future.

## 2012-04-20 NOTE — Progress Notes (Signed)
History of Present Illness: Adrienne Bowman is is a very pleasant 78 yo female with known h/o CAD, prior CABG, HTN, DM, PVD with left AKA, here for ER follow up.  Went to the ER on 04/18/2012 for bilateral parietal headache. Notes reviewed.  Head CT and EKG unremarkable.  BP was very elevated- sister took her when BP at home was 209/114.  Has h/o poorly controlled BP but she is typically not symptomatic.  She is followed by cardiology, Dr.  Antoine Poche and Lawson Fiscal. BP remains extremely elevated today and she reports that she has been taking her blood pressure medication as directed.  Atenolol 50 mg daily, Clonidine 0.3 mg twice daily, hydralazine 100 mg twice daily, Caduet and Lasix.  Meds have been adjusted multiple times- notes reviewed from cardiology.  BP Readings from Last 3 Encounters:  04/17/12 196/62  02/21/12 172/128  02/21/12 172/128        Current Outpatient Prescriptions on File Prior to Visit  Medication Sig Dispense Refill  . amlodipine-atorvastatin (CADUET) 10-10 MG per tablet Take 1 tablet by mouth daily.      [name] aspirin 81 MG tablet Take 81 mg by mouth daily.        [name] atenolol (TENORMIN) 50 MG tablet Take 50 mg by mouth daily.      . cloNIDine (CATAPRES) 0.3 MG tablet Take 0.3 mg by mouth 2 (two) times daily.      . famotidine (PEPCID) 20 MG tablet Take 20 mg by mouth 2 (two) times daily.      . furosemide (LASIX) 40 MG tablet Take 40 mg by mouth daily.      . hydrALAZINE (APRESOLINE) 100 MG tablet Take 100 mg by mouth 2 (two) times daily.      . multivitamin (THERAGRAN) per tablet Take 1 tablet by mouth daily.          No Known Allergies  Past Medical History  Diagnosis Date  . CHF (congestive heart failure) 2002    acute CHF while getting lower extremilty arteriograms /  Led to CABG  x6  . Hypertension   . PVD (peripheral vascular disease)     extensive PVD / left AKA  . Murmur, heart     soft systolic outflow murmur and a grade 2/6 murmur of the aortic insufficiency    . S/P AKA (above knee amputation)     left AKA with prosthesis  . DIABETES MELLITUS, TYPE II   . NEPHROPATHY, DIABETIC   . Coronary artery disease 2002    CABG x6 / left internal mammary artery graft to the LAD, a saphenous vein graft to the diagonal, a sequential saphenous vein graft to the 1st and 2nd obtuse marginal branches, and a sequential saphenous vein graft the the distal right coronary artery and posterior descending  . Anemia   . CKD (chronic kidney disease)   . Advanced age     Past Surgical History  Procedure Date  . Coronary artery bypass graft 2002    x6 per Dr. Laneta Simmers  . Eye surgery 1987 & 1988     left eye  . Vascular surgery     multiple peripheral vascular surgeries  . Left aka 2006  . Right renal artery stent 2004  . Rfpbpg 2002    History  Smoking status  . Never Smoker   Smokeless tobacco  . Never Used    History  Alcohol Use No    Family History  Problem Relation Age of Onset  . Coronary  artery disease      prevalent in sibling  . Heart disease Mother     Review of Systems: Still has mild headache The review of systems is per the HPI.  She tries to stay active. All other systems were reviewed and are negative.  Physical Exam: BP 190/50  Pulse 56  Temp 97.5 F (36.4 C)  Wt 113 lb (51.256 kg)  Patient is very pleasant and in no acute distress. Looks younger than her stated age. Remains quite upbeat with her attitude. Skin is warm and dry. Color is normal.  HEENT is unremarkable. Normocephalic/atraumatic. PERRL. Sclera are nonicteric. Neck is supple. No masses. No JVD. Lungs are clear. Cardiac exam shows a regular rate and rhythm. Abdomen is soft. Extremities are without edema. Gait and ROM are intact. Left AKA noted. No gross neurologic deficits noted.  Assessment / Plan:  1. HYPERTENSION  Deteriorated- very poorly controlled and now symptomatic.  Given catapress 0.1 mg in office- BP trended down only to 180/50.  HA has improved.  I  restarted her Altace 10 mg daily and advised her to follow up with cardiology in the next 1-2 weeks.  Spoke with Dr. Myrtis Ser as well who was made aware of the above. cloNIDine (CATAPRES) tablet 0.1 mg  2. Headache  Improved- likely secondary to fluctuating and very elevated blood pressures.

## 2012-04-20 NOTE — Patient Instructions (Signed)
Good to see you,  Ms. Garms. I restarted your Altace (I sent the prescription to your pharmacy). Please make an appointment to see your heart doctor in the next 1-2 weeks.

## 2012-04-26 ENCOUNTER — Encounter: Payer: Self-pay | Admitting: Nurse Practitioner

## 2012-04-26 ENCOUNTER — Ambulatory Visit (INDEPENDENT_AMBULATORY_CARE_PROVIDER_SITE_OTHER): Payer: Medicare Other | Admitting: Nurse Practitioner

## 2012-04-26 VITALS — BP 170/40 | HR 66 | Ht 64.0 in | Wt 113.0 lb

## 2012-04-26 DIAGNOSIS — I2510 Atherosclerotic heart disease of native coronary artery without angina pectoris: Secondary | ICD-10-CM

## 2012-04-26 DIAGNOSIS — I739 Peripheral vascular disease, unspecified: Secondary | ICD-10-CM

## 2012-04-26 DIAGNOSIS — N189 Chronic kidney disease, unspecified: Secondary | ICD-10-CM

## 2012-04-26 DIAGNOSIS — I10 Essential (primary) hypertension: Secondary | ICD-10-CM

## 2012-04-26 LAB — BASIC METABOLIC PANEL
BUN: 32 mg/dL — ABNORMAL HIGH (ref 6–23)
CO2: 30 mEq/L (ref 19–32)
Calcium: 9 mg/dL (ref 8.4–10.5)
Chloride: 88 mEq/L — ABNORMAL LOW (ref 96–112)
Creatinine, Ser: 1.6 mg/dL — ABNORMAL HIGH (ref 0.4–1.2)
GFR: 38.25 mL/min — ABNORMAL LOW (ref >60.00)
Glucose, Bld: 133 mg/dL — ABNORMAL HIGH (ref 70–99)
Potassium: 3.9 mEq/L (ref 3.5–5.1)
Sodium: 125 mEq/L — ABNORMAL LOW (ref 135–145)

## 2012-04-26 MED ORDER — HYDRALAZINE HCL 100 MG PO TABS: 100.0000 mg | ORAL_TABLET | Freq: Three times a day (TID) | ORAL | Status: DC

## 2012-04-26 NOTE — Assessment & Plan Note (Signed)
No chest pain reported 

## 2012-04-26 NOTE — Patient Instructions (Addendum)
You do not have to take the Ramipril  Increase your Hydralazine to three times a day. I have sent a new prescription to the drug store  We are going to recheck your potassium level today  I will see you in about 7 to 10 days. Take your medicines on the day I see you back  Try to check some blood pressures at home  Call the [facility] office at [phone] if you have any questions, problems or concerns.

## 2012-04-26 NOTE — Assessment & Plan Note (Signed)
Blood pressure continues to be an issue. She has had no medicines today. She does not want to take the Altace. I have increased the Hydralazine to TID. I will see her back in about 7 to 10 days. Recheck a BMET today as well. I have asked her that when she comes back to take her medicines prior to the visit. Patient is agreeable to this plan and will call if any problems develop in the interim.

## 2012-04-26 NOTE — Assessment & Plan Note (Signed)
She may be seeing Renal next week. She does have renal insuffiencey. No evidence of renal artery stenosis on her recent arteriogram.

## 2012-04-26 NOTE — Assessment & Plan Note (Signed)
She has had recent PCI per Dr. Myra Gianotti and has follow up in July with Dr. Hart [place].

## 2012-04-26 NOTE — Progress Notes (Signed)
Adrienne Bowman Date of Birth: 02-12-1918 Medical Record #[id number]  History of Present Illness: Adrienne Bowman is seen back today for an early follow up visit. She is seen for Dr. Antoine Poche. She has known CAD with prior CABG, HTN, Dm, PVD with left AKA and recent angioplasty of the right femoral posterior tibial bypass graft per Dr. Myra Gianotti. She has had renal failure with ACE and aldactone in the past. She has never wanted much done for her and does not like to take her medicines. Compliance has been an issue since I have been caring for her over the past 10 to 12 years.   She comes in today. She is here with her family. She has had a recent visit to the ER for a headache. Blood pressure was grossly elevated. Now back on some ACE. She does not have renal artery stenosis per recent angiogram noted. She says she feels bad. She is tired. Head still hurts. No cough or cold symptoms. Has had none of her medicines today since she has not had breakfast. She tells me that she only took the Altace twice and thought it made her feel worse, so she has not been taking it since. No chest pain. Not short of breath. Not using salt. Not as "peppy" as she usually is. She has had recent labs which show some renal insufficiency and low potassium. She thinks she is seeing Dr. [name] next week but she is not sure.   Current Outpatient Prescriptions on File Prior to Visit  Medication Sig Dispense Refill  . amlodipine-atorvastatin (CADUET) 10-10 MG per tablet Take 1 tablet by mouth daily.      [name] aspirin 81 MG tablet Take 81 mg by mouth daily.        [name] atenolol (TENORMIN) 50 MG tablet Take 50 mg by mouth daily.      . cloNIDine (CATAPRES) 0.3 MG tablet Take 0.3 mg by mouth 2 (two) times daily.      . famotidine (PEPCID) 20 MG tablet Take 20 mg by mouth 2 (two) times daily.      . furosemide (LASIX) 40 MG tablet Take 40 mg by mouth daily.      . multivitamin (THERAGRAN) per tablet Take 1 tablet by mouth daily.        . potassium  chloride SA (KLOR-CON M20) 20 MEQ tablet Take 20 mEq by mouth daily.        No Known Allergies  Past Medical History  Diagnosis Date  . CHF (congestive heart failure) 2002    acute CHF while getting lower extremilty arteriograms /  Led to CABG  x6  . Hypertension     very difficult to control  . PVD (peripheral vascular disease)     extensive PVD / left AKA  . Murmur, heart     soft systolic outflow murmur and a grade 2/6 murmur of the aortic insufficiency  . S/P AKA (above knee amputation)     left AKA with prosthesis  . DIABETES MELLITUS, TYPE II   . NEPHROPATHY, DIABETIC   . Coronary artery disease 2002    CABG x6 / left internal mammary artery graft to the LAD, a saphenous vein graft to the diagonal, a sequential saphenous vein graft to the 1st and 2nd obtuse marginal branches, and a sequential saphenous vein graft the the distal right coronary artery and posterior descending  . Anemia   . CKD (chronic kidney disease)   . Advanced age     Past  Surgical History  Procedure Date  . Coronary artery bypass graft 2002    x6 per Dr. Laneta Simmers  . Eye surgery 1987 & 1988     left eye  . Vascular surgery     multiple peripheral vascular surgeries  . Left aka 2006  . Right renal artery stent 2004  . Rfpbpg 2002    History  Smoking status  . Never Smoker   Smokeless tobacco  . Never Used    History  Alcohol Use No    Family History  Problem Relation Age of Onset  . Coronary artery disease      prevalent in sibling  . Heart disease Mother     Review of Systems: The review of systems is per the HPI.  All other systems were reviewed and are negative.  Physical Exam: BP 170/40  Pulse 66  Ht 5\' 4"  (1.626 m)  Wt 113 lb (51.256 kg)  BMI 19.40 kg/m2 Patient is very pleasant and in no acute distress. Skin is warm and dry. Color is normal.  HEENT is unremarkable. Normocephalic/atraumatic. PERRL. Sclera are nonicteric. Neck is supple. No masses. No JVD. Lungs are clear.  Cardiac exam shows a regular rate and rhythm. Abdomen is soft. Extremities are without significant edema. She has a left AKA. Gait and ROM are intact. She is a little slow but fairly steady with her walker. No gross neurologic deficits noted.   LABORATORY DATA: Lab Results  Component Value Date   WBC 4.9 04/17/2012   HGB 12.8 04/17/2012   HCT 35.6* 04/17/2012   PLT 249 04/17/2012   GLUCOSE 170* 04/17/2012   CHOL 107 05/30/2011   TRIG 178.0* 05/30/2011   HDL 29.20* 05/30/2011   LDLCALC 42 05/30/2011   ALT 15 05/30/2011   AST 21 05/30/2011   NA 124* 04/17/2012   K 3.2* 04/17/2012   CL 84* 04/17/2012   CREATININE 1.67* 04/17/2012   BUN 38* 04/17/2012   CO2 26 04/17/2012   HGBA1C 6.7* 01/28/2010    Assessment / Plan:

## 2012-04-27 ENCOUNTER — Telehealth: Payer: Self-pay | Admitting: *Deleted

## 2012-04-27 DIAGNOSIS — IMO0001 Reserved for inherently not codable concepts without codable children: Secondary | ICD-10-CM

## 2012-04-27 NOTE — Telephone Encounter (Signed)
Message copied by Burnell Blanks on Fri Apr 27, 2012  1:52 PM ------      Message from: Vista Mink D      Created: Fri Apr 27, 2012  9:54 AM                   ----- Message -----         From: Rosalio Macadamia, NP         Sent: 04/27/2012   7:38 AM           To: Awilda Bill, CMA            Please call. May need to speak to family. She needs to cut back her intake of water to about 1500 ml per day. Recheck BMET on her return visit.

## 2012-04-27 NOTE — Telephone Encounter (Signed)
Advised of labs 

## 2012-05-07 ENCOUNTER — Encounter: Payer: Self-pay | Admitting: Nurse Practitioner

## 2012-05-07 ENCOUNTER — Ambulatory Visit (INDEPENDENT_AMBULATORY_CARE_PROVIDER_SITE_OTHER): Payer: Medicare Other | Admitting: Nurse Practitioner

## 2012-05-07 ENCOUNTER — Other Ambulatory Visit (INDEPENDENT_AMBULATORY_CARE_PROVIDER_SITE_OTHER): Payer: Medicare Other

## 2012-05-07 VITALS — BP 140/50 | HR 64 | Ht 65.0 in | Wt 107.6 lb

## 2012-05-07 DIAGNOSIS — R0989 Other specified symptoms and signs involving the circulatory and respiratory systems: Secondary | ICD-10-CM

## 2012-05-07 DIAGNOSIS — I10 Essential (primary) hypertension: Secondary | ICD-10-CM

## 2012-05-07 DIAGNOSIS — I2510 Atherosclerotic heart disease of native coronary artery without angina pectoris: Secondary | ICD-10-CM

## 2012-05-07 LAB — BASIC METABOLIC PANEL
BUN: 52 mg/dL — ABNORMAL HIGH (ref 6–23)
CO2: 29 mEq/L (ref 19–32)
Calcium: 9.1 mg/dL (ref 8.4–10.5)
Chloride: 104 mEq/L (ref 96–112)
Creatinine, Ser: 2.2 mg/dL — ABNORMAL HIGH (ref 0.4–1.2)
GFR: 27.59 mL/min — ABNORMAL LOW (ref >60.00)
Glucose, Bld: 186 mg/dL — ABNORMAL HIGH (ref 70–99)
Potassium: 4.2 mEq/L (ref 3.5–5.1)
Sodium: 140 mEq/L (ref 135–145)

## 2012-05-07 NOTE — Progress Notes (Signed)
Adrienne Bowman Date of Birth: 1918/06/29 Medical Record #[id number]  History of Present Illness: Adrienne Bowman is seen back today for a 2 week check. She is seen for Dr. Antoine Poche. She has known CAD, prior CABG, HTN, DM, PVD with left AKA and recent angioplaty of the right femoral posterior tibial bypass per Dr. Myra Gianotti. She has had renal failure with ACE and aldactone in the past. She has never wanted much done for her and does not like to take her medicines. Most recently has had worsening BP and was in the ER and her ACE was restarted (she never took). When I last saw her, we increased her Hydralazine.   She comes in today. She is here with her family member. She feels good. No real complaint. Not checking her blood pressure at home but she says she is taking her medicines "for the most part". No more headache. No chest pain. Sodium was low on her labs and she has cut back on her intake of water. Needs repeat BMET today.   Current Outpatient Prescriptions on File Prior to Visit  Medication Sig Dispense Refill  . amlodipine-atorvastatin (CADUET) 10-10 MG per tablet Take 1 tablet by mouth daily.      [name] aspirin 81 MG tablet Take 81 mg by mouth daily.        [name] atenolol (TENORMIN) 50 MG tablet Take 50 mg by mouth daily.      . cloNIDine (CATAPRES) 0.3 MG tablet Take 0.3 mg by mouth 2 (two) times daily.      . famotidine (PEPCID) 20 MG tablet Take 20 mg by mouth 2 (two) times daily.      . furosemide (LASIX) 40 MG tablet Take 40 mg by mouth daily.      . hydrALAZINE (APRESOLINE) 100 MG tablet Take 1 tablet (100 mg total) by mouth 3 (three) times daily.  90 tablet  3  . multivitamin (THERAGRAN) per tablet Take 1 tablet by mouth daily.        . potassium chloride SA (KLOR-CON M20) 20 MEQ tablet Take 20 mEq by mouth daily.        No Known Allergies  Past Medical History  Diagnosis Date  . CHF (congestive heart failure) 2002    acute CHF while getting lower extremilty arteriograms /  Led to CABG  x6    . Hypertension     very difficult to control  . PVD (peripheral vascular disease)     extensive PVD / left AKA  . Murmur, heart     soft systolic outflow murmur and a grade 2/6 murmur of the aortic insufficiency  . S/P AKA (above knee amputation)     left AKA with prosthesis  . DIABETES MELLITUS, TYPE II   . NEPHROPATHY, DIABETIC   . Coronary artery disease 2002    CABG x6 / left internal mammary artery graft to the LAD, a saphenous vein graft to the diagonal, a sequential saphenous vein graft to the 1st and 2nd obtuse marginal branches, and a sequential saphenous vein graft the the distal right coronary artery and posterior descending  . Anemia   . CKD (chronic kidney disease)   . Advanced age     Past Surgical History  Procedure Date  . Coronary artery bypass graft 2002    x6 per Dr. Laneta Simmers  . Eye surgery 1987 & 1988     left eye  . Vascular surgery     multiple peripheral vascular surgeries  . Left aka  2006  . Right renal artery stent 2004  . Rfpbpg 2002    History  Smoking status  . Never Smoker   Smokeless tobacco  . Never Used    History  Alcohol Use No    Family History  Problem Relation Age of Onset  . Coronary artery disease      prevalent in sibling  . Heart disease Mother     Review of Systems: The review of systems is per the HPI.  All other systems were reviewed and are negative.  Physical Exam: BP 170/44  Pulse 64  Ht 5\' 5"  (1.651 m)  Wt 107 lb 9.6 oz (48.807 kg)  BMI 17.91 kg/m2  SpO2 99% Blood pressure down to 140/50 by me in the right arm. Patient is very pleasant and in no acute distress. Skin is warm and dry. Color is normal.  HEENT is unremarkable. Normocephalic/atraumatic. PERRL. Sclera are nonicteric. Neck is supple. No masses. No JVD. Lungs are clear. Cardiac exam shows a regular rate and rhythm. Abdomen is soft. Extremities are without edema. Left AKA. Gait and ROM are intact. She is using a walker. No gross neurologic deficits  noted.  LABORATORY DATA: Repeat BMET is pending.  Lab Results  Component Value Date   WBC 4.9 04/17/2012   HGB 12.8 04/17/2012   HCT 35.6* 04/17/2012   PLT 249 04/17/2012   GLUCOSE 133* 04/26/2012   CHOL 107 05/30/2011   TRIG 178.0* 05/30/2011   HDL 29.20* 05/30/2011   LDLCALC 42 05/30/2011   ALT 15 05/30/2011   AST 21 05/30/2011   NA 125* 04/26/2012   K 3.9 04/26/2012   CL 88* 04/26/2012   CREATININE 1.6* 04/26/2012   BUN 32* 04/26/2012   CO2 30 04/26/2012   HGBA1C 6.7* 01/28/2010      Assessment / Plan:

## 2012-05-07 NOTE — Patient Instructions (Signed)
I am happy with how you are doing. Your blood pressure has come down nicely.   We will recheck your labs today.  Stay on your current medicines.   I want to see you in 4 months.  Call the [facility] office at [phone] if you have any questions, problems or concerns.

## 2012-05-07 NOTE — Assessment & Plan Note (Signed)
Blood pressure is much better. She had taken her medicines about 2 hours before today's visit. I have left her on her current regimen. Will see her back in 4 months. Recheck her BMET today. Will see her back in 4 months. Patient is agreeable to this plan and will call if any problems develop in the interim.

## 2012-05-21 ENCOUNTER — Encounter: Payer: Self-pay | Admitting: Vascular Surgery

## 2012-05-22 ENCOUNTER — Ambulatory Visit: Payer: Medicare Other | Admitting: Vascular Surgery

## 2012-06-11 ENCOUNTER — Encounter: Payer: Self-pay | Admitting: Vascular Surgery

## 2012-06-12 ENCOUNTER — Ambulatory Visit (INDEPENDENT_AMBULATORY_CARE_PROVIDER_SITE_OTHER): Payer: Medicare Other | Admitting: Vascular Surgery

## 2012-06-12 ENCOUNTER — Ambulatory Visit (INDEPENDENT_AMBULATORY_CARE_PROVIDER_SITE_OTHER): Payer: Medicare Other | Admitting: *Deleted

## 2012-06-12 ENCOUNTER — Encounter: Payer: Self-pay | Admitting: Vascular Surgery

## 2012-06-12 ENCOUNTER — Encounter (INDEPENDENT_AMBULATORY_CARE_PROVIDER_SITE_OTHER): Payer: Medicare Other | Admitting: *Deleted

## 2012-06-12 VITALS — BP 197/72 | HR 65 | Resp 18 | Ht 65.0 in | Wt 112.0 lb

## 2012-06-12 DIAGNOSIS — Z48812 Encounter for surgical aftercare following surgery on the circulatory system: Secondary | ICD-10-CM

## 2012-06-12 DIAGNOSIS — I739 Peripheral vascular disease, unspecified: Secondary | ICD-10-CM

## 2012-06-12 DIAGNOSIS — I70219 Atherosclerosis of native arteries of extremities with intermittent claudication, unspecified extremity: Secondary | ICD-10-CM

## 2012-06-12 DIAGNOSIS — L98499 Non-pressure chronic ulcer of skin of other sites with unspecified severity: Secondary | ICD-10-CM

## 2012-06-12 NOTE — Progress Notes (Signed)
Subjective:     Patient ID: Adrienne Bowman, female   DOB: 04/16/1918, 78 y.o.   MRN: [id number]  HPI this 78 year old female had angiography and intervention of a distal vein graft stenosis performed by Dr. Myra Gianotti several weeks ago. Patient had developed a 90% stenosis in the distal aspect of her vein graft which is anastomosed to the distal posterior tibial artery. She states the foot feels much better. I reviewed the angiograms and intervention images which reveal good resolution of the vein graft stenosis. She has a contralateral above-knee dictation.  Past Medical History  Diagnosis Date  . CHF (congestive heart failure) 2002    acute CHF while getting lower extremilty arteriograms /  Led to CABG  x6  . Hypertension     very difficult to control  . PVD (peripheral vascular disease)     extensive PVD / left AKA  . Murmur, heart     soft systolic outflow murmur and a grade 2/6 murmur of the aortic insufficiency  . S/P AKA (above knee amputation)     left AKA with prosthesis  . DIABETES MELLITUS, TYPE II   . NEPHROPATHY, DIABETIC   . Coronary artery disease 2002    CABG x6 / left internal mammary artery graft to the LAD, a saphenous vein graft to the diagonal, a sequential saphenous vein graft to the 1st and 2nd obtuse marginal branches, and a sequential saphenous vein graft the the distal right coronary artery and posterior descending  . Anemia   . CKD (chronic kidney disease)   . Advanced age     History  Substance Use Topics  . Smoking status: Never Smoker   . Smokeless tobacco: Never Used  . Alcohol Use: No    Family History  Problem Relation Age of Onset  . Coronary artery disease      prevalent in sibling  . Heart disease Mother     No Known Allergies  Current outpatient prescriptions:amlodipine-atorvastatin (CADUET) 10-10 MG per tablet, Take 1 tablet by mouth daily., Disp: , Rfl: ;  aspirin 81 MG tablet, Take 81 mg by mouth daily.  , Disp: , Rfl: ;  atenolol  (TENORMIN) 50 MG tablet, Take 50 mg by mouth daily., Disp: , Rfl: ;  cloNIDine (CATAPRES) 0.3 MG tablet, Take 0.3 mg by mouth 2 (two) times daily., Disp: , Rfl:  famotidine (PEPCID) 20 MG tablet, Take 20 mg by mouth 2 (two) times daily., Disp: , Rfl: ;  furosemide (LASIX) 40 MG tablet, Take 40 mg by mouth daily., Disp: , Rfl: ;  hydrALAZINE (APRESOLINE) 100 MG tablet, Take 1 tablet (100 mg total) by mouth 3 (three) times daily., Disp: 90 tablet, Rfl: 3;  multivitamin (THERAGRAN) per tablet, Take 1 tablet by mouth daily.  , Disp: , Rfl:  potassium chloride SA (KLOR-CON M20) 20 MEQ tablet, Take 20 mEq by mouth daily., Disp: , Rfl:   BP 197/72  Pulse 65  Resp 18  Ht 5\' 5"  (1.651 m)  Wt 112 lb (50.803 kg)  BMI 18.64 kg/m2  Body mass index is 18.64 kg/(m^2).          Review of Systems denies chest pain, dyspnea on exertion, PND, orthopnea.    Objective:   Physical Exam pressure 197/72 heart rate 65 respirations 18 General she is an elderly females in no apparent stress alert and oriented x3 Lungs no rhonchi or wheezing Right lower extremity with 3+ graft pulse in the mid calf and 3+ posterior tibial pulse palpable  the ankle. Left leg has prosthesis in place with above-knee amputation  Today I ordered a scan of the right lower extremity graft. This reveals excellent wave forms throughout the bypass with one area of increased velocity of 375 cm/s at the right ankle level which was in the native vessel not in the vein graft. ABI is now 0.98.     Assessment:     Successful PTA distal vein graft stenosis right SFA to PT bypass    Plan:     We'll continue to monitor for recurrent stenosis of vein graft Return in 3 months for duplex scan of bypass and repeat ABIs and see nurse practitioner

## 2012-06-25 NOTE — Procedures (Unsigned)
BYPASS GRAFT EVALUATION  INDICATION:  Right lower extremity bypass graft.  HISTORY: Diabetes:  Yes. Cardiac:  CABG. Hypertension:  Yes. Smoking:  No. Previous Surgery:  Right femoral-to-posterior tibial artery bypass graft on 08/31/2011.  SINGLE LEVEL ARTERIAL EXAM                              RIGHT              LEFT Brachial: Anterior tibial: Posterior tibial: Peroneal: Ankle/brachial index:  PREVIOUS ABI:  Date:  RIGHT:  LEFT:  LOWER EXTREMITY BYPASS GRAFT DUPLEX EXAM:  DUPLEX:  Biphasic Doppler waveforms noted throughout the right lower extremity bypass graft.  No hemodynamically significant atherosclerosis noted in the right common femoral and distal bypass graft, with velocities of 375 cm/s noted in the right ankle level posterior tibial artery.  This increase in velocity appears to be due to plaque formation versus change in vessel diameter.  IMPRESSION: 1. Patent right femoral-to-posterior tibial artery bypass graft with     elevated velocity as described above. 2. Bilateral ankle brachial indices are noted on a separate report.  ___________________________________________ Quita Skye. Hart Rochester, M.D.  [facility]  D:  06/15/2012  T:  06/15/2012  Job:  [id number]

## 2012-08-16 ENCOUNTER — Ambulatory Visit (INDEPENDENT_AMBULATORY_CARE_PROVIDER_SITE_OTHER): Payer: Medicare Other | Admitting: Nurse Practitioner

## 2012-08-16 ENCOUNTER — Encounter: Payer: Self-pay | Admitting: Nurse Practitioner

## 2012-08-16 VITALS — BP 185/64 | HR 75 | Ht 65.0 in | Wt 114.0 lb

## 2012-08-16 DIAGNOSIS — I10 Essential (primary) hypertension: Secondary | ICD-10-CM

## 2012-08-16 DIAGNOSIS — E785 Hyperlipidemia, unspecified: Secondary | ICD-10-CM

## 2012-08-16 LAB — LDL CHOLESTEROL, DIRECT: Direct LDL: 59.6 mg/dL

## 2012-08-16 LAB — LIPID PANEL
Cholesterol: 131 mg/dL (ref 0–200)
HDL: 39.8 mg/dL (ref >39.00)
Total CHOL/HDL Ratio: 3
Triglycerides: 230 mg/dL — ABNORMAL HIGH (ref 0.0–149.0)
VLDL: 46 mg/dL — ABNORMAL HIGH (ref 0.0–40.0)

## 2012-08-16 LAB — HEPATIC FUNCTION PANEL
ALT: 17 U/L (ref 0–35)
AST: 22 U/L (ref 0–37)
Albumin: 3.7 g/dL (ref 3.5–5.2)
Alkaline Phosphatase: 56 U/L (ref 39–117)
Bilirubin, Direct: 0.1 mg/dL (ref 0.0–0.3)
Total Bilirubin: 0.6 mg/dL (ref 0.3–1.2)
Total Protein: 6.7 g/dL (ref 6.0–8.3)

## 2012-08-16 LAB — BASIC METABOLIC PANEL
BUN: 41 mg/dL — ABNORMAL HIGH (ref 6–23)
CO2: 30 mEq/L (ref 19–32)
Calcium: 9.3 mg/dL (ref 8.4–10.5)
Chloride: 102 mEq/L (ref 96–112)
Creatinine, Ser: 1.8 mg/dL — ABNORMAL HIGH (ref 0.4–1.2)
GFR: 34.06 mL/min — ABNORMAL LOW (ref >60.00)
Glucose, Bld: 276 mg/dL — ABNORMAL HIGH (ref 70–99)
Potassium: 3.8 mEq/L (ref 3.5–5.1)
Sodium: 139 mEq/L (ref 135–145)

## 2012-08-16 NOTE — Patient Instructions (Signed)
Let your family monitor your blood pressure. Call me with some readings next week.  Stay on your medicines  I will see you in 4 months  We are going to check labs today.  Call the [facility] office at [phone] if you have any questions, problems or concerns.

## 2012-08-16 NOTE — Progress Notes (Signed)
[name] Date of Birth: Jul 28, 1918 Medical Record #[id number]  History of Present Illness: Adrienne Bowman is seen back today for a 4 month check. She is seen for Dr. Antoine Poche. She has known CAD with prior CABG, HTN, DM, PVD with left AKA and prior angioplasty of the right femoral posterior tibial bypass per Dr. Myra Gianotti earlier this summer. She has progressive kidney failure. She is no longer on ACE or aldactone. She has never wanted much done for her and does not like to take her medicines.   She comes in today. She is here with a family member. She says she is doing well, "still alive". No chest pain. Not short of breath. Says she has been taking her medicines and that her blood pressure has been better at home. Not sure if I totally believe her. She just took her medicines this morning as she was coming to see me. She gets out of the house most days and tries to stay active. She will be turning 89 next week.   Current Outpatient Prescriptions on File Prior to Visit  Medication Sig Dispense Refill  . amlodipine-atorvastatin (CADUET) 10-10 MG per tablet Take 1 tablet by mouth daily.      [name] aspirin 81 MG tablet Take 81 mg by mouth daily.        [name] atenolol (TENORMIN) 50 MG tablet Take 50 mg by mouth daily.      . cloNIDine (CATAPRES) 0.3 MG tablet Take 0.3 mg by mouth 2 (two) times daily.      . famotidine (PEPCID) 20 MG tablet Take 20 mg by mouth 2 (two) times daily.      . furosemide (LASIX) 40 MG tablet Take 40 mg by mouth daily.      . hydrALAZINE (APRESOLINE) 100 MG tablet Take 1 tablet (100 mg total) by mouth 3 (three) times daily.  90 tablet  3  . multivitamin (THERAGRAN) per tablet Take 1 tablet by mouth daily.        . potassium chloride SA (KLOR-CON M20) 20 MEQ tablet Take 20 mEq by mouth daily.        No Known Allergies  Past Medical History  Diagnosis Date  . CHF (congestive heart failure) 2002    acute CHF while getting lower extremilty arteriograms /  Led to CABG  x6  .  Hypertension     very difficult to control  . PVD (peripheral vascular disease)     extensive PVD / left AKA  . Murmur, heart     soft systolic outflow murmur and a grade 2/6 murmur of the aortic insufficiency  . S/P AKA (above knee amputation)     left AKA with prosthesis  . DIABETES MELLITUS, TYPE II   . NEPHROPATHY, DIABETIC   . Coronary artery disease 2002    CABG x6 / left internal mammary artery graft to the LAD, a saphenous vein graft to the diagonal, a sequential saphenous vein graft to the 1st and 2nd obtuse marginal branches, and a sequential saphenous vein graft the the distal right coronary artery and posterior descending  . Anemia   . CKD (chronic kidney disease)   . Advanced age     Past Surgical History  Procedure Date  . Coronary artery bypass graft 2002    x6 per Dr. Laneta Simmers  . Eye surgery 1987 & 1988     left eye  . Vascular surgery     multiple peripheral vascular surgeries  . Left aka 2006  .  Right renal artery stent 2004  . Rfpbpg 2002    History  Smoking status  . Never Smoker   Smokeless tobacco  . Never Used    History  Alcohol Use No    Family History  Problem Relation Age of Onset  . Coronary artery disease      prevalent in sibling  . Heart disease Mother     Review of Systems: The review of systems is per the HPI.  All other systems were reviewed and are negative.  Physical Exam: BP 185/64  Pulse 75  Ht 5\' 5"  (1.651 m)  Wt 114 lb (51.71 kg)  BMI 18.97 kg/m2 Patient is very pleasant and in no acute distress. She is thin but has gained some weight over the past 4 months. Skin is warm and dry. Color is normal.  HEENT is unremarkable. Normocephalic/atraumatic. PERRL. Sclera are nonicteric. Neck is supple. No masses. No JVD. Lungs are clear. Cardiac exam shows a regular rate and rhythm. Abdomen is soft. Extremities are without edema. Left leg prosthesis in place. Gait is fairly steady. ROM appears intact. No gross neurologic deficits  noted.   LABORATORY DATA: Pending for today.  Lab Results  Component Value Date   WBC 4.9 04/17/2012   HGB 12.8 04/17/2012   HCT 35.6* 04/17/2012   PLT 249 04/17/2012   GLUCOSE 186* 05/07/2012   CHOL 107 05/30/2011   TRIG 178.0* 05/30/2011   HDL 29.20* 05/30/2011   LDLCALC 42 05/30/2011   ALT 15 05/30/2011   AST 21 05/30/2011   NA 140 05/07/2012   K 4.2 05/07/2012   CL 104 05/07/2012   CREATININE 2.2* 05/07/2012   BUN 52* 05/07/2012   CO2 29 05/07/2012   HGBA1C 6.7* 01/28/2010     Assessment / Plan: 1. HTN - she just took her medicines literally on her arrival here. Does not want any more medicines added. She says she will let her family check some readings for me and will call with an update.   2. CAD - prior CABG - no symptoms  3. CKD  4. HLD - needs fasting labs today  We will check labs today. I will see her back in about 4 months. I wished her an early "Happy Birthday". Patient is agreeable to this plan and will call if any problems develop in the interim.

## 2012-08-17 ENCOUNTER — Telehealth: Payer: Self-pay | Admitting: *Deleted

## 2012-08-17 NOTE — Telephone Encounter (Signed)
Message copied by Tarri Fuller on Fri Aug 17, 2012  4:53 PM ------      Message from: Rosalio Macadamia      Created: Thu Aug 16, 2012  4:54 PM       Ok to report. Labs are satisfactory.  Sugar is up. Renal function stable. Stay on same medicines. (She had just eaten breakfast prior to visit.)

## 2012-08-17 NOTE — Telephone Encounter (Signed)
Message copied by Tarri Fuller on Fri Aug 17, 2012  4:54 PM ------      Message from: Rosalio Macadamia      Created: Thu Aug 16, 2012  4:54 PM       Ok to report. Labs are satisfactory.  Sugar is up. Renal function stable. Stay on same medicines. (She had just eaten breakfast prior to visit.)

## 2012-08-17 NOTE — Telephone Encounter (Signed)
daughter aware of lab results w/verbal understanding today

## 2012-08-24 ENCOUNTER — Encounter (INDEPENDENT_AMBULATORY_CARE_PROVIDER_SITE_OTHER): Payer: Medicare Other | Admitting: Ophthalmology

## 2012-09-07 ENCOUNTER — Other Ambulatory Visit: Payer: Self-pay | Admitting: *Deleted

## 2012-09-07 DIAGNOSIS — Z48812 Encounter for surgical aftercare following surgery on the circulatory system: Secondary | ICD-10-CM

## 2012-09-07 DIAGNOSIS — I739 Peripheral vascular disease, unspecified: Secondary | ICD-10-CM

## 2012-09-11 ENCOUNTER — Other Ambulatory Visit: Payer: Self-pay | Admitting: Nurse Practitioner

## 2012-09-11 ENCOUNTER — Encounter: Payer: Self-pay | Admitting: Neurosurgery

## 2012-09-12 ENCOUNTER — Ambulatory Visit (INDEPENDENT_AMBULATORY_CARE_PROVIDER_SITE_OTHER): Payer: Medicare Other | Admitting: Neurosurgery

## 2012-09-12 ENCOUNTER — Encounter (INDEPENDENT_AMBULATORY_CARE_PROVIDER_SITE_OTHER): Payer: Medicare Other | Admitting: *Deleted

## 2012-09-12 ENCOUNTER — Encounter: Payer: Self-pay | Admitting: Neurosurgery

## 2012-09-12 VITALS — BP 214/71 | HR 73 | Resp 16 | Ht 64.0 in | Wt 105.0 lb

## 2012-09-12 DIAGNOSIS — I739 Peripheral vascular disease, unspecified: Secondary | ICD-10-CM

## 2012-09-12 DIAGNOSIS — Z48812 Encounter for surgical aftercare following surgery on the circulatory system: Secondary | ICD-10-CM

## 2012-09-12 NOTE — Progress Notes (Signed)
[facility] [place] PAD/PVD Office Note  CC: PVD surveillance Referring Physician: Hart Rochester  History of Present Illness: 78 year old female patient of Dr. Hart Rochester status post a right femoral to distal posterior tibial bypass graft in October 2012. The patient denies claudication or rest pain and has no open ulcerations on her lower extremities. The patient denies any other vascular difficulties.  Past Medical History  Diagnosis Date  . CHF (congestive heart failure) 2002    acute CHF while getting lower extremilty arteriograms /  Led to CABG  x6  . Hypertension     very difficult to control  . PVD (peripheral vascular disease)     extensive PVD / left AKA  . Murmur, heart     soft systolic outflow murmur and a grade 2/6 murmur of the aortic insufficiency  . S/P AKA (above knee amputation)     left AKA with prosthesis  . DIABETES MELLITUS, TYPE II   . NEPHROPATHY, DIABETIC   . Coronary artery disease 2002    CABG x6 / left internal mammary artery graft to the LAD, a saphenous vein graft to the diagonal, a sequential saphenous vein graft to the 1st and 2nd obtuse marginal branches, and a sequential saphenous vein graft the the distal right coronary artery and posterior descending  . Anemia   . CKD (chronic kidney disease)   . Advanced age     ROS: [x]  Positive   [ ]  Denies    General: [ ]  Weight loss, [ ]  Fever, [ ]  chills Neurologic: [ ]  Dizziness, [ ]  Blackouts, [ ]  Seizure [ ]  Stroke, [ ]  "Mini stroke", [ ]  Slurred speech, [ ]  Temporary blindness; [ ]  weakness in arms or legs, [ ]  Hoarseness Cardiac: [ ]  Chest pain/pressure, [ ]  Shortness of breath at rest [ ]  Shortness of breath with exertion, [ ]  Atrial fibrillation or irregular heartbeat Vascular: [ ]  Pain in legs with walking, [ ]  Pain in legs at rest, [ ]  Pain in legs at night,  [ ]  Non-healing ulcer, [ ]  Blood clot in vein/DVT,   Pulmonary: [ ]  Home oxygen, [ ]  Productive cough, [ ]  Coughing up blood, [  ] Asthma,  [ ]  Wheezing Musculoskeletal:  [ ]  Arthritis, [ ]  Low back pain, [ ]  Joint pain Hematologic: [ ]  Easy Bruising, [ ]  Anemia; [ ]  Hepatitis Gastrointestinal: [ ]  Blood in stool, [ ]  Gastroesophageal Reflux/heartburn, [ ]  Trouble swallowing Urinary: [ ]  chronic Kidney disease, [ ]  on HD - [ ]  MWF or [ ]  TTHS, [ ]  Burning with urination, [ ]  Difficulty urinating Skin: [ ]  Rashes, [ ]  Wounds Psychological: [ ]  Anxiety, [ ]  Depression   Social History History  Substance Use Topics  . Smoking status: Never Smoker   . Smokeless tobacco: Never Used  . Alcohol Use: No    Family History Family History  Problem Relation Age of Onset  . Coronary artery disease      prevalent in sibling  . Heart disease Mother     No Known Allergies  Current Outpatient Prescriptions  Medication Sig Dispense Refill  . amlodipine-atorvastatin (CADUET) 10-10 MG per tablet Take 1 tablet by mouth daily.      [name] aspirin 81 MG tablet Take 81 mg by mouth daily.        [name] atenolol (TENORMIN) 50 MG tablet Take 50 mg by mouth daily.      [name] atenolol (TENORMIN) 50 MG tablet TAKE ONE TABLET  BY MOUTH EVERY DAY  30 tablet  9  . cloNIDine (CATAPRES) 0.3 MG tablet Take 0.3 mg by mouth 2 (two) times daily.      . cloNIDine (CATAPRES) 0.3 MG tablet TAKE ONE TABLET BY MOUTH TWICE DAILY  60 tablet  9  . famotidine (PEPCID) 20 MG tablet Take 20 mg by mouth 2 (two) times daily.      . furosemide (LASIX) 40 MG tablet Take 40 mg by mouth daily.      . hydrALAZINE (APRESOLINE) 100 MG tablet Take 1 tablet (100 mg total) by mouth 3 (three) times daily.  90 tablet  3  . multivitamin (THERAGRAN) per tablet Take 1 tablet by mouth daily.        . potassium chloride SA (KLOR-CON M20) 20 MEQ tablet Take 20 mEq by mouth daily.        Physical Examination  Filed Vitals:   09/12/12 1226  BP: 214/71  Pulse: 73  Resp: 16    Body mass index is 18.02 kg/(m^2).  General:  WDWN in NAD Gait: Normal HEENT: WNL Eyes: Pupils  equal Pulmonary: normal non-labored breathing , without Rales, rhonchi,  wheezing Cardiac: RRR, without  Murmurs, rubs or gallops; No carotid bruits Abdomen: soft, NT, no masses Skin: no rashes, ulcers noted Vascular Exam/Pulses: DP is palpable on the right lower extremity, left lower extremity has AKA  Extremities without ischemic changes, no Gangrene , no cellulitis; no open wounds;  Musculoskeletal: no muscle wasting or atrophy  Neurologic: A&O X 3; Appropriate Affect ; SENSATION: normal; MOTOR FUNCTION:  moving all extremities equally. Speech is fluent/normal  Non-Invasive Vascular Imaging: Right lower extremity ABI is 1.05 is slightly improved from July of this year when she was 1.01 she is biphasic. Patent right lower extremity bypass graft with velocities suggesting 50-70% stenosis of the distal graft and greater than 70% stenosis of the right distal posterior tibial artery  ASSESSMENT/PLAN: Asymptomatic patient that needs to be followed in 3 months with repeat ABIs and graft duplex. The patient's in agreement with this plan, her questions were encouraged and answered.  Lauree Chandler ANP  Clinic M.D.: Hart Rochester on call

## 2012-09-13 NOTE — Addendum Note (Signed)
Addended by: Sharee Pimple on: 09/13/2012 12:51 PM   Modules accepted: Orders

## 2012-09-18 ENCOUNTER — Encounter (INDEPENDENT_AMBULATORY_CARE_PROVIDER_SITE_OTHER): Payer: Medicare Other | Admitting: Ophthalmology

## 2012-09-18 DIAGNOSIS — I10 Essential (primary) hypertension: Secondary | ICD-10-CM

## 2012-09-18 DIAGNOSIS — E11319 Type 2 diabetes mellitus with unspecified diabetic retinopathy without macular edema: Secondary | ICD-10-CM

## 2012-09-18 DIAGNOSIS — H348392 Tributary (branch) retinal vein occlusion, unspecified eye, stable: Secondary | ICD-10-CM

## 2012-09-18 DIAGNOSIS — H3530 Unspecified macular degeneration: Secondary | ICD-10-CM

## 2012-09-18 DIAGNOSIS — H35039 Hypertensive retinopathy, unspecified eye: Secondary | ICD-10-CM

## 2012-09-18 DIAGNOSIS — E1139 Type 2 diabetes mellitus with other diabetic ophthalmic complication: Secondary | ICD-10-CM

## 2012-10-13 ENCOUNTER — Other Ambulatory Visit: Payer: Self-pay | Admitting: Nurse Practitioner

## 2012-12-12 ENCOUNTER — Other Ambulatory Visit: Payer: Self-pay | Admitting: Cardiology

## 2012-12-13 NOTE — Telephone Encounter (Signed)
Called and spoke to ms. Waln about her hydrALAZINE (APRESOLINE) 100 MG tablet to see if she took it 3 times a day or 2 times a day and she stated she does not need any refills. She stated if she does she will call and let us know or tell us at her appointment on the 4th of feb.

## 2012-12-17 ENCOUNTER — Encounter: Payer: Self-pay | Admitting: Neurosurgery

## 2012-12-18 ENCOUNTER — Ambulatory Visit (INDEPENDENT_AMBULATORY_CARE_PROVIDER_SITE_OTHER): Payer: Medicare Other | Admitting: Neurosurgery

## 2012-12-18 ENCOUNTER — Encounter: Payer: Self-pay | Admitting: Nurse Practitioner

## 2012-12-18 ENCOUNTER — Ambulatory Visit (INDEPENDENT_AMBULATORY_CARE_PROVIDER_SITE_OTHER): Payer: Medicare Other | Admitting: Nurse Practitioner

## 2012-12-18 ENCOUNTER — Other Ambulatory Visit: Payer: Self-pay

## 2012-12-18 ENCOUNTER — Other Ambulatory Visit: Payer: Self-pay | Admitting: *Deleted

## 2012-12-18 ENCOUNTER — Ambulatory Visit (INDEPENDENT_AMBULATORY_CARE_PROVIDER_SITE_OTHER): Payer: Medicare Other | Admitting: Vascular Surgery

## 2012-12-18 ENCOUNTER — Encounter: Payer: Self-pay | Admitting: Neurosurgery

## 2012-12-18 VITALS — BP 180/60 | HR 68 | Ht 65.0 in | Wt 118.0 lb

## 2012-12-18 VITALS — BP 189/57 | HR 69 | Resp 16 | Ht 64.0 in | Wt 118.0 lb

## 2012-12-18 DIAGNOSIS — I10 Essential (primary) hypertension: Secondary | ICD-10-CM

## 2012-12-18 DIAGNOSIS — Z48812 Encounter for surgical aftercare following surgery on the circulatory system: Secondary | ICD-10-CM

## 2012-12-18 DIAGNOSIS — I739 Peripheral vascular disease, unspecified: Secondary | ICD-10-CM

## 2012-12-18 MED ORDER — HYDRALAZINE HCL 100 MG PO TABS: 100.0000 mg | ORAL_TABLET | Freq: Three times a day (TID) | ORAL | Status: DC

## 2012-12-18 MED ORDER — HYDRALAZINE HCL 100 MG PO TABS: 100.0000 mg | ORAL_TABLET | Freq: Two times a day (BID) | ORAL | Status: DC

## 2012-12-18 NOTE — Progress Notes (Signed)
Ankle brachial index performed @ [facility] 12/18/2012 

## 2012-12-18 NOTE — Progress Notes (Signed)
Right lower extremity arterial duplex performed @ [facility] 12/18/2012

## 2012-12-18 NOTE — Telephone Encounter (Signed)
Called pharmacy to confirm correct dosage of hydralazine 100 mg tid was sent in electronically twice dosage was confirmed

## 2012-12-18 NOTE — Patient Instructions (Signed)
Stay on your current medicines but I want you to take the Hydralazine 3 times a day - I have sent this to the drug store  I will see you in 4 months  Call the [facility] office at [phone] if you have any questions, problems or concerns.

## 2012-12-18 NOTE — Progress Notes (Signed)
Adrienne Bowman Date of Birth: 04/15/1918 Medical Record #[id number]  History of Present Illness: Adrienne Bowman is seen back today for a 4 month check. She is seen for Dr. Antoine Poche. She has known CAD with prior CABG, HTN, DM, PVD with left AKA and prior angioplasty of the right femoral posterior tibial bypass per Dr. Myra Gianotti back last summer in 2013. She has progressive kidney failure. She is no longer on ACE or aldactone. She has never wanted much done for her and does not like to take her medicines.   She comes in today. She is here with a family member. She says she is doing well. Has no complaint. Has not been back to her PCP. No flu shot. Not having any issues. Says she is taking her medicines but I have never been totally convinced. No chest pain. Not short of breath. Still getting out and doing errands. Not checking her BP at home. Took her medicines right before her visit today. Says she has seen Dr. [name] at the first of the year and he did labs on her. Seeing [facility] later today.   Current Outpatient Prescriptions on File Prior to Visit  Medication Sig Dispense Refill  . amlodipine-atorvastatin (CADUET) 10-10 MG per tablet Take 1 tablet by mouth daily.      [name] aspirin 81 MG tablet Take 81 mg by mouth daily.        [name] atenolol (TENORMIN) 50 MG tablet TAKE ONE TABLET BY MOUTH EVERY DAY  30 tablet  9  . cloNIDine (CATAPRES) 0.3 MG tablet TAKE ONE TABLET BY MOUTH TWICE DAILY  60 tablet  9  . famotidine (PEPCID) 20 MG tablet Take 1 tablet (20 mg total) by mouth 2 (two) times daily.  60 tablet  6  . furosemide (LASIX) 40 MG tablet Take 40 mg by mouth daily.      . multivitamin (THERAGRAN) per tablet Take 1 tablet by mouth daily.        . potassium chloride SA (KLOR-CON M20) 20 MEQ tablet Take 20 mEq by mouth daily.        No Known Allergies  Past Medical History  Diagnosis Date  . CHF (congestive heart failure) 2002    acute CHF while getting lower extremilty arteriograms /  Led to CABG  x6  .  Hypertension     very difficult to control  . PVD (peripheral vascular disease)     extensive PVD / left AKA  . Murmur, heart     soft systolic outflow murmur and a grade 2/6 murmur of the aortic insufficiency  . S/P AKA (above knee amputation)     left AKA with prosthesis  . DIABETES MELLITUS, TYPE II   . NEPHROPATHY, DIABETIC   . Coronary artery disease 2002    CABG x6 / left internal mammary artery graft to the LAD, a saphenous vein graft to the diagonal, a sequential saphenous vein graft to the 1st and 2nd obtuse marginal branches, and a sequential saphenous vein graft the the distal right coronary artery and posterior descending  . Anemia   . CKD (chronic kidney disease)   . Advanced age     Past Surgical History  Procedure Date  . Coronary artery bypass graft 2002    x6 per Dr. Laneta Simmers  . Eye surgery 1987 & 1988     left eye  . Vascular surgery     multiple peripheral vascular surgeries  . Left aka 2006  . Right renal artery stent  2004  . Rfpbpg 2002  . Pr vein bypass graft,aorto-fem-pop Oct. 17, 2012    Right  Fem-Distal post. Tibial artery BPG.    History  Smoking status  . Never Smoker   Smokeless tobacco  . Never Used    History  Alcohol Use No    Family History  Problem Relation Age of Onset  . Coronary artery disease      prevalent in sibling  . Heart disease Mother     Review of Systems: The review of systems is per the HPI.  All other systems were reviewed and are negative.  Physical Exam: BP 200/48  Pulse 68  Ht 5\' 5"  (1.651 m)  Wt 118 lb (53.524 kg)  BMI 19.64 kg/m2 BP in the right arm is 200/60 and 180/60 in the left by me.  Patient is very pleasant and in no acute distress. Quite jovial. Skin is warm and dry. Color is normal.  HEENT is unremarkable. Normocephalic/atraumatic. PERRL. Sclera are nonicteric. Neck is supple. No masses. No JVD. Lungs are clear. Cardiac exam shows a regular rate and rhythm. Abdomen is soft. Extremities are without  edema. Left AKA noted. Gait and ROM are intact. She is using a walker and ambulates fairly well. No gross neurologic deficits noted.   LABORATORY DATA:  Lab Results  Component Value Date   WBC 4.9 04/17/2012   HGB 12.8 04/17/2012   HCT 35.6* 04/17/2012   PLT 249 04/17/2012   GLUCOSE 276* 08/16/2012   CHOL 131 08/16/2012   TRIG 230.0* 08/16/2012   HDL 39.80 08/16/2012   LDLDIRECT 59.6 08/16/2012   LDLCALC 42 05/30/2011   ALT 17 08/16/2012   AST 22 08/16/2012   NA 139 08/16/2012   K 3.8 08/16/2012   CL 102 08/16/2012   CREATININE 1.8* 08/16/2012   BUN 41* 08/16/2012   CO2 30 08/16/2012   HGBA1C 6.7* 01/28/2010   Assessment / Plan: 1. HTN - she says she is willing to go back to TID dosing of her Hydralazine. She tells me repeatedly that she doesn't want anything done for her and no extra medicines.   2. CAD - remote CABG - no symptoms reported.  3. PVD - sees [facility] later today  4. Advanced age - now 55 and actually doing remarkably well given all of her issues and reluctance for her health care needs.  Patient is agreeable to this plan and will call if any problems develop in the interim.

## 2012-12-18 NOTE — Progress Notes (Signed)
[facility] [place] PAD/PVD Office Note  CC: PAD surveillance Referring Physician: Hart Rochester  History of Present Illness: 79 year old female patient of Dr. Hart Rochester who is status post a left AKA in 2006 with a right great toe and second digit amputation in October 2002, the patient also has a history of right femoral to distal PTA bypass in October 2012. The patient denies claudication or rest pain in her right lower extremity. The patient denies any open ulcerations on her right lower extremity.  Past Medical History  Diagnosis Date  . CHF (congestive heart failure) 2002    acute CHF while getting lower extremilty arteriograms /  Led to CABG  x6  . Hypertension     very difficult to control  . PVD (peripheral vascular disease)     extensive PVD / left AKA  . Murmur, heart     soft systolic outflow murmur and a grade 2/6 murmur of the aortic insufficiency  . S/P AKA (above knee amputation)     left AKA with prosthesis  . DIABETES MELLITUS, TYPE II   . NEPHROPATHY, DIABETIC   . Coronary artery disease 2002    CABG x6 / left internal mammary artery graft to the LAD, a saphenous vein graft to the diagonal, a sequential saphenous vein graft to the 1st and 2nd obtuse marginal branches, and a sequential saphenous vein graft the the distal right coronary artery and posterior descending  . Anemia   . CKD (chronic kidney disease)   . Advanced age     ROS: [x]  Positive   [ ]  Denies    General: [ ]  Weight loss, [ ]  Fever, [ ]  chills Neurologic: [ ]  Dizziness, [ ]  Blackouts, [ ]  Seizure [ ]  Stroke, [ ]  "Mini stroke", [ ]  Slurred speech, [ ]  Temporary blindness; [ ]  weakness in arms or legs, [ ]  Hoarseness Cardiac: [ ]  Chest pain/pressure, [ ]  Shortness of breath at rest [ ]  Shortness of breath with exertion, [ ]  Atrial fibrillation or irregular heartbeat Vascular: [ ]  Pain in legs with walking, [ ]  Pain in legs at rest, [ ]  Pain in legs at night,  [ ]  Non-healing ulcer, [ ]   Blood clot in vein/DVT,   Pulmonary: [ ]  Home oxygen, [ ]  Productive cough, [ ]  Coughing up blood, [ ]  Asthma,  [ ]  Wheezing Musculoskeletal:  [ ]  Arthritis, [ ]  Low back pain, [ ]  Joint pain Hematologic: [ ]  Easy Bruising, [ ]  Anemia; [ ]  Hepatitis Gastrointestinal: [ ]  Blood in stool, [ ]  Gastroesophageal Reflux/heartburn, [ ]  Trouble swallowing Urinary: [ ]  chronic Kidney disease, [ ]  on HD - [ ]  MWF or [ ]  TTHS, [ ]  Burning with urination, [ ]  Difficulty urinating Skin: [ ]  Rashes, [ ]  Wounds Psychological: [ ]  Anxiety, [ ]  Depression   Social History History  Substance Use Topics  . Smoking status: Never Smoker   . Smokeless tobacco: Never Used  . Alcohol Use: No    Family History Family History  Problem Relation Age of Onset  . Coronary artery disease      prevalent in sibling  . Heart disease Mother   . Hypertension Daughter     No Known Allergies  Current Outpatient Prescriptions  Medication Sig Dispense Refill  . amlodipine-atorvastatin (CADUET) 10-10 MG per tablet Take 1 tablet by mouth daily.      [name] aspirin 81 MG tablet Take 81 mg by mouth daily.        [name]  atenolol (TENORMIN) 50 MG tablet TAKE ONE TABLET BY MOUTH EVERY DAY  30 tablet  9  . cloNIDine (CATAPRES) 0.3 MG tablet TAKE ONE TABLET BY MOUTH TWICE DAILY  60 tablet  9  . famotidine (PEPCID) 20 MG tablet Take 1 tablet (20 mg total) by mouth 2 (two) times daily.  60 tablet  6  . furosemide (LASIX) 40 MG tablet Take 40 mg by mouth daily.      . hydrALAZINE (APRESOLINE) 100 MG tablet Take 1 tablet (100 mg total) by mouth 3 (three) times daily.  90 tablet  11  . multivitamin (THERAGRAN) per tablet Take 1 tablet by mouth daily.        . potassium chloride SA (KLOR-CON M20) 20 MEQ tablet Take 20 mEq by mouth daily.        Physical Examination  Filed Vitals:   12/18/12 1439  BP: 189/57  Pulse: 69  Resp: 16    Body mass index is 20.25 kg/(m^2).  General:  WDWN in NAD Gait: Normal HEENT: WNL Eyes: Pupils  equal Pulmonary: normal non-labored breathing , without Rales, rhonchi,  wheezing Cardiac: RRR, without  Murmurs, rubs or gallops; No carotid bruits Abdomen: soft, NT, no masses Skin: no rashes, ulcers noted Vascular Exam/Pulses: Palpable femoral pulse on the right with a dampened posterior tibial pulse  Extremities without ischemic changes, no Gangrene , no cellulitis; no open wounds;  Musculoskeletal: no muscle wasting or atrophy  Neurologic: A&O X 3; Appropriate Affect ; SENSATION: normal; MOTOR FUNCTION:  moving all extremities equally. Speech is fluent/normal  Non-Invasive Vascular Imaging: Right ABI today is 0.96 in biphasic to monophasic, duplex graft scan shows a patent graft with elevations of the distal PTA of 483.  ASSESSMENT/PLAN: Dr. Hart Rochester spoke with the patient and explained the need for angiogram of the right lower extremity with runoff and possible PTA. This will be scheduled for 12/25/2012 with Dr. Myra Gianotti. The patient's in agreement with this her followup-year-old be pending that procedure. The patient's questions were encouraged and answered.  Lauree Chandler ANP  Clinic M.D.: Hart Rochester

## 2012-12-24 MED ORDER — SODIUM CHLORIDE 0.9 % IV SOLN
INTRAVENOUS | Status: DC
  Administered 2012-12-25: 10:00:00 via INTRAVENOUS

## 2012-12-25 ENCOUNTER — Other Ambulatory Visit: Payer: Self-pay | Admitting: *Deleted

## 2012-12-25 ENCOUNTER — Encounter (HOSPITAL_COMMUNITY)
Admission: RE | Disposition: A | Discharge status: Home / Self Care | Payer: Self-pay | Source: Ambulatory Visit | Attending: Surgery

## 2012-12-25 ENCOUNTER — Ambulatory Visit (HOSPITAL_COMMUNITY)
Admission: RE | Admit: 2012-12-25 | Discharge: 2012-12-25 | Disposition: A | Discharge status: Home / Self Care | Payer: Medicare Other | Source: Ambulatory Visit | Attending: Surgery | Admitting: Surgery

## 2012-12-25 DIAGNOSIS — I739 Peripheral vascular disease, unspecified: Secondary | ICD-10-CM

## 2012-12-25 DIAGNOSIS — E1149 Type 2 diabetes mellitus with other diabetic neurological complication: Secondary | ICD-10-CM | POA: Insufficient documentation

## 2012-12-25 DIAGNOSIS — N189 Chronic kidney disease, unspecified: Secondary | ICD-10-CM | POA: Insufficient documentation

## 2012-12-25 DIAGNOSIS — I2510 Atherosclerotic heart disease of native coronary artery without angina pectoris: Secondary | ICD-10-CM | POA: Insufficient documentation

## 2012-12-25 DIAGNOSIS — E1142 Type 2 diabetes mellitus with diabetic polyneuropathy: Secondary | ICD-10-CM | POA: Insufficient documentation

## 2012-12-25 DIAGNOSIS — Z48812 Encounter for surgical aftercare following surgery on the circulatory system: Secondary | ICD-10-CM

## 2012-12-25 DIAGNOSIS — I70209 Unspecified atherosclerosis of native arteries of extremities, unspecified extremity: Secondary | ICD-10-CM | POA: Insufficient documentation

## 2012-12-25 DIAGNOSIS — I129 Hypertensive chronic kidney disease with stage 1 through stage 4 chronic kidney disease, or unspecified chronic kidney disease: Secondary | ICD-10-CM | POA: Insufficient documentation

## 2012-12-25 HISTORY — PX: LOWER EXTREMITY ANGIOGRAM: SHX5508

## 2012-12-25 HISTORY — PX: ABDOMINAL ANGIOGRAM: SHX5499

## 2012-12-25 LAB — POCT I-STAT, CHEM 8
BUN: 60 mg/dL — ABNORMAL HIGH (ref 6–23)
Creatinine, Ser: 2.2 mg/dL — ABNORMAL HIGH (ref 0.50–1.10)
Glucose, Bld: 209 mg/dL — ABNORMAL HIGH (ref 70–99)
Sodium: 140 mEq/L (ref 135–145)
TCO2: 25 mmol/L (ref 0–100)

## 2012-12-25 SURGERY — ANGIOGRAM, LOWER EXTREMITY
Anesthesia: LOCAL | Laterality: Right

## 2012-12-25 MED ORDER — HYDRALAZINE HCL 20 MG/ML IJ SOLN
INTRAMUSCULAR | Status: AC
  Filled 2012-12-25: qty 1

## 2012-12-25 MED ORDER — PHENOL 1.4 % MT LIQD
1.0000 | OROMUCOSAL | Status: DC | PRN
Start: 2012-12-25 — End: 2012-12-25

## 2012-12-25 MED ORDER — TRAMADOL HCL 50 MG PO TABS: 50.0000 mg | ORAL_TABLET | Freq: Four times a day (QID) | ORAL | Status: DC | PRN

## 2012-12-25 MED ORDER — GUAIFENESIN-DM 100-10 MG/5ML PO SYRP: 15.0000 mL | ORAL_SOLUTION | ORAL | Status: DC | PRN

## 2012-12-25 MED ORDER — METOPROLOL TARTRATE 1 MG/ML IV SOLN: 2.0000 mg | INTRAVENOUS | Status: DC | PRN

## 2012-12-25 MED ORDER — SODIUM CHLORIDE 0.9 % IV SOLN: 1.0000 mL/kg/h | INTRAVENOUS | Status: DC

## 2012-12-25 MED ORDER — LABETALOL HCL 5 MG/ML IV SOLN: 10.0000 mg | INTRAVENOUS | Status: DC | PRN

## 2012-12-25 MED ORDER — ONDANSETRON HCL 4 MG/2ML IJ SOLN: 4.0000 mg | Freq: Four times a day (QID) | INTRAMUSCULAR | Status: DC | PRN

## 2012-12-25 MED ORDER — ALUM & MAG HYDROXIDE-SIMETH 200-200-20 MG/5ML PO SUSP: 15.0000 mL | ORAL | Status: DC | PRN

## 2012-12-25 MED ORDER — ACETAMINOPHEN 325 MG RE SUPP: 325.0000 mg | RECTAL | Status: DC | PRN

## 2012-12-25 MED ORDER — MORPHINE SULFATE 10 MG/ML IJ SOLN: 1.0000 mg | INTRAMUSCULAR | Status: DC | PRN

## 2012-12-25 MED ORDER — ACETAMINOPHEN 325 MG PO TABS: 325.0000 mg | ORAL_TABLET | ORAL | Status: DC | PRN

## 2012-12-25 MED ORDER — HYDRALAZINE HCL 20 MG/ML IJ SOLN
10.0000 mg | INTRAMUSCULAR | Status: DC | PRN
  Administered 2012-12-25: 10 mg via INTRAVENOUS

## 2012-12-25 MED ORDER — LIDOCAINE HCL (PF) 1 % IJ SOLN
INTRAMUSCULAR | Status: AC
  Filled 2012-12-25: qty 30

## 2012-12-25 MED ORDER — HEPARIN (PORCINE) IN NACL 2-0.9 UNIT/ML-% IJ SOLN
INTRAMUSCULAR | Status: AC
  Filled 2012-12-25: qty 1000

## 2012-12-25 NOTE — Interval H&P Note (Signed)
History and Physical Interval Note:  12/25/2012 1:46 PM  Adrienne Bowman  has presented today for surgery, with the diagnosis of PVD  The various methods of treatment have been discussed with the patient and family. After consideration of risks, benefits and other options for treatment, the patient has consented to  Procedure(s): LOWER EXTREMITY ANGIOGRAM (N/A) as a surgical intervention .  The patient's history has been reviewed, patient examined, no change in status, stable for surgery.  I have reviewed the patient's chart and labs.  Questions were answered to the patient's satisfaction.     Abhiraj Dozal IV, V. WELLS

## 2012-12-25 NOTE — Op Note (Signed)
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  ultrasound-guided access left femoral artery  2.  abdominal aortogram with CO2  3.  right lower extremity runoff  4.  second order catheterization    Indications:  The patient has a history of a right femoral to distal posterior tibial artery bypass graft. Her recent ultrasound revealed a high-grade stenosis at her distal anastomosis. She comes in today for further via wasting and possible intervention. The patient's creatinine was noted to be 2.2 on arrival. She has fluctuated between 1.8 and 2.2 recently. Caliber her admission to the hospital for hydration and an end study tomorrow, however because of the and pending whether she wished to have this done today. I told her we would do this with CO2 and minimal contrast  Procedure:  The patient was identified in the holding area and taken to room 8.  The patient was then placed supine on the table and prepped and draped in the usual sterile fashion.  A time out was called.  Ultrasound was used to evaluate the left common femoral artery.  It was patent .  A digital ultrasound image was acquired.  A micropuncture needle was used to access the left common femoral artery under ultrasound guidance.  An 018 wire was advanced without resistance and a micropuncture sheath was placed.  The 018 wire was removed and a benson wire was placed.  The micropuncture sheath was exchanged for a 5 french sheath.  An omniflush catheter was advanced over the wire to the level of L-1.  An abdominal angiogram was obtained.  Next, using the omniflush catheter and a benson wire, the aortic bifurcation was crossed and the catheter was placed into theright external iliac  artery and right runoff was obtained.  Findings:   Aortogram:  The visualized portions of the suprarenal abdominal aorta showed no hemodynamically significant stenosis. Renal artery stenosis is not visualized. There is calcified shelf within the mid aorta. With the CO2 injections, I cannot classify this as a significant lesion. Bilateral common and external iliac arteries are widely patent.  Right Lower Extremity:  Right common femoral artery is patent throughout it's course. The right profunda femoral artery is widely patent. There is diffuse narrowing within the proximal superficial femoral artery. The vein bypass graft is visualized off of the proximal superficial femoral artery which is widely patent. There is sluggish flow through the bypass graft. There is minimal runoff onto the foot. The bypass graft outflow was primarily through collaterals to the peroneal artery.  Intervention:  No intervention was performed, as there is no outflow to the bypass graft that would be amenable to percutaneous intervention  Impression:  #1  patent right femoral to distal posterior tibial artery bypass graft. The outflow of the bypass graft is mainly through collaterals to the peroneal artery. There appears to be minimal opacification of the distal posterior tibial artery beyond the distal anastomosis.  #2  the graft is not amenable to percutaneous or surgical revascularization    V. Durene Cal, M.D. [facility] [place] Office: [phone] Pager:  [phone]

## 2012-12-25 NOTE — H&P (View-Only) (Signed)
[facility] PAD/PVD Office Note  CC: PAD surveillance Referring Physician: Lawson  History of Present Illness: 79-year-old female patient of Dr. Lawson who is status post a left AKA in 2006 with a right great toe and second digit amputation in October 2002, the patient also has a history of right femoral to distal PTA bypass in October 2012. The patient denies claudication or rest pain in her right lower extremity. The patient denies any open ulcerations on her right lower extremity.  Past Medical History  Diagnosis Date  . CHF (congestive heart failure) 2002    acute CHF while getting lower extremilty arteriograms /  Led to CABG  x6  . Hypertension     very difficult to control  . PVD (peripheral vascular disease)     extensive PVD / left AKA  . Murmur, heart     soft systolic outflow murmur and a grade 2/6 murmur of the aortic insufficiency  . S/P AKA (above knee amputation)     left AKA with prosthesis  . DIABETES MELLITUS, TYPE II   . NEPHROPATHY, DIABETIC   . Coronary artery disease 2002    CABG x6 / left internal mammary artery graft to the LAD, a saphenous vein graft to the diagonal, a sequential saphenous vein graft to the 1st and 2nd obtuse marginal branches, and a sequential saphenous vein graft the the distal right coronary artery and posterior descending  . Anemia   . CKD (chronic kidney disease)   . Advanced age     ROS: [x] Positive   [ ] Denies    General: [ ] Weight loss, [ ] Fever, [ ] chills Neurologic: [ ] Dizziness, [ ] Blackouts, [ ] Seizure [ ] Stroke, [ ] "Mini stroke", [ ] Slurred speech, [ ] Temporary blindness; [ ] weakness in arms or legs, [ ] Hoarseness Cardiac: [ ] Chest pain/pressure, [ ] Shortness of breath at rest [ ] Shortness of breath with exertion, [ ] Atrial fibrillation or irregular heartbeat Vascular: [ ] Pain in legs with walking, [ ] Pain in legs at rest, [ ] Pain in legs at night,  [ ] Non-healing ulcer, [ ]  Blood clot in vein/DVT,   Pulmonary: [ ] Home oxygen, [ ] Productive cough, [ ] Coughing up blood, [ ] Asthma,  [ ] Wheezing Musculoskeletal:  [ ] Arthritis, [ ] Low back pain, [ ] Joint pain Hematologic: [ ] Easy Bruising, [ ] Anemia; [ ] Hepatitis Gastrointestinal: [ ] Blood in stool, [ ] Gastroesophageal Reflux/heartburn, [ ] Trouble swallowing Urinary: [ ] chronic Kidney disease, [ ] on HD - [ ] MWF or [ ] TTHS, [ ] Burning with urination, [ ] Difficulty urinating Skin: [ ] Rashes, [ ] Wounds Psychological: [ ] Anxiety, [ ] Depression   Social History History  Substance Use Topics  . Smoking status: Never Smoker   . Smokeless tobacco: Never Used  . Alcohol Use: No    Family History Family History  Problem Relation Age of Onset  . Coronary artery disease      prevalent in sibling  . Heart disease Mother   . Hypertension Daughter     No Known Allergies  Current Outpatient Prescriptions  Medication Sig Dispense Refill  . amlodipine-atorvastatin (CADUET) 10-10 MG per tablet Take 1 tablet by mouth daily.      . aspirin 81 MG tablet Take 81 mg by mouth daily.        .   atenolol (TENORMIN) 50 MG tablet TAKE ONE TABLET BY MOUTH EVERY DAY  30 tablet  9  . cloNIDine (CATAPRES) 0.3 MG tablet TAKE ONE TABLET BY MOUTH TWICE DAILY  60 tablet  9  . famotidine (PEPCID) 20 MG tablet Take 1 tablet (20 mg total) by mouth 2 (two) times daily.  60 tablet  6  . furosemide (LASIX) 40 MG tablet Take 40 mg by mouth daily.      . hydrALAZINE (APRESOLINE) 100 MG tablet Take 1 tablet (100 mg total) by mouth 3 (three) times daily.  90 tablet  11  . multivitamin (THERAGRAN) per tablet Take 1 tablet by mouth daily.        . potassium chloride SA (KLOR-CON M20) 20 MEQ tablet Take 20 mEq by mouth daily.        Physical Examination  Filed Vitals:   12/18/12 1439  BP: 189/57  Pulse: 69  Resp: 16    Body mass index is 20.25 kg/(m^2).  General:  WDWN in NAD Gait: Normal HEENT: WNL Eyes: Pupils  equal Pulmonary: normal non-labored breathing , without Rales, rhonchi,  wheezing Cardiac: RRR, without  Murmurs, rubs or gallops; No carotid bruits Abdomen: soft, NT, no masses Skin: no rashes, ulcers noted Vascular Exam/Pulses: Palpable femoral pulse on the right with a dampened posterior tibial pulse  Extremities without ischemic changes, no Gangrene , no cellulitis; no open wounds;  Musculoskeletal: no muscle wasting or atrophy  Neurologic: A&O X 3; Appropriate Affect ; SENSATION: normal; MOTOR FUNCTION:  moving all extremities equally. Speech is fluent/normal  Non-Invasive Vascular Imaging: Right ABI today is 0.96 in biphasic to monophasic, duplex graft scan shows a patent graft with elevations of the distal PTA of 483.  ASSESSMENT/PLAN: Dr. Lawson spoke with the patient and explained the need for angiogram of the right lower extremity with runoff and possible PTA. This will be scheduled for 12/25/2012 with Dr. Brabham. The patient's in agreement with this her followup-year-old be pending that procedure. The patient's questions were encouraged and answered.  Kimala Horne ANP  Clinic M.D.: Lawson  

## 2013-01-06 ENCOUNTER — Other Ambulatory Visit: Payer: Self-pay | Admitting: Nurse Practitioner

## 2013-01-07 ENCOUNTER — Encounter: Payer: Self-pay | Admitting: Vascular Surgery

## 2013-01-08 ENCOUNTER — Encounter: Payer: Self-pay | Admitting: Vascular Surgery

## 2013-01-08 ENCOUNTER — Other Ambulatory Visit: Payer: Self-pay | Admitting: *Deleted

## 2013-01-08 ENCOUNTER — Ambulatory Visit (INDEPENDENT_AMBULATORY_CARE_PROVIDER_SITE_OTHER): Payer: Medicare Other | Admitting: Vascular Surgery

## 2013-01-08 VITALS — BP 185/45 | HR 70 | Resp 18 | Ht 62.0 in | Wt 118.0 lb

## 2013-01-08 DIAGNOSIS — I739 Peripheral vascular disease, unspecified: Secondary | ICD-10-CM

## 2013-01-08 NOTE — Progress Notes (Signed)
Subjective:     Patient ID: Adrienne Bowman, female   DOB: Dec 01, 1917, 79 y.o.   MRN: [id number]  HPIthis 79 year old female has a right femoral to distal posterior tibial saphenous vein graft which I inserted in 2002. She had evidence of near occlusion at the distal anastomosis and angiogram was performed a few weeks ago by Dr. Myra Gianotti. This reveals total occlusion of the outflow vessel with patency of the graft through collateral branches. There is no potential for revision. Patient denies rest pain in the right foot. She takes one aspirin per day. She has previously had a left leg amputation.   Past Medical History  Diagnosis Date  . CHF (congestive heart failure) 2002    acute CHF while getting lower extremilty arteriograms /  Led to CABG  x6  . Hypertension     very difficult to control  . PVD (peripheral vascular disease)     extensive PVD / left AKA  . Murmur, heart     soft systolic outflow murmur and a grade 2/6 murmur of the aortic insufficiency  . S/P AKA (above knee amputation)     left AKA with prosthesis  . DIABETES MELLITUS, TYPE II   . NEPHROPATHY, DIABETIC   . Coronary artery disease 2002    CABG x6 / left internal mammary artery graft to the LAD, a saphenous vein graft to the diagonal, a sequential saphenous vein graft to the 1st and 2nd obtuse marginal branches, and a sequential saphenous vein graft the the distal right coronary artery and posterior descending  . Anemia   . CKD (chronic kidney disease)   . Advanced age     History  Substance Use Topics  . Smoking status: Never Smoker   . Smokeless tobacco: Never Used  . Alcohol Use: No    Family History  Problem Relation Age of Onset  . Coronary artery disease      prevalent in sibling  . Heart disease Mother   . Hypertension Daughter     No Known Allergies  Current outpatient prescriptions:amlodipine-atorvastatin (CADUET) 10-10 MG per tablet, Take 1 tablet by mouth daily., Disp: , Rfl: ;  aspirin 81 MG  tablet, Take 81 mg by mouth daily.  , Disp: , Rfl: ;  atenolol (TENORMIN) 50 MG tablet, TAKE ONE TABLET BY MOUTH EVERY DAY, Disp: 30 tablet, Rfl: 9;  cloNIDine (CATAPRES) 0.3 MG tablet, TAKE ONE TABLET BY MOUTH TWICE DAILY, Disp: 60 tablet, Rfl: 9 famotidine (PEPCID) 20 MG tablet, Take 1 tablet (20 mg total) by mouth 2 (two) times daily., Disp: 60 tablet, Rfl: 6;  furosemide (LASIX) 40 MG tablet, Take 40 mg by mouth daily., Disp: , Rfl: ;  furosemide (LASIX) 40 MG tablet, TAKE ONE TABLET BY MOUTH EVERY DAY, Disp: 30 tablet, Rfl: 4;  hydrALAZINE (APRESOLINE) 100 MG tablet, Take 1 tablet (100 mg total) by mouth 3 (three) times daily., Disp: 90 tablet, Rfl: 11 multivitamin (THERAGRAN) per tablet, Take 1 tablet by mouth daily.  , Disp: , Rfl: ;  potassium chloride SA (KLOR-CON M20) 20 MEQ tablet, Take 20 mEq by mouth daily., Disp: , Rfl:   BP 185/45  Pulse 70  Resp 18  Ht 5\' 2"  (1.575 m)  Wt 118 lb (53.524 kg)  BMI 21.58 kg/m2  Body mass index is 21.58 kg/(m^2).           Review of Systems     Objective:   Physical ExamBlood pressure 185/45 heart rate 70 respirations 18 Gen. Elderly  female in no apparent stress alert and oriented x3 Lungs no rhonchi or wheezing Right leg with 3+ graft pulse in the subcutaneous position medially down the posterior tibial level with well healed first and second toe" sites. Left leg with well healed AKA.     Assessment:     Patent right femoral to distal posterior tibial saphenous vein graft-nonrevised double-total occlusion of outflow artery     Plan:     #1 Will maintain patient on daily aspirin because she is high risk for fall with her elderly age and left AKA-not good candidate for Coumadin #2 bypass may well fail  but she is not candidate for thrombectomy or revision since there is no outflow to this vein graft #3 return in 6 months for duplex scan of the graft an ABI

## 2013-01-14 ENCOUNTER — Ambulatory Visit: Payer: Medicare Other | Admitting: Family Medicine

## 2013-01-21 ENCOUNTER — Encounter: Payer: Self-pay | Admitting: Family Medicine

## 2013-01-21 ENCOUNTER — Ambulatory Visit (INDEPENDENT_AMBULATORY_CARE_PROVIDER_SITE_OTHER): Payer: Medicare Other | Admitting: Family Medicine

## 2013-01-21 VITALS — BP 180/52 | HR 72 | Temp 97.4°F | Wt 116.0 lb

## 2013-01-21 DIAGNOSIS — N189 Chronic kidney disease, unspecified: Secondary | ICD-10-CM

## 2013-01-21 NOTE — Patient Instructions (Addendum)
Good to see you. We will call you with your lab results.   

## 2013-01-21 NOTE — Progress Notes (Signed)
Very pleasant 79 yo AAF here with her daughter, with h/o J CAD with prior CABG, HTN, DM, PVD with left AKA and prior angioplasty of the right femoral posterior tibial bypass here for follow up.    She says she is doing well. Has no complaint.   HTN- BP elevated again today.  BP has been this elevated in vascular office and cardiology last two months- notes reviewed.  She is asymptomac and often does not take her medications.    DM- not currently taking any diabetes medications.  She thinks her sugars "are up" but cannot explain why.  Does not check CBGs at home.  She has been a little more fatigued but she states "I am 89!"   Lab Results  Component Value Date   HGBA1C 6.7* 01/28/2010   Also saw Dr. [name] recently from renal.  Current Outpatient Prescriptions on File Prior to Visit  Medication Sig Dispense Refill  . amlodipine-atorvastatin (CADUET) 10-10 MG per tablet Take 1 tablet by mouth daily.      [name] aspirin 81 MG tablet Take 81 mg by mouth daily.        [name] atenolol (TENORMIN) 50 MG tablet TAKE ONE TABLET BY MOUTH EVERY DAY  30 tablet  9  . cloNIDine (CATAPRES) 0.3 MG tablet TAKE ONE TABLET BY MOUTH TWICE DAILY  60 tablet  9  . famotidine (PEPCID) 20 MG tablet Take 1 tablet (20 mg total) by mouth 2 (two) times daily.  60 tablet  6  . furosemide (LASIX) 40 MG tablet Take 40 mg by mouth daily.      . furosemide (LASIX) 40 MG tablet TAKE ONE TABLET BY MOUTH EVERY DAY  30 tablet  4  . hydrALAZINE (APRESOLINE) 100 MG tablet Take 1 tablet (100 mg total) by mouth 3 (three) times daily.  90 tablet  11  . multivitamin (THERAGRAN) per tablet Take 1 tablet by mouth daily.        . potassium chloride SA (KLOR-CON M20) 20 MEQ tablet Take 20 mEq by mouth daily.       No current facility-administered medications on file prior to visit.    No Known Allergies  Past Medical History  Diagnosis Date  . CHF (congestive heart failure) 2002    acute CHF while getting lower extremilty arteriograms /   Led to CABG  x6  . Hypertension     very difficult to control  . PVD (peripheral vascular disease)     extensive PVD / left AKA  . Murmur, heart     soft systolic outflow murmur and a grade 2/6 murmur of the aortic insufficiency  . S/P AKA (above knee amputation)     left AKA with prosthesis  . DIABETES MELLITUS, TYPE II   . NEPHROPATHY, DIABETIC   . Coronary artery disease 2002    CABG x6 / left internal mammary artery graft to the LAD, a saphenous vein graft to the diagonal, a sequential saphenous vein graft to the 1st and 2nd obtuse marginal branches, and a sequential saphenous vein graft the the distal right coronary artery and posterior descending  . Anemia   . CKD (chronic kidney disease)   . Advanced age     Past Surgical History  Procedure Laterality Date  . Coronary artery bypass graft  2002    x6 per Dr. Laneta Simmers  . Eye surgery  1987 & 1988     left eye  . Vascular surgery      multiple peripheral  vascular surgeries  . Left aka  2006  . Right renal artery stent  2004  . Rfpbpg  2002  . Pr vein bypass graft,aorto-fem-pop  Oct. 17, 2012    Right  Fem-Distal post. Tibial artery BPG.  . Lower extremity angiogram  February 21, 2012    History  Smoking status  . Never Smoker   Smokeless tobacco  . Never Used    History  Alcohol Use No    Family History  Problem Relation Age of Onset  . Coronary artery disease      prevalent in sibling  . Heart disease Mother   . Hypertension Daughter     Review of Systems: The review of systems is per the HPI.    Physical Exam: BP 180/52  Pulse 72  Temp(Src) 97.4 F (36.3 C)  Wt 116 lb (52.617 kg)  BMI 21.21 kg/m2  Gen:  Alert, very pleasant as always, NAD.  HEENT is unremarkable. Normocephalic/atraumatic. PERRL. Sclera are nonicteric. Neck is supple. No masses. No JVD. Lungs are clear. Cardiac exam shows a regular rate and rhythm. Abdomen is soft. Extremities are without edema. Left AKA noted. Gait and ROM are intact.  She is using a walker and ambulates fairly well. No gross neurologic deficits noted.   LABORATORY DATA:  Lab Results  Component Value Date   WBC 4.9 04/17/2012   HGB 11.9* 12/25/2012   HCT 35.0* 12/25/2012   PLT 249 04/17/2012   GLUCOSE 209* 12/25/2012   CHOL 131 08/16/2012   TRIG 230.0* 08/16/2012   HDL 39.80 08/16/2012   LDLDIRECT 59.6 08/16/2012   LDLCALC 42 05/30/2011   ALT 17 08/16/2012   AST 22 08/16/2012   NA 140 12/25/2012   K 3.8 12/25/2012   CL 104 12/25/2012   CREATININE 2.20* 12/25/2012   BUN 60* 12/25/2012   CO2 30 08/16/2012   HGBA1C 6.7* 01/28/2010   Assessment / Plan:  1. HYPERTENSION Deteriorated but has been this high.  Hydralazine was recently increased and she refuses any further medication adjustments today. - Comprehensive metabolic panel  2. CHRONIC KIDNEY DISEASE UNSPECIFIED Followed by renal, Dr. [name]. - Comprehensive metabolic panel  3. DIABETES MELLITUS, TYPE II No rx.  Recheck a1c today. - Hemoglobin A1c

## 2013-01-22 LAB — HEMOGLOBIN A1C: Hgb A1c MFr Bld: 7.7 % — ABNORMAL HIGH (ref 4.6–6.5)

## 2013-01-22 LAB — COMPREHENSIVE METABOLIC PANEL
ALT: 17 U/L (ref 0–35)
AST: 26 U/L (ref 0–37)
Calcium: 8.8 mg/dL (ref 8.4–10.5)
Chloride: 95 mEq/L — ABNORMAL LOW (ref 96–112)
Creatinine, Ser: 2.2 mg/dL — ABNORMAL HIGH (ref 0.4–1.2)
Sodium: 129 mEq/L — ABNORMAL LOW (ref 135–145)
Total Protein: 6.3 g/dL (ref 6.0–8.3)

## 2013-01-23 ENCOUNTER — Telehealth: Payer: Self-pay | Admitting: *Deleted

## 2013-01-23 NOTE — Telephone Encounter (Signed)
I had spoken with patient regarding her lab results.  I tried to call her daughter at [phone], a number that I found in a previous phone note.  Pt's grandson then called me back saying that he has POA and pt is not to make any of her own health decisions because she is incompetent.  He requested an appt to come in and discuss some issues with you.  appt made for next week.  I faxed a copy of her lab results to him for his review, and to decide whether he wants pt on medicine or diet.

## 2013-01-24 ENCOUNTER — Telehealth: Payer: Self-pay | Admitting: Family Medicine

## 2013-01-24 NOTE — Telephone Encounter (Signed)
Patient Information:  Caller Name: Adrienne Bowman  Phone: [phone]  Patient: Adrienne Bowman, Adrienne Bowman  Gender: Female  DOB: 01-07-1918  Age: 79 Years  PCP: Ruthe Mannan [facility])  Office Follow Up:  Does the office need to follow up with this patient?: No  Instructions For The Office: N/A   Symptoms  Reason For Call & Symptoms: Daughter Adrienne Bowman calling about medication that was supposed to have been ordered at her appointment on 01/21/13.  Per EPIC notes no new medications were ordered.  Advised that she has an appt on 01/29/13 with her Elise Benne to discuss care with Dr. Dayton Martes.  Adrienne Bowman states patient has no power of attorney and her sister is her guardian.  No further needs at this time.  Reviewed Health History In EMR: Yes  Reviewed Medications In EMR: Yes  Reviewed Allergies In EMR: Yes  Reviewed Surgeries / Procedures: Yes  Date of Onset of Symptoms: Unknown  Guideline(s) Used:  No Protocol Available - Information Only  Disposition Per Guideline:   Home Care  Reason For Disposition Reached:   Information only question and nurse able to answer  Advice Given:  Call Back If:  New symptoms develop  Patient Will Follow Care Advice:  YES

## 2013-01-25 MED ORDER — GLYBURIDE 1.25 MG PO TABS: 1.2500 mg | ORAL_TABLET | Freq: Every day | ORAL | Status: DC

## 2013-01-25 NOTE — Telephone Encounter (Signed)
Advised patient's grandson that Dr. Dayton Martes will not be able to discuss pt's medical care with him, since she finds the patient competent.  Advised him to discuss this situation with his family.

## 2013-01-25 NOTE — Telephone Encounter (Signed)
Since I find the patient competent, I will go ahead and start glyburide for her blood sugar.  Pt states that he was her HPOA in 2007 but she does not want him to be her HPOA.  I advised her to clarify this. Please have Karie Kirks discuss this with her grandson.

## 2013-01-25 NOTE — Telephone Encounter (Signed)
Adrienne Bowman, please figure out who is POA.  I cannot meet with him about meds if he is truly not POA as stating in this note.  Please have Deondra and her daughter come see me during the appointment scheduled.

## 2013-01-25 NOTE — Telephone Encounter (Signed)
Copy of POA paperwork found in chart.  Placed on your desk for review.  Pt and her daughter want medicine called in for pt's blood sugar, but grandson wants to wait and discuss this with you next week.  He is faxing over some questions he has, for your review.

## 2013-01-29 ENCOUNTER — Ambulatory Visit: Payer: Medicare Other | Admitting: Family Medicine

## 2013-03-11 ENCOUNTER — Other Ambulatory Visit: Payer: Self-pay | Admitting: Nurse Practitioner

## 2013-03-15 ENCOUNTER — Telehealth: Payer: Self-pay | Admitting: Family Medicine

## 2013-03-15 NOTE — Telephone Encounter (Signed)
Advised patient's daughter, appt scheduled for 5/5.

## 2013-03-15 NOTE — Telephone Encounter (Signed)
Patients attorney would like a statement stating that patient is getting regular care.  Can you also include how you feel about patients mental state.

## 2013-03-15 NOTE — Telephone Encounter (Signed)
It is a privacy violation to write something like this without knowledge of who is asking for this.  Please get more information, preferably, an office visit with her HPOA.

## 2013-03-18 ENCOUNTER — Ambulatory Visit (INDEPENDENT_AMBULATORY_CARE_PROVIDER_SITE_OTHER): Payer: Medicare Other | Admitting: Family Medicine

## 2013-03-18 ENCOUNTER — Encounter: Payer: Self-pay | Admitting: Family Medicine

## 2013-03-18 VITALS — BP 160/40 | HR 76 | Temp 97.5°F | Wt 101.0 lb

## 2013-03-18 DIAGNOSIS — Z7409 Other reduced mobility: Secondary | ICD-10-CM

## 2013-03-18 DIAGNOSIS — I739 Peripheral vascular disease, unspecified: Secondary | ICD-10-CM

## 2013-03-18 DIAGNOSIS — N189 Chronic kidney disease, unspecified: Secondary | ICD-10-CM

## 2013-03-18 DIAGNOSIS — E119 Type 2 diabetes mellitus without complications: Secondary | ICD-10-CM

## 2013-03-18 DIAGNOSIS — R69 Illness, unspecified: Secondary | ICD-10-CM

## 2013-03-18 DIAGNOSIS — Z719 Counseling, unspecified: Secondary | ICD-10-CM

## 2013-03-18 DIAGNOSIS — IMO0001 Reserved for inherently not codable concepts without codable children: Secondary | ICD-10-CM

## 2013-03-18 NOTE — Progress Notes (Signed)
Very pleasant 79 yo AAF here with her daughter, her guardian, with h/o J CAD with prior CABG, HTN, DM, PVD with left AKA and prior angioplasty of the right femoral posterior tibial bypass here for consultation.  They are requesting "letter for attorney."   Current Outpatient Prescriptions on File Prior to Visit  Medication Sig Dispense Refill  . amlodipine-atorvastatin (CADUET) 10-10 MG per tablet Take 1 tablet by mouth daily.      [name] amlodipine-atorvastatin (CADUET) 10-10 MG per tablet TAKE ONE TABLET BY MOUTH EVERY DAY  30 tablet  6  . aspirin 81 MG tablet Take 81 mg by mouth daily.        [name] atenolol (TENORMIN) 50 MG tablet TAKE ONE TABLET BY MOUTH EVERY DAY  30 tablet  9  . cloNIDine (CATAPRES) 0.3 MG tablet TAKE ONE TABLET BY MOUTH TWICE DAILY  60 tablet  9  . famotidine (PEPCID) 20 MG tablet Take 1 tablet (20 mg total) by mouth 2 (two) times daily.  60 tablet  6  . furosemide (LASIX) 40 MG tablet Take 40 mg by mouth daily.      . furosemide (LASIX) 40 MG tablet TAKE ONE TABLET BY MOUTH EVERY DAY  30 tablet  4  . glyBURIDE (DIABETA) 1.25 MG tablet Take 1 tablet (1.25 mg total) by mouth daily with breakfast.  30 tablet  3  . hydrALAZINE (APRESOLINE) 100 MG tablet Take 1 tablet (100 mg total) by mouth 3 (three) times daily.  90 tablet  11  . multivitamin (THERAGRAN) per tablet Take 1 tablet by mouth daily.        . potassium chloride SA (KLOR-CON M20) 20 MEQ tablet Take 20 mEq by mouth daily.       No current facility-administered medications on file prior to visit.    No Known Allergies  Past Medical History  Diagnosis Date  . CHF (congestive heart failure) 2002    acute CHF while getting lower extremilty arteriograms /  Led to CABG  x6  . Hypertension     very difficult to control  . PVD (peripheral vascular disease)     extensive PVD / left AKA  . Murmur, heart     soft systolic outflow murmur and a grade 2/6 murmur of the aortic insufficiency  . S/P AKA (above knee amputation)      left AKA with prosthesis  . DIABETES MELLITUS, TYPE II   . NEPHROPATHY, DIABETIC   . Coronary artery disease 2002    CABG x6 / left internal mammary artery graft to the LAD, a saphenous vein graft to the diagonal, a sequential saphenous vein graft to the 1st and 2nd obtuse marginal branches, and a sequential saphenous vein graft the the distal right coronary artery and posterior descending  . Anemia   . CKD (chronic kidney disease)   . Advanced age     Past Surgical History  Procedure Laterality Date  . Coronary artery bypass graft  2002    x6 per Dr. Laneta Simmers  . Eye surgery  1987 & 1988     left eye  . Vascular surgery      multiple peripheral vascular surgeries  . Left aka  2006  . Right renal artery stent  2004  . Rfpbpg  2002  . Pr vein bypass graft,aorto-fem-pop  Oct. 17, 2012    Right  Fem-Distal post. Tibial artery BPG.  . Lower extremity angiogram  February 21, 2012    History  Smoking status  .  Never Smoker   Smokeless tobacco  . Never Used    History  Alcohol Use No    Family History  Problem Relation Age of Onset  . Coronary artery disease      prevalent in sibling  . Heart disease Mother   . Hypertension Daughter     Review of Systems: The review of systems is per the HPI.    Physical Exam: BP 160/40  Pulse 76  Temp(Src) 97.5 F (36.4 C)  Wt 101 lb (45.813 kg)  BMI 18.47 kg/m2  Gen:  Alert, very pleasant as always, NAD.  HEENT is unremarkable. Normocephalic/atraumatic. PERRL. Sclera are nonicteric. Neck is supple. No masses. No JVD. Lungs are clear. Cardiac exam shows a regular rate and rhythm. Abdomen is soft. Extremities are without edema. Left AKA noted. Gait and ROM are intact. She is using a walker and ambulates fairly well. No gross neurologic deficits noted.   Assessment / Plan:  1. Encounter for consultation >25 min spent with face to face with patient, >50% counseling and/or coordinating care Ms. Chipps is certainly competent.  Her  grandson has been trying to take over her estate due to incompetence.  I agree with her legal guardian, that she is competent.  Letter written as requested and returned to pt and her daughter.

## 2013-04-02 ENCOUNTER — Ambulatory Visit: Payer: Medicare Other | Admitting: Vascular Surgery

## 2013-04-17 ENCOUNTER — Telehealth: Payer: Self-pay | Admitting: *Deleted

## 2013-04-17 ENCOUNTER — Ambulatory Visit (INDEPENDENT_AMBULATORY_CARE_PROVIDER_SITE_OTHER): Payer: Medicare Other | Admitting: Physician Assistant

## 2013-04-17 ENCOUNTER — Encounter: Payer: Self-pay | Admitting: Physician Assistant

## 2013-04-17 ENCOUNTER — Ambulatory Visit: Payer: Medicare Other | Admitting: Nurse Practitioner

## 2013-04-17 VITALS — BP 202/68 | HR 81 | Ht 62.0 in | Wt 115.1 lb

## 2013-04-17 DIAGNOSIS — E785 Hyperlipidemia, unspecified: Secondary | ICD-10-CM

## 2013-04-17 DIAGNOSIS — I2510 Atherosclerotic heart disease of native coronary artery without angina pectoris: Secondary | ICD-10-CM

## 2013-04-17 DIAGNOSIS — I10 Essential (primary) hypertension: Secondary | ICD-10-CM

## 2013-04-17 DIAGNOSIS — N189 Chronic kidney disease, unspecified: Secondary | ICD-10-CM

## 2013-04-17 DIAGNOSIS — I739 Peripheral vascular disease, unspecified: Secondary | ICD-10-CM

## 2013-04-17 DIAGNOSIS — I509 Heart failure, unspecified: Secondary | ICD-10-CM

## 2013-04-17 LAB — BASIC METABOLIC PANEL
BUN: 64 mg/dL — ABNORMAL HIGH (ref 6–23)
CO2: 27 mEq/L (ref 19–32)
Calcium: 9.9 mg/dL (ref 8.4–10.5)
Chloride: 100 mEq/L (ref 96–112)
Creatinine, Ser: 2.5 mg/dL — ABNORMAL HIGH (ref 0.4–1.2)

## 2013-04-17 MED ORDER — AMLODIPINE-ATORVASTATIN 10-10 MG PO TABS: 1.0000 | ORAL_TABLET | Freq: Every day | ORAL | Status: DC

## 2013-04-17 NOTE — Progress Notes (Signed)
[address] [place], [place]  [address] Phone: [phone] Fax:  [phone]  Date:  04/17/2013   ID:  Adrienne Bowman, DOB December 31, 1917, MRN [id number]  PCP:  Ruthe Mannan, MD  Cardiologist:  Dr. Rollene Rotunda     History of Present Illness: Adrienne Bowman is a 79 y.o. female who returns for f/u.  She has a history of CAD, status post CABG in 2002 (LIMA-LAD, SVG-OM1/OM 2, SVG-diagonal, SVG-PDA/distal RCA), PAD, status post left AKA, status post prior RLE revascularization (followed by [facility]), DM2, HTN, CKD. EF 45-50% at cardiac catheterization 2002.  Last seen by Norma Fredrickson, NP 12/2012. Hydralazine was adjusted for blood pressure control.  Since last seen, she has run out of her Caduet.  She states that our office has not refilled it.  We contacted the pharmacy and they were out of the drug and just got it stocked this week.  She denies chest pain, dyspnea, syncope, orthopnea, PND.  Labs (10/13):  LDL 59.6 Labs (2/14):    Hgb 11.9 Labs (3/14):    Na 129, K 3.8, Cr 2.2, ALT 17  Wt Readings from Last 3 Encounters:  04/17/13 115 lb 1.9 oz (52.218 kg)  03/18/13 101 lb (45.813 kg)  01/21/13 116 lb (52.617 kg)     Past Medical History  Diagnosis Date  . CHF (congestive heart failure) 2002    acute CHF while getting lower extremilty arteriograms /  Led to CABG  x6  . Hypertension     very difficult to control  . PVD (peripheral vascular disease)     extensive PVD / left AKA  . Murmur, heart     soft systolic outflow murmur and a grade 2/6 murmur of the aortic insufficiency  . S/P AKA (above knee amputation)     left AKA with prosthesis  . DIABETES MELLITUS, TYPE II   . NEPHROPATHY, DIABETIC   . Coronary artery disease 2002    CABG x6 / left internal mammary artery graft to the LAD, a saphenous vein graft to the diagonal, a sequential saphenous vein graft to the 1st and 2nd obtuse marginal branches, and a sequential saphenous vein graft the the distal right  coronary artery and posterior descending  . Anemia   . CKD (chronic kidney disease)   . Advanced age     Current Outpatient Prescriptions  Medication Sig Dispense Refill  . aspirin 81 MG tablet Take 81 mg by mouth daily.        [name] atenolol (TENORMIN) 50 MG tablet TAKE ONE TABLET BY MOUTH EVERY DAY  30 tablet  9  . cloNIDine (CATAPRES) 0.3 MG tablet TAKE ONE TABLET BY MOUTH TWICE DAILY  60 tablet  9  . famotidine (PEPCID) 20 MG tablet Take 1 tablet (20 mg total) by mouth 2 (two) times daily.  60 tablet  6  . furosemide (LASIX) 40 MG tablet TAKE ONE TABLET BY MOUTH EVERY DAY  30 tablet  4  . glyBURIDE (DIABETA) 1.25 MG tablet Take 1 tablet (1.25 mg total) by mouth daily with breakfast.  30 tablet  3  . hydrALAZINE (APRESOLINE) 100 MG tablet Take 1 tablet (100 mg total) by mouth 3 (three) times daily.  90 tablet  11  . multivitamin (THERAGRAN) per tablet Take 1 tablet by mouth daily.        [name] amlodipine-atorvastatin (CADUET) 10-10 MG per tablet TAKE ONE TABLET BY MOUTH EVERY DAY  30 tablet  6  . potassium chloride  SA (KLOR-CON M20) 20 MEQ tablet Take 20 mEq by mouth daily.       No current facility-administered medications for this visit.    Allergies:   No Known Allergies  Social History:  The patient  reports that she has never smoked. She has never used smokeless tobacco. She reports that she does not drink alcohol or use illicit drugs.   ROS:  Please see the history of present illness.      All other systems reviewed and negative.   PHYSICAL EXAM: VS:  BP 202/68  Pulse 81  Ht 5\' 2"  (1.575 m)  Wt 115 lb 1.9 oz (52.218 kg)  BMI 21.05 kg/m2 Well nourished, well developed, in no acute distress HEENT: normal Neck: no JVD at 90 degrees Cardiac:  normal S1, S2; RRR; no murmur Lungs:  clear to auscultation bilaterally, no wheezing, rhonchi or rales Abd: soft, nontender, no hepatomegaly Ext: no edema Skin: warm and dry Neuro:  CNs 2-12 intact, no focal abnormalities noted  EKG:   NSR, HR 81, septal Q waves, inf-lat TWI, no significant change since last tracing     ASSESSMENT AND PLAN:  1. Hypertension:  Uncontrolled.  This has been a persistent problem.  She is out of Caduet.  I will make sure this is refilled for her and she will restart it today.  Given her CKD and known vascular disease, it may be worthwhile to obtain renal artery dopplers.  This can be considered if her BP remains resistant.  2. CAD:  No angina.  Continue ASA and statin. 3. Hyperlipidemia:  Continue statin. 4. PAD:  F/u with [facility] as planned.  5. CKD:  Check BMET today.  F/u with Dr. [name] as planned.  6. Disposition:  F/u with Norma Fredrickson, NP in 3 mos.   Signed, Tereso Newcomer, PA-C  04/17/2013 10:50 AM

## 2013-04-17 NOTE — Telephone Encounter (Signed)
Message copied by Tarri Fuller on Wed Apr 17, 2013  5:51 PM ------      Message from: [place], [place] T      Created: Wed Apr 17, 2013  5:03 PM       K+ low      Creatinine stable      She has been having trouble with K+ tabs causing nausea      Have her increase dietary K+ (eat a banana 3-4 x per week)      Repeat BMET in 1 week      Tereso Newcomer, PA-C        04/17/2013 5:03 PM ------

## 2013-04-17 NOTE — Patient Instructions (Addendum)
A REFILL FOR CADUET HAS BEEN SENT IN TO [facility] IN [place] TODAY  PLEASE FOLLOW UP WITH Norma Fredrickson, NP IN 3 MONTHS  STOP TAKING POTASSIUM  Lab today bmet

## 2013-04-17 NOTE — Telephone Encounter (Signed)
pt notified about lab results and that she could hold the K+ tab due to nausea, and ok per [employer]. PAC to increase dietary K+ (banana's 3-4 times weekly) repeat bmet 04/26/13 pt verbalized understanding today to all instructions

## 2013-04-26 ENCOUNTER — Encounter (INDEPENDENT_AMBULATORY_CARE_PROVIDER_SITE_OTHER): Payer: Self-pay | Admitting: Physician Assistant

## 2013-04-26 DIAGNOSIS — I509 Heart failure, unspecified: Secondary | ICD-10-CM

## 2013-04-26 DIAGNOSIS — R0989 Other specified symptoms and signs involving the circulatory and respiratory systems: Secondary | ICD-10-CM

## 2013-04-27 ENCOUNTER — Other Ambulatory Visit: Payer: Self-pay | Admitting: Physician Assistant

## 2013-04-27 LAB — BASIC METABOLIC PANEL
BUN: 57 mg/dL — ABNORMAL HIGH (ref 6–23)
Calcium: 9.6 mg/dL (ref 8.4–10.5)
Glucose, Bld: 154 mg/dL — ABNORMAL HIGH (ref 70–99)
Sodium: 137 mEq/L (ref 135–145)

## 2013-04-29 ENCOUNTER — Telehealth: Payer: Self-pay | Admitting: *Deleted

## 2013-04-29 NOTE — Telephone Encounter (Signed)
pt notified about lab results with verbal understanding  

## 2013-04-29 NOTE — Telephone Encounter (Signed)
Message copied by Tarri Fuller on Mon Apr 29, 2013  8:35 AM ------      Message from: [place], [place] T      Created: Sun Apr 28, 2013  4:40 PM       Renal function stable.      Continue with current treatment plan.      Tereso Newcomer, PA-C        04/28/2013 4:40 PM ------

## 2013-05-01 ENCOUNTER — Encounter: Payer: Self-pay | Admitting: Family Medicine

## 2013-05-14 NOTE — Progress Notes (Signed)
This encounter was created in error - please disregard.

## 2013-06-11 ENCOUNTER — Other Ambulatory Visit: Payer: Self-pay | Admitting: Nurse Practitioner

## 2013-07-08 ENCOUNTER — Encounter: Payer: Self-pay | Admitting: Family

## 2013-07-09 ENCOUNTER — Encounter: Payer: Self-pay | Admitting: Family

## 2013-07-09 ENCOUNTER — Ambulatory Visit (INDEPENDENT_AMBULATORY_CARE_PROVIDER_SITE_OTHER): Payer: Medicare Other | Admitting: Family

## 2013-07-09 ENCOUNTER — Encounter (INDEPENDENT_AMBULATORY_CARE_PROVIDER_SITE_OTHER): Payer: Medicare Other | Admitting: *Deleted

## 2013-07-09 VITALS — BP 180/65 | HR 68 | Resp 16 | Ht 64.0 in | Wt 119.0 lb

## 2013-07-09 DIAGNOSIS — Z48812 Encounter for surgical aftercare following surgery on the circulatory system: Secondary | ICD-10-CM

## 2013-07-09 DIAGNOSIS — I739 Peripheral vascular disease, unspecified: Secondary | ICD-10-CM

## 2013-07-09 NOTE — Progress Notes (Signed)
[facility] HISTORY AND PHYSICAL -PAD  History of Present Illness Adrienne Bowman is a 79 y.o. female had a right femoral to distal posterior tibial saphenous vein graft in 2012 by Dr. Hart Rochester. She had evidence of near occlusion at the distal anastomosis and angiogram was performed by Dr. Myra Gianotti on 12/25/12. This revealed total occlusion of the outflow vessel with patency of the graft through collateral branches. There was no potential for revision. She had a previous left AKA (2006). Right great toe and second toe amputations 2002.  Pt. denies claudication at  RLE.[name]  Pt. denies rest pain; denies night pain denies non healing ulcers on right lower extremity.  The patient denies New Medical or Surgical History.   Pt Diabetic: Yes Pt smoker: non-smoker  Pt meds include: Statin :Yes Betablocker: Yes ASA: Yes Other anticoagulants/antiplatelets: no  Past Medical History  Diagnosis Date  . CHF (congestive heart failure) 2002    acute CHF while getting lower extremilty arteriograms /  Led to CABG  x6  . Hypertension     very difficult to control  . PVD (peripheral vascular disease)     extensive PVD / left AKA  . Murmur, heart     soft systolic outflow murmur and a grade 2/6 murmur of the aortic insufficiency  . S/P AKA (above knee amputation)     left AKA with prosthesis  . DIABETES MELLITUS, TYPE II   . NEPHROPATHY, DIABETIC   . Coronary artery disease 2002    CABG x6 / left internal mammary artery graft to the LAD, a saphenous vein graft to the diagonal, a sequential saphenous vein graft to the 1st and 2nd obtuse marginal branches, and a sequential saphenous vein graft the the distal right coronary artery and posterior descending  . Anemia   . CKD (chronic kidney disease)   . Advanced age     Social History History  Substance Use Topics  . Smoking status: Never Smoker   . Smokeless tobacco: Never Used  . Alcohol Use: No    Family  History Family History  Problem Relation Age of Onset  . Coronary artery disease      prevalent in sibling  . Heart disease Mother   . Hypertension Daughter     Past Surgical History  Procedure Laterality Date  . Coronary artery bypass graft  2002    x6 per Dr. Laneta Simmers  . Eye surgery  1987 & 1988     left eye  . Vascular surgery      multiple peripheral vascular surgeries  . Left aka  2006  . Right renal artery stent  2004  . Rfpbpg  2002  . Lower extremity angiogram  February 21, 2012  . Pr vein bypass graft,aorto-fem-pop  Oct. 17, 2012    Right  Fem-Distal post. Tibial artery BPG.    No Known Allergies  Current Outpatient Prescriptions  Medication Sig Dispense Refill  . amlodipine-atorvastatin (CADUET) 10-10 MG per tablet Take 1 tablet by mouth daily.  30 tablet  11  . aspirin 81 MG tablet Take 81 mg by mouth daily.        [name] atenolol (TENORMIN) 50 MG tablet TAKE ONE TABLET BY MOUTH EVERY DAY  30 tablet  9  . cloNIDine (CATAPRES) 0.3 MG tablet TAKE ONE TABLET BY MOUTH TWICE DAILY  60 tablet  9  . famotidine (PEPCID) 20 MG tablet TAKE ONE TABLET BY MOUTH TWICE DAILY  60 tablet  0  .  furosemide (LASIX) 40 MG tablet TAKE ONE TABLET BY MOUTH ONCE DAILY  30 tablet  3  . glyBURIDE (DIABETA) 1.25 MG tablet Take 1 tablet (1.25 mg total) by mouth daily with breakfast.  30 tablet  3  . hydrALAZINE (APRESOLINE) 100 MG tablet Take 1 tablet (100 mg total) by mouth 3 (three) times daily.  90 tablet  11  . multivitamin (THERAGRAN) per tablet Take 1 tablet by mouth daily.         No current facility-administered medications for this visit.    ROS: [x]  Positive   [ ]  Denies  General:[ ]  Weight loss,  [ ]  Weight gain, [ ]  Fever, [ ]  chills Neurologic: [ ]  Dizziness, [ ]  Blackouts, [ ]  Seizure [ ]  Stroke, [ ]  "Mini stroke", [ ]  Slurred speech, [ ]  Temporary blindness;  [ ] weakness, [ ]  Hoarseness Cardiac: [ ]  Chest pain/pressure, [ ]  Shortness of breath at rest [ ]  Shortness of breath with  exertion,  [ ]   Atrial fibrillation or irregular heartbeat Vascular:[ ]  Pain in legs with walking, [ ]  Pain in legs at rest ,[ ]  Pain in legs at night,  [ ]   Non-healing ulcer, [ ]  Blood clot in vein/DVT,   Pulmonary: [ ]  Home oxygen, [ ]   Productive cough, [ ]  Coughing up blood,  [ ]  Asthma,  [ ]  Wheezing Musculoskeletal:  [ ]  Arthritis, [ ]  Low back pain,  [ ]  Joint pain Hematologic:[ ]  Easy Bruising, [ ]  Anemia; [ ]  Hepatitis Gastrointestinal: [ ]  Blood in stool,  [ ]  Gastroesophageal Reflux, [ ]  Trouble swallowing Urinary: [name] ] chronic Kidney disease, dR. fOX [ ]  on HD, [ ]  Burning with urination, [ ]  Frequent urination, [ ]  Difficulty urinating;  Skin: [ ]  Rashes, [ ]  Wounds     Physical Examination  Filed Vitals:   07/09/13 1218  BP: 180/65  Pulse: 68  Resp: 16   Filed Weights   07/09/13 1218  Weight: 119 lb (53.978 kg)   Body mass index is 20.42 kg/(m^2).   General: A&O x 3, WDWN,  Gait: with walker, slow and deliberate. Eyes: PERRLA, Pulmonary: CTAB, without wheezes , rales or rhonchi Cardiac: regular Rythm , with murmur          Carotid Bruits Left Right   Positive Positive                             VASCULAR EXAM: Extremities without ischemic changes, left AKA, wearing left LE prosthesis without Gangrene; without open wounds.                                                                                                          LE Pulses LEFT RIGHT       FEMORAL  not palpable   palpable        POPLITEAL AKA  not palpable       POSTERIOR TIBIAL  AKA   palpable        DORSALIS  PEDIS      ANTERIOR TIBIAL AKA  not palpable    Abdomen: soft, NT, no masses Skin: no rashes, ulcers noted Musculoskeletal: no muscle wasting or atrophy. Right great toe and second toe amputated. Left AKA with prosthesis in place.  Neurologic: A&O X 3; Appropriate Affect ; SENSATION: normal; MOTOR FUNCTION:  moving all extremities equally. Speech is  fluent/normal    Non-Invasive Vascular Imaging: DATE: 07/09/2013 ABI: RIGHT 1.00, Waveforms: PT biphasic, DP monophasic and appears to have medial calcification, unable to obtain TBI due to toes amputation;  LEFT AKA. Prior right ABI (12/18/12): 0.96. DUPLEX SCAN OF BYPASS, RLE: Patent graft. The distal anastomosis appears patent, however velocity is in the >70% range, however there is a diameter change with vessel tortuosity with possible collaterals formation.   ASSESSMENT: KIESHA ENSEY is a 79 y.o. female who presents with: asymptomatic right lower extremity PAD WITH normal ABI, patent RLE graft with >70% velocity in the distal anastomosis, vessel tortuosity, and possible collateral circulation formation.   PLAN: After discussing with Dr. Hart Rochester and based on the patient's vascular studies and examination, pt will return to clinic in 6  months for repeat ABI and Duplex RLE, and bilateral carotid Doppler; indication bilateral carotid bruits.  Charisse March, RN, MSN, FNP-C Office Phone: [phone]  Clinic MD: Hart Rochester  07/09/2013 1:05 PM

## 2013-07-10 ENCOUNTER — Encounter: Payer: Self-pay | Admitting: Family

## 2013-07-11 ENCOUNTER — Other Ambulatory Visit: Payer: Self-pay | Admitting: Nurse Practitioner

## 2013-07-19 ENCOUNTER — Ambulatory Visit (INDEPENDENT_AMBULATORY_CARE_PROVIDER_SITE_OTHER): Payer: Medicare Other | Admitting: Nurse Practitioner

## 2013-07-19 ENCOUNTER — Encounter: Payer: Self-pay | Admitting: Nurse Practitioner

## 2013-07-19 VITALS — BP 190/58 | HR 56 | Ht 64.0 in | Wt 117.8 lb

## 2013-07-19 DIAGNOSIS — E785 Hyperlipidemia, unspecified: Secondary | ICD-10-CM

## 2013-07-19 DIAGNOSIS — I10 Essential (primary) hypertension: Secondary | ICD-10-CM

## 2013-07-19 LAB — HEPATIC FUNCTION PANEL
ALT: 15 U/L (ref 0–35)
AST: 24 U/L (ref 0–37)
Albumin: 3.6 g/dL (ref 3.5–5.2)
Alkaline Phosphatase: 48 U/L (ref 39–117)
Bilirubin, Direct: 0.1 mg/dL (ref 0.0–0.3)
Total Bilirubin: 0.6 mg/dL (ref 0.3–1.2)
Total Protein: 6.8 g/dL (ref 6.0–8.3)

## 2013-07-19 LAB — BASIC METABOLIC PANEL
BUN: 49 mg/dL — ABNORMAL HIGH (ref 6–23)
CO2: 30 mEq/L (ref 19–32)
Calcium: 9.1 mg/dL (ref 8.4–10.5)
Chloride: 100 mEq/L (ref 96–112)
Creatinine, Ser: 2.3 mg/dL — ABNORMAL HIGH (ref 0.4–1.2)
GFR: 25.2 mL/min — ABNORMAL LOW (ref >60.00)
Glucose, Bld: 262 mg/dL — ABNORMAL HIGH (ref 70–99)
Potassium: 3.8 mEq/L (ref 3.5–5.1)
Sodium: 135 mEq/L (ref 135–145)

## 2013-07-19 LAB — LIPID PANEL
Cholesterol: 133 mg/dL (ref 0–200)
HDL: 36.2 mg/dL — ABNORMAL LOW (ref >39.00)
LDL Cholesterol: 66 mg/dL (ref 0–99)
Total CHOL/HDL Ratio: 4
Triglycerides: 153 mg/dL — ABNORMAL HIGH (ref 0.0–149.0)
VLDL: 30.6 mg/dL (ref 0.0–40.0)

## 2013-07-19 NOTE — Patient Instructions (Addendum)
Continue with your current medicines  I will see you in 6 months  We will check labs today  Call the [facility] office at [phone] if you have any questions, problems or concerns.

## 2013-07-19 NOTE — Progress Notes (Signed)
[name] Date of Birth: 12-14-1917 Medical Record #[id number]  History of Present Illness: Adrienne Bowman is seen back today for a 3 month check. Seen for Dr. Antoine Poche. Former patient of Dr. Ronnald Nian. Has a history of CAD, s/p CABG back in 2002 with LIMA to LAD, SVG to OM1/OM2, SVG to DX, SVG to PD/distal RCA. Other issues include PVD with prior left leg above the knee amputation, s/p prior right lower extremity revascularization, remote renal artery stenting and followed by [facility], type 2 DM, resistant HTN, CKD, and has an EF of 45 to 50% per cath back in 2002. Has been managed conservatively. She is no longer on ACE or aldactone due to her progressive kidney disease. She has never wanted much done for her and does not like to take her medicines.   Last seen in June - had run out of Caduet due to no supply at drug store. This was restarted.   Comes back today. Here with her family. Soon to be 79 years old. Doing ok. BP remains high. Has been seen recently at [facility] - remains in observation mode - BP was high there as well. Only taking her Hydralazine twice a day - says it makes her go to the bathroom too much. She says she has been taking her other medicines as prescribed. No chest pain. Not short of breath. Still lives alone. Still pretty independent. Family thinks she is doing ok. Has some indigestion on occasion but no exertional symptoms.   Current Outpatient Prescriptions  Medication Sig Dispense Refill  . amlodipine-atorvastatin (CADUET) 10-10 MG per tablet Take 1 tablet by mouth daily.  30 tablet  11  . aspirin 81 MG tablet Take 81 mg by mouth daily.        [name] atenolol (TENORMIN) 50 MG tablet TAKE ONE TABLET BY MOUTH EVERY DAY  30 tablet  5  . cloNIDine (CATAPRES) 0.3 MG tablet TAKE ONE TABLET BY MOUTH TWICE DAILY  60 tablet  9  . famotidine (PEPCID) 20 MG tablet TAKE ONE TABLET BY MOUTH TWICE DAILY  60 tablet  0  . furosemide (LASIX) 40 MG tablet TAKE ONE TABLET BY MOUTH ONCE DAILY  30 tablet   3  . glyBURIDE (DIABETA) 1.25 MG tablet Take 1 tablet (1.25 mg total) by mouth daily with breakfast.  30 tablet  3  . hydrALAZINE (APRESOLINE) 100 MG tablet Take 1 tablet (100 mg total) by mouth 3 (three) times daily.  90 tablet  11  . multivitamin (THERAGRAN) per tablet Take 1 tablet by mouth daily.        . potassium chloride SA (K-DUR,KLOR-CON) 20 MEQ tablet Take 20 mEq by mouth daily.       No current facility-administered medications for this visit.    No Known Allergies  Past Medical History  Diagnosis Date  . CHF (congestive heart failure) 2002    acute CHF while getting lower extremilty arteriograms /  Led to CABG  x6  . Hypertension     very difficult to control  . PVD (peripheral vascular disease)     extensive PVD / left AKA  . Murmur, heart     soft systolic outflow murmur and a grade 2/6 murmur of the aortic insufficiency  . S/P AKA (above knee amputation)     left AKA with prosthesis  . DIABETES MELLITUS, TYPE II   . NEPHROPATHY, DIABETIC   . Coronary artery disease 2002    CABG x6 / left internal mammary artery graft  to the LAD, a saphenous vein graft to the diagonal, a sequential saphenous vein graft to the 1st and 2nd obtuse marginal branches, and a sequential saphenous vein graft the the distal right coronary artery and posterior descending  . Anemia   . CKD (chronic kidney disease)   . Advanced age     Past Surgical History  Procedure Laterality Date  . Coronary artery bypass graft  2002    x6 per Dr. Laneta Simmers  . Eye surgery  1987 & 1988     left eye  . Vascular surgery      multiple peripheral vascular surgeries  . Left aka  2006  . Right renal artery stent  2004  . Rfpbpg  2002  . Lower extremity angiogram  February 21, 2012  . Pr vein bypass graft,aorto-fem-pop  Oct. 17, 2012    Right  Fem-Distal post. Tibial artery BPG.    History  Smoking status  . Never Smoker   Smokeless tobacco  . Never Used    History  Alcohol Use No    Family History    Problem Relation Age of Onset  . Coronary artery disease      prevalent in sibling  . Heart disease Mother   . Hypertension Daughter     Review of Systems: The review of systems is per the HPI.  All other systems were reviewed and are negative.  Physical Exam: BP 190/58  Pulse 56  Ht 5\' 4"  (1.626 m)  Wt 117 lb 12.8 oz (53.434 kg)  BMI 20.21 kg/m2 BP is 180/60 by me. Patient is very pleasant and in no acute distress. Remains thin. Skin is warm and dry. Color is normal.  HEENT is unremarkable. Normocephalic/atraumatic. PERRL. Sclera are nonicteric. Neck is supple. No masses. No JVD. Lungs are clear. Cardiac exam shows a regular rate and rhythm. Abdomen is soft. Extremities are without edema. Left AKA noted. Gait and ROM are intact. She is using a walker. Left leg prosthesis in place. No gross neurologic deficits noted.  LABORATORY DATA:  Lab Results  Component Value Date   WBC 4.9 04/17/2012   HGB 11.9* 12/25/2012   HCT 35.0* 12/25/2012   PLT 249 04/17/2012   GLUCOSE 154* 04/27/2013   CHOL 131 08/16/2012   TRIG 230.0* 08/16/2012   HDL 39.80 08/16/2012   LDLDIRECT 59.6 08/16/2012   LDLCALC 42 05/30/2011   ALT 17 01/21/2013   AST 26 01/21/2013   NA 137 04/27/2013   K 4.4 04/27/2013   CL 102 04/27/2013   CREATININE 2.13* 04/27/2013   BUN 57* 04/27/2013   CO2 27 04/27/2013   HGBA1C 7.7* 01/21/2013     Assessment / Plan:  1. HTN - resistant - this is a chronic issue - she does not want further testing done. I have suggested that she go back to TID scheduling of her Hydralazine but she does not wish to do that. Will see her back in 6 months. Overall, she continues to do surprisingly well.   2. CAD - remote CABG - no angina reported.   3. PVD - followed by [facility]  4. Advanced age - soon to be 79.  See her back in 6 months. She continues to do surprisingly well despite all of her issues. No change in medicines today.   Patient is agreeable to this plan and will call if any problems develop  in the interim.   Rosalio Macadamia, RN, ANP-C [facility] [address]  [address] [place], [place]  [address]

## 2013-07-25 ENCOUNTER — Other Ambulatory Visit: Payer: Self-pay | Admitting: *Deleted

## 2013-08-09 ENCOUNTER — Other Ambulatory Visit: Payer: Self-pay | Admitting: Nurse Practitioner

## 2013-09-10 ENCOUNTER — Other Ambulatory Visit: Payer: Self-pay | Admitting: Nurse Practitioner

## 2013-09-11 ENCOUNTER — Other Ambulatory Visit: Payer: Self-pay | Admitting: *Deleted

## 2013-09-11 MED ORDER — GLYBURIDE 1.25 MG PO TABS: 1.2500 mg | ORAL_TABLET | Freq: Every day | ORAL | Status: DC

## 2013-09-18 ENCOUNTER — Ambulatory Visit (INDEPENDENT_AMBULATORY_CARE_PROVIDER_SITE_OTHER): Payer: Medicare Other | Admitting: Ophthalmology

## 2013-10-08 ENCOUNTER — Ambulatory Visit (INDEPENDENT_AMBULATORY_CARE_PROVIDER_SITE_OTHER): Payer: Medicare Other | Admitting: Ophthalmology

## 2013-10-08 DIAGNOSIS — E11319 Type 2 diabetes mellitus with unspecified diabetic retinopathy without macular edema: Secondary | ICD-10-CM

## 2013-10-08 DIAGNOSIS — H348392 Tributary (branch) retinal vein occlusion, unspecified eye, stable: Secondary | ICD-10-CM

## 2013-10-08 DIAGNOSIS — E1139 Type 2 diabetes mellitus with other diabetic ophthalmic complication: Secondary | ICD-10-CM

## 2013-10-08 DIAGNOSIS — I10 Essential (primary) hypertension: Secondary | ICD-10-CM

## 2013-10-08 DIAGNOSIS — H35039 Hypertensive retinopathy, unspecified eye: Secondary | ICD-10-CM

## 2013-10-14 ENCOUNTER — Other Ambulatory Visit: Payer: Self-pay | Admitting: *Deleted

## 2013-10-14 MED ORDER — FUROSEMIDE 40 MG PO TABS: 40.0000 mg | ORAL_TABLET | Freq: Every day | ORAL | Status: DC

## 2013-10-14 NOTE — Telephone Encounter (Signed)
Received faxed refill request from pharmacy. Refill sent to pharmacy electronically. 

## 2013-11-18 ENCOUNTER — Other Ambulatory Visit: Payer: Self-pay | Admitting: *Deleted

## 2013-11-18 MED ORDER — FAMOTIDINE 20 MG PO TABS: ORAL_TABLET | ORAL | Status: DC

## 2013-11-18 MED ORDER — FUROSEMIDE 40 MG PO TABS: 40.0000 mg | ORAL_TABLET | Freq: Every day | ORAL | Status: DC

## 2013-12-18 ENCOUNTER — Other Ambulatory Visit: Payer: Self-pay | Admitting: Vascular Surgery

## 2013-12-18 ENCOUNTER — Other Ambulatory Visit: Payer: Self-pay | Admitting: Family

## 2013-12-18 DIAGNOSIS — R0989 Other specified symptoms and signs involving the circulatory and respiratory systems: Secondary | ICD-10-CM

## 2013-12-18 DIAGNOSIS — I739 Peripheral vascular disease, unspecified: Secondary | ICD-10-CM

## 2013-12-18 DIAGNOSIS — Z48812 Encounter for surgical aftercare following surgery on the circulatory system: Secondary | ICD-10-CM

## 2014-01-09 ENCOUNTER — Other Ambulatory Visit: Payer: Self-pay | Admitting: *Deleted

## 2014-01-09 DIAGNOSIS — I739 Peripheral vascular disease, unspecified: Secondary | ICD-10-CM

## 2014-01-23 ENCOUNTER — Other Ambulatory Visit: Payer: Self-pay | Admitting: Cardiology

## 2014-01-23 ENCOUNTER — Other Ambulatory Visit: Payer: Self-pay | Admitting: Nurse Practitioner

## 2014-01-24 ENCOUNTER — Other Ambulatory Visit: Payer: Self-pay

## 2014-01-24 ENCOUNTER — Encounter: Payer: Self-pay | Admitting: Nurse Practitioner

## 2014-01-24 ENCOUNTER — Ambulatory Visit (INDEPENDENT_AMBULATORY_CARE_PROVIDER_SITE_OTHER): Payer: Medicare Other | Admitting: Nurse Practitioner

## 2014-01-24 VITALS — BP 200/70 | HR 80 | Wt 119.8 lb

## 2014-01-24 DIAGNOSIS — I10 Essential (primary) hypertension: Secondary | ICD-10-CM

## 2014-01-24 DIAGNOSIS — I2510 Atherosclerotic heart disease of native coronary artery without angina pectoris: Secondary | ICD-10-CM

## 2014-01-24 LAB — HEPATIC FUNCTION PANEL
ALT: 19 U/L (ref 0–35)
AST: 26 U/L (ref 0–37)
Albumin: 4.1 g/dL (ref 3.5–5.2)
Alkaline Phosphatase: 63 U/L (ref 39–117)
Bilirubin, Direct: 0 mg/dL (ref 0.0–0.3)
Total Bilirubin: 0.6 mg/dL (ref 0.3–1.2)
Total Protein: 7.5 g/dL (ref 6.0–8.3)

## 2014-01-24 LAB — CBC
HCT: 36.1 % (ref 36.0–46.0)
Hemoglobin: 11.9 g/dL — ABNORMAL LOW (ref 12.0–15.0)
MCHC: 33 g/dL (ref 30.0–36.0)
MCV: 86.7 fl (ref 78.0–100.0)
Platelets: 198 10*3/uL (ref 150.0–400.0)
RBC: 4.16 Mil/uL (ref 3.87–5.11)
RDW: 14.5 % (ref 11.5–14.6)
WBC: 5.7 10*3/uL (ref 4.5–10.5)

## 2014-01-24 LAB — LIPID PANEL
Cholesterol: 126 mg/dL (ref 0–200)
HDL: 41.2 mg/dL (ref >39.00)
LDL Cholesterol: 53 mg/dL (ref 0–99)
Total CHOL/HDL Ratio: 3
Triglycerides: 159 mg/dL — ABNORMAL HIGH (ref 0.0–149.0)
VLDL: 31.8 mg/dL (ref 0.0–40.0)

## 2014-01-24 LAB — BASIC METABOLIC PANEL
BUN: 60 mg/dL — ABNORMAL HIGH (ref 6–23)
CO2: 29 mEq/L (ref 19–32)
Calcium: 9.5 mg/dL (ref 8.4–10.5)
Chloride: 100 mEq/L (ref 96–112)
Creatinine, Ser: 2.4 mg/dL — ABNORMAL HIGH (ref 0.4–1.2)
GFR: 24.56 mL/min — ABNORMAL LOW (ref >60.00)
Glucose, Bld: 202 mg/dL — ABNORMAL HIGH (ref 70–99)
Potassium: 4.7 mEq/L (ref 3.5–5.1)
Sodium: 138 mEq/L (ref 135–145)

## 2014-01-24 MED ORDER — ATENOLOL 50 MG PO TABS: ORAL_TABLET | ORAL | Status: DC

## 2014-01-24 MED ORDER — CLONIDINE HCL 0.3 MG PO TABS: ORAL_TABLET | ORAL | Status: DC

## 2014-01-24 MED ORDER — HYDRALAZINE HCL 100 MG PO TABS: ORAL_TABLET | ORAL | Status: DC

## 2014-01-24 NOTE — Patient Instructions (Addendum)
Stay on your current medicines - please go by this list  Take your medicines when you get home - PLEASE  I have sent in a refill for the atenolol and the hydralazine for 3 month supply  We will check labs today  I will see you in 6 months  [facility] office at [phone] if you have any questions, problems or concerns.

## 2014-01-24 NOTE — Progress Notes (Signed)
Adrienne Bowman Date of Birth: October 26, 1918 Medical Record #[id number]  History of Present Illness: Adrienne Bowman is seen back today for a 6 month check. Seen for Dr. Antoine Poche. Former patient of Dr. Ronnald Nian. Has a history of CAD, s/p CABG back in 2002 with LIMA to LAD, SVG to OM1/OM2, SVG to DX, SVG to PD/distal RCA. Other issues include PVD with prior left leg above the knee amputation, s/p prior right lower extremity revascularization, remote renal artery stenting and followed by [facility], type 2 DM, resistant HTN, CKD, and has an EF of 45 to 50% per cath back in 2002. Has been managed conservatively. She is no longer on ACE or aldactone due to her progressive kidney disease. She has never wanted much done for her and does not like to take her medicines.   Last seen in September of 2014 - was doing ok but  BP remained high. Had been seen recently at [facility] - remains in observation mode - BP was high there as well. Was basically felt to be doing ok and at her baseline. Have tried to increase her hydralazine to TID but she does not wish to do.   Comes back today. Here with her family. Tells me she is doing fine. She is doing her own medicines - will not let anyone help or oversee her medicines. Did not take anything today. Sounds like she has ran out of some medicines. No chest pain. Not short of breath. BP still high. Has not seen her PCP.   Current Outpatient Prescriptions  Medication Sig Dispense Refill  . amlodipine-atorvastatin (CADUET) 10-10 MG per tablet Take 1 tablet by mouth daily.  30 tablet  11  . aspirin 81 MG tablet Take 81 mg by mouth daily.        [name] atenolol (TENORMIN) 50 MG tablet TAKE ONE TABLET BY MOUTH ONCE DAILY  90 tablet  3  . cloNIDine (CATAPRES) 0.3 MG tablet TAKE ONE TABLET BY MOUTH TWICE DAILY  60 tablet  6  . famotidine (PEPCID) 20 MG tablet TAKE ONE TABLET BY MOUTH TWICE DAILY  60 tablet  2  . furosemide (LASIX) 40 MG tablet Take 1 tablet (40 mg total) by mouth daily.  30 tablet  2    . glyBURIDE (DIABETA) 1.25 MG tablet Take 1 tablet (1.25 mg total) by mouth daily with breakfast.  30 tablet  3  . hydrALAZINE (APRESOLINE) 100 MG tablet TAKE ONE TABLET BY MOUTH THREE TIMES DAILY  270 tablet  3  . multivitamin (THERAGRAN) per tablet Take 1 tablet by mouth daily.        . potassium chloride SA (K-DUR,KLOR-CON) 20 MEQ tablet Take 20 mEq by mouth daily.       No current facility-administered medications for this visit.    No Known Allergies  Past Medical History  Diagnosis Date  . CHF (congestive heart failure) 2002    acute CHF while getting lower extremilty arteriograms /  Led to CABG  x6  . Hypertension     very difficult to control  . PVD (peripheral vascular disease)     extensive PVD / left AKA  . Murmur, heart     soft systolic outflow murmur and a grade 2/6 murmur of the aortic insufficiency  . S/P AKA (above knee amputation)     left AKA with prosthesis  . DIABETES MELLITUS, TYPE II   . NEPHROPATHY, DIABETIC   . Coronary artery disease 2002    CABG x6 / left internal  mammary artery graft to the LAD, a saphenous vein graft to the diagonal, a sequential saphenous vein graft to the 1st and 2nd obtuse marginal branches, and a sequential saphenous vein graft the the distal right coronary artery and posterior descending  . Anemia   . CKD (chronic kidney disease)   . Advanced age     Past Surgical History  Procedure Laterality Date  . Coronary artery bypass graft  2002    x6 per Dr. Laneta SimmersBartle  . Eye surgery  1987 & 1988     left eye  . Vascular surgery      multiple peripheral vascular surgeries  . Left aka  2006  . Right renal artery stent  2004  . Rfpbpg  2002  . Lower extremity angiogram  February 21, 2012  . Pr vein bypass graft,aorto-fem-pop  Oct. 17, 2012    Right  Fem-Distal post. Tibial artery BPG.    History  Smoking status  . Never Smoker   Smokeless tobacco  . Never Used    History  Alcohol Use No    Family History  Problem Relation  Age of Onset  . Coronary artery disease      prevalent in sibling  . Heart disease Mother   . Hypertension Daughter     Review of Systems: The review of systems is per the HPI.  All other systems were reviewed and are negative.  Physical Exam: BP 200/70  Pulse 80  Wt 119 lb 12.8 oz (54.341 kg)  SpO2 100% Patient is very pleasant and in no acute distress. Skin is warm and dry. Color is normal.  HEENT is unremarkable. Normocephalic/atraumatic. PERRL. Sclera are nonicteric. Neck is supple. No masses. No JVD. Lungs are clear. Cardiac exam shows a regular rate and rhythm. Abdomen is soft. Extremities are without edema. Gait and ROM are intact. No gross neurologic deficits noted.  LABORATORY DATA: PENDING  Lab Results  Component Value Date   WBC 4.9 04/17/2012   HGB 11.9* 12/25/2012   HCT 35.0* 12/25/2012   PLT 249 04/17/2012   GLUCOSE 262* 07/19/2013   CHOL 133 07/19/2013   TRIG 153.0* 07/19/2013   HDL 36.20* 07/19/2013   LDLDIRECT 59.6 08/16/2012   LDLCALC 66 07/19/2013   ALT 15 07/19/2013   AST 24 07/19/2013   NA 135 07/19/2013   K 3.8 07/19/2013   CL 100 07/19/2013   CREATININE 2.3* 07/19/2013   BUN 49* 07/19/2013   CO2 30 07/19/2013   HGBA1C 7.7* 01/21/2013    Assessment / Plan: 1. HTN - resistant - this is a chronic issue - I suspect medication noncompliance is a big issue here - she is very resistant to letting anyone else help oversee/manage her medicines. Looks like she is out of atenolol and hydralazine which I have sent in refills for today. She has had no medicines today and I have asked her to take her medicines when she gets home. I have once again suggested that she go back to TID scheduling of her Hydralazine but she does not wish to do that. Will see her back in 6 months. Overall, she continues to do surprisingly well.   2. CAD - remote CABG - no angina reported.   3. PVD - followed by [facility]   4. Advanced age   See her back in 6 months. She continues to do surprisingly well despite all of  her issues. Tried to encourage her to just take her medicines per the list given today.  Patient is agreeable to this plan and will call if any problems develop in the interim.   Rosalio Macadamia, RN, ANP-C  [facility]  [address]  [place], [place] [address]

## 2014-01-27 ENCOUNTER — Other Ambulatory Visit: Payer: Self-pay

## 2014-01-27 ENCOUNTER — Encounter: Payer: Self-pay | Admitting: Family

## 2014-01-27 NOTE — Telephone Encounter (Signed)
Pt left v/m requesting refill glyburide to [facility] [address]; pts last hgb A1c 01/21/13.Please advise.

## 2014-01-28 ENCOUNTER — Encounter: Payer: Self-pay | Admitting: Family

## 2014-01-28 ENCOUNTER — Ambulatory Visit (INDEPENDENT_AMBULATORY_CARE_PROVIDER_SITE_OTHER)
Admission: RE | Admit: 2014-01-28 | Discharge: 2014-01-28 | Disposition: A | Discharge status: Home / Self Care | Payer: Medicare Other | Source: Ambulatory Visit

## 2014-01-28 ENCOUNTER — Ambulatory Visit (INDEPENDENT_AMBULATORY_CARE_PROVIDER_SITE_OTHER)
Admission: RE | Admit: 2014-01-28 | Discharge: 2014-01-28 | Disposition: A | Discharge status: Home / Self Care | Payer: Medicare Other | Source: Ambulatory Visit | Attending: Family | Admitting: Family

## 2014-01-28 ENCOUNTER — Ambulatory Visit (HOSPITAL_COMMUNITY)
Admission: RE | Admit: 2014-01-28 | Discharge: 2014-01-28 | Disposition: A | Discharge status: Home / Self Care | Payer: Medicare Other | Source: Ambulatory Visit | Attending: Family | Admitting: Family

## 2014-01-28 ENCOUNTER — Ambulatory Visit (INDEPENDENT_AMBULATORY_CARE_PROVIDER_SITE_OTHER): Payer: Medicare Other | Admitting: Family

## 2014-01-28 ENCOUNTER — Other Ambulatory Visit: Payer: Self-pay | Admitting: Vascular Surgery

## 2014-01-28 VITALS — BP 234/82 | HR 64 | Temp 98.5°F | Resp 16 | Wt 119.0 lb

## 2014-01-28 DIAGNOSIS — Z48812 Encounter for surgical aftercare following surgery on the circulatory system: Secondary | ICD-10-CM

## 2014-01-28 DIAGNOSIS — I6529 Occlusion and stenosis of unspecified carotid artery: Secondary | ICD-10-CM

## 2014-01-28 DIAGNOSIS — R0989 Other specified symptoms and signs involving the circulatory and respiratory systems: Secondary | ICD-10-CM

## 2014-01-28 DIAGNOSIS — I739 Peripheral vascular disease, unspecified: Secondary | ICD-10-CM

## 2014-01-28 MED ORDER — GLYBURIDE 1.25 MG PO TABS: 1.2500 mg | ORAL_TABLET | Freq: Every day | ORAL | Status: DC

## 2014-01-28 NOTE — Patient Instructions (Signed)
Peripheral Vascular Disease Peripheral Vascular Disease (PVD), also called Peripheral Arterial Disease (PAD), is a circulation problem caused by cholesterol (atherosclerotic plaque) deposits in the arteries. PVD commonly occurs in the lower extremities (legs) but it can occur in other areas of the body, such as your arms. The cholesterol buildup in the arteries reduces blood flow which can cause pain and other serious problems. The presence of PVD can place a person at risk for Coronary Artery Disease (CAD).  CAUSES  Causes of PVD can be many. It is usually associated with more than one risk factor such as:   High Cholesterol.  Smoking.  Diabetes.  Lack of exercise or inactivity.  High blood pressure (hypertension).  Obesity.  Family history. SYMPTOMS   When the lower extremities are affected, patients with PVD may experience:  Leg pain with exertion or physical activity. This is called INTERMITTENT CLAUDICATION. This may present as cramping or numbness with physical activity. The location of the pain is associated with the level of blockage. For example, blockage at the abdominal level (distal abdominal aorta) may result in buttock or hip pain. Lower leg arterial blockage may result in calf pain.  As PVD becomes more severe, pain can develop with less physical activity.  In people with severe PVD, leg pain may occur at rest.  Other PVD signs and symptoms:  Leg numbness or weakness.  Coldness in the affected leg or foot, especially when compared to the other leg.  A change in leg color.  Patients with significant PVD are more prone to ulcers or sores on toes, feet or legs. These may take longer to heal or may reoccur. The ulcers or sores can become infected.  If signs and symptoms of PVD are ignored, gangrene may occur. This can result in the loss of toes or loss of an entire limb.  Not all leg pain is related to PVD. Other medical conditions can cause leg pain such  as:  Blood clots (embolism) or Deep Vein Thrombosis.  Inflammation of the blood vessels (vasculitis).  Spinal stenosis. DIAGNOSIS  Diagnosis of PVD can involve several different types of tests. These can include:  Pulse Volume Recording Method (PVR). This test is simple, painless and does not involve the use of X-rays. PVR involves measuring and comparing the blood pressure in the arms and legs. An ABI (Ankle-Brachial Index) is calculated. The normal ratio of blood pressures is 1. As this number becomes smaller, it indicates more severe disease.  < 0.95  indicates significant narrowing in one or more leg vessels.  <0.8 there will usually be pain in the foot, leg or buttock with exercise.  <0.4 will usually have pain in the legs at rest.  <0.25  usually indicates limb threatening PVD.  Doppler detection of pulses in the legs. This test is painless and checks to see if you have a pulses in your legs/feet.  A dye or contrast material (a substance that highlights the blood vessels so they show up on x-ray) may be given to help your caregiver better see the arteries for the following tests. The dye is eliminated from your body by the kidney's. Your caregiver may order blood work to check your kidney function and other laboratory values before the following tests are performed:  Magnetic Resonance Angiography (MRA). An MRA is a picture study of the blood vessels and arteries. The MRA machine uses a large magnet to produce images of the blood vessels.  Computed Tomography Angiography (CTA). A CTA is a   specialized x-ray that looks at how the blood flows in your blood vessels. An IV may be inserted into your arm so contrast dye can be injected.  Angiogram. Is a procedure that uses x-rays to look at your blood vessels. This procedure is minimally invasive, meaning a small incision (cut) is made in your groin. A small tube (catheter) is then inserted into the artery of your groin. The catheter is  guided to the blood vessel or artery your caregiver wants to examine. Contrast dye is injected into the catheter. X-rays are then taken of the blood vessel or artery. After the images are obtained, the catheter is taken out. TREATMENT  Treatment of PVD involves many interventions which may include:  Lifestyle changes:  Quitting smoking.  Exercise.  Following a low fat, low cholesterol diet.  Control of diabetes.  Foot care is very important to the PVD patient. Good foot care can help prevent infection.  Medication:  Cholesterol-lowering medicine.  Blood pressure medicine.  Anti-platelet drugs.  Certain medicines may reduce symptoms of Intermittent Claudication.  Interventional/Surgical options:  Angioplasty. An Angioplasty is a procedure that inflates a balloon in the blocked artery. This opens the blocked artery to improve blood flow.  Stent Implant. A wire mesh tube (stent) is placed in the artery. The stent expands and stays in place, allowing the artery to remain open.  Peripheral Bypass Surgery. This is a surgical procedure that reroutes the blood around a blocked artery to help improve blood flow. This type of procedure may be performed if Angioplasty or stent implants are not an option. SEEK IMMEDIATE MEDICAL CARE IF:   You develop pain or numbness in your arms or legs.  Your arm or leg turns cold, becomes blue in color.  You develop redness, warmth, swelling and pain in your arms or legs. MAKE SURE YOU:   Understand these instructions.  Will watch your condition.  Will get help right away if you are not doing well or get worse. Document Released: 12/08/2004 Document Revised: 01/23/2012 Document Reviewed: 11/04/2008 [employer] Patient Information 2014 [employer].   Stroke Prevention Some medical conditions and behaviors are associated with an increased chance of having a stroke. You may prevent a stroke by making healthy choices and managing medical  conditions. HOW CAN I REDUCE MY RISK OF HAVING A STROKE?   Stay physically active. Get at least 30 minutes of activity on most or all days.  Do not smoke. It may also be helpful to avoid exposure to secondhand smoke.  Limit alcohol use. Moderate alcohol use is considered to be:  No more than 2 drinks per day for men.  No more than 1 drink per day for nonpregnant women.  Eat healthy foods. This involves  Eating 5 or more servings of fruits and vegetables a day.  Following a diet that addresses high blood pressure (hypertension), high cholesterol, diabetes, or obesity.  Manage your cholesterol levels.  A diet low in saturated fat, trans fat, and cholesterol and high in fiber may control cholesterol levels.  Take any prescribed medicines to control cholesterol as directed by your health care provider.  Manage your diabetes.  A controlled-carbohydrate, controlled-sugar diet is recommended to manage diabetes.  Take any prescribed medicines to control diabetes as directed by your health care provider.  Control your hypertension.  A low-salt (sodium), low-saturated fat, low-trans fat, and low-cholesterol diet is recommended to manage hypertension.  Take any prescribed medicines to control hypertension as directed by your health care provider.    Maintain a healthy weight.  A reduced-calorie, low-sodium, low-saturated fat, low-trans fat, low-cholesterol diet is recommended to manage weight.  Stop drug abuse.  Avoid taking birth control pills.  Talk to your health care provider about the risks of taking birth control pills if you are over 35 years old, smoke, get migraines, or have ever had a blood clot.  Get evaluated for sleep disorders (sleep apnea).  Talk to your health care provider about getting a sleep evaluation if you snore a lot or have excessive sleepiness.  Take medicines as directed by your health care provider.  For some people, aspirin or blood thinners  (anticoagulants) are helpful in reducing the risk of forming abnormal blood clots that can lead to stroke. If you have the irregular heart rhythm of atrial fibrillation, you should be on a blood thinner unless there is a good reason you cannot take them.  Understand all your medicine instructions.  Make sure that other other conditions (such as anemia or atherosclerosis) are addressed. SEEK IMMEDIATE MEDICAL CARE IF:   You have sudden weakness or numbness of the face, arm, or leg, especially on one side of the body.  Your face or eyelid droops to one side.  You have sudden confusion.  You have trouble speaking (aphasia) or understanding.  You have sudden trouble seeing in one or both eyes.  You have sudden trouble walking.  You have dizziness.  You have a loss of balance or coordination.  You have a sudden, severe headache with no known cause.  You have new chest pain or an irregular heartbeat. Any of these symptoms may represent a serious problem that is an emergency. Do not wait to see if the symptoms will go away. Get medical help at once. Call your local emergency services  (911 in [place] Do not drive yourself to the hospital. Document Released: 12/08/2004 Document Revised: 08/21/2013 Document Reviewed: 05/03/2013 [employer] Patient Information 2014 [employer].  

## 2014-01-28 NOTE — Progress Notes (Signed)
[facility] [place] HISTORY AND PHYSICAL   MRN : [id number]  History of Present Illness:   Adrienne Bowman is a 80 y.o. female patient who is s/p right femoral to distal posterior tibial saphenous vein graft in 2012 by Dr. Hart Rochester. She had evidence of near occlusion at the distal anastomosis and angiogram was performed by Dr. Myra Gianotti on 12/25/12. This revealed total occlusion of the outflow vessel with patency of the graft through collateral branches. There was no potential for revision. She had a previous left AKA (2006). Right great toe and second toe amputations 2002.  She returns today for follow up. Pt. denies claudication at RLE. Pt. denies rest pain;  denies night pain  denies non healing ulcers on right lower extremity or left AKA stump.  The patient denies New Medical or Surgical History.  She denies history of stroke or TIA, reports MI many years ago. Dr. Baird Cancer replaced Dr. Ruben Gottron as her cardiologist; Dr. Ruben Gottron requested an MRA of renal arteries in 2006, she has a right renal artery stent. Pt states her blood pressure is always this high, she is taking 4 antihypertensive medications.   Pt Diabetic: Yes , states in good control  Pt smoker: non-smoker  Pt meds include:  Statin :Yes  Betablocker: Yes  ASA: Yes  Other anticoagulants/antiplatelets: no   Current Outpatient Prescriptions  Medication Sig Dispense Refill  . amlodipine-atorvastatin (CADUET) 10-10 MG per tablet Take 1 tablet by mouth daily.  30 tablet  11  . aspirin 81 MG tablet Take 81 mg by mouth daily.        [name] atenolol (TENORMIN) 50 MG tablet TAKE ONE TABLET BY MOUTH ONCE DAILY  90 tablet  3  . cloNIDine (CATAPRES) 0.3 MG tablet TAKE ONE TABLET BY MOUTH TWICE DAILY  60 tablet  6  . famotidine (PEPCID) 20 MG tablet TAKE ONE TABLET BY MOUTH TWICE DAILY  60 tablet  2  . furosemide (LASIX) 40 MG tablet Take 1 tablet (40 mg total) by mouth daily.  30 tablet  2  . glyBURIDE (DIABETA)  1.25 MG tablet Take 1 tablet (1.25 mg total) by mouth daily with breakfast.  30 tablet  0  . hydrALAZINE (APRESOLINE) 100 MG tablet TAKE ONE TABLET BY MOUTH THREE TIMES DAILY  270 tablet  3  . multivitamin (THERAGRAN) per tablet Take 1 tablet by mouth daily.        . potassium chloride SA (K-DUR,KLOR-CON) 20 MEQ tablet Take 20 mEq by mouth daily.       No current facility-administered medications for this visit.     Past Medical History  Diagnosis Date  . CHF (congestive heart failure) 2002    acute CHF while getting lower extremilty arteriograms /  Led to CABG  x6  . Hypertension     very difficult to control  . PVD (peripheral vascular disease)     extensive PVD / left AKA  . Murmur, heart     soft systolic outflow murmur and a grade 2/6 murmur of the aortic insufficiency  . S/P AKA (above knee amputation)     left AKA with prosthesis  . DIABETES MELLITUS, TYPE II   . NEPHROPATHY, DIABETIC   . Coronary artery disease 2002    CABG x6 / left internal mammary artery graft to the LAD, a saphenous vein graft to the diagonal, a sequential saphenous vein graft to the 1st and 2nd obtuse marginal branches, and a sequential saphenous vein graft the  the distal right coronary artery and posterior descending  . Anemia   . CKD (chronic kidney disease)   . Advanced age     Past Surgical History  Procedure Laterality Date  . Coronary artery bypass graft  2002    x6 per Dr. Laneta Simmers  . Eye surgery  1987 & 1988     left eye  . Vascular surgery      multiple peripheral vascular surgeries  . Left aka  2006  . Right renal artery stent  2004  . Rfpbpg  2002  . Lower extremity angiogram  February 21, 2012  . Pr vein bypass graft,aorto-fem-pop  Oct. 17, 2012    Right  Fem-Distal post. Tibial artery BPG.    Social History History  Substance Use Topics  . Smoking status: Never Smoker   . Smokeless tobacco: Never Used  . Alcohol Use: No    Family History Family History  Problem Relation Age  of Onset  . Coronary artery disease      prevalent in sibling  . Heart disease Mother   . Hypertension Daughter    No Known Allergies   REVIEW OF SYSTEMS: See HPI for pertinent positives and negatives.  Physical Examination Filed Vitals:   01/28/14 1546 01/28/14 1550  BP: 212/80 234/82  Pulse: 66 64  Temp: 98.5 F (36.9 C)   TempSrc: Oral   Resp: 16   Weight: 119 lb (53.978 kg)   SpO2: 99%    Body mass index is 20.42 kg/(m^2).  General: A&O x 3, WDWN,  Gait: with walker, slow and deliberate.  Eyes: PERRLA,  Pulmonary: CTAB, without wheezes , rales or rhonchi  Cardiac: regular Rythm , with murmur  Carotid Bruits  Left  Right    Positive  Positive   VASCULAR EXAM:  Extremities without ischemic changes, left AKA, wearing left LE prosthesis  without Gangrene; without open wounds.   LE Pulses  LEFT  RIGHT   FEMORAL  palpable  palpable   POPLITEAL  AKA  not palpable   POSTERIOR TIBIAL  AKA  palpable   DORSALIS PEDIS  ANTERIOR TIBIAL  AKA  not palpable    Abdomen: soft, NT, no masses  Skin: no rashes, ulcers noted  Musculoskeletal: no muscle wasting or atrophy. Right great toe and second toe amputated. Left AKA with prosthesis in place.  Neurologic: A&O X 3; Appropriate Affect ; SENSATION: normal; MOTOR FUNCTION: moving all extremities equally. Speech is fluent/normal    Significant Diagnostic Studies: 2006 MRA abdomen requested by Dr. Ruben Gottron: 1. Interval right renal artery stent. The stented portion of this vessel cannot be evaluated due to ferromagnetic artifact. The vessel beyond the stent is patent.  2. The left renal artery is patent.  3. Stable disease of the SMA and IMA.   Non-Invasive Vascular Imaging (01/28/2014):  CEREBROVASCULAR DUPLEX EVALUATION    INDICATION: Carotid Bruit    PREVIOUS INTERVENTION(S): N/A    DUPLEX EXAM:     RIGHT  LEFT  Peak Systolic Velocities (cm/s) End Diastolic Velocities (cm/s) Plaque LOCATION Peak Systolic Velocities  (cm/s) End Diastolic Velocities (cm/s) Plaque  286 6 HT CCA PROXIMAL 64 7   103 6 HT CCA MID 60 6 HT  102 3 CP CCA DISTAL 125 13 HT  189 0 CP ECA 256 21 CP  95 15 CP ICA PROXIMAL 182 21 CP  109 18  ICA MID 125 22 HT  87 13  ICA DISTAL 95 15     0.92 ICA /  CCA Ratio (PSV) 3.03  Antegrade Vertebral Flow Antegrade  236 Brachial Systolic Pressure (mmHg) 233  Triphasic Brachial Artery Waveforms Triphasic    Plaque Morphology:  HM = Homogeneous, HT = Heterogeneous, CP = Calcific Plaque, SP = Smooth Plaque, IP = Irregular Plaque     ADDITIONAL FINDINGS:     IMPRESSION: Bilateral common carotid artery disease present. Bilateral internal carotid artery stenosis present of less than 40% Bilateral external carotid artery stenosis present.    Compared to the previous exam:  No previous Carotid study to compare.   LOWER EXTREMITY ARTERIAL DUPLEX EVALUATION    INDICATION: Peripheral Vascular Disease     PREVIOUS INTERVENTION(S): Right femoral-tibial BPG 08/31/2011; Left AKA 2006; Right great toe and 2nd digit amputation 08/30/2001    DUPLEX EXAM:     RIGHT  LEFT   Peak Systolic Velocity (cm/s) Ratio (if abnormal) Waveform  Peak Systolic Velocity (cm/s) Ratio (if abnormal) Waveform  97/202 2.1 T Inflow Artery     156  T Proximal Anastomosis     37  T Proximal Graft     58  T Mid Graft     52/102 2.0 M/M  Distal Graft     41/428  M/M Distal Anastomosis     639 16.0 M Outflow Artery     0.86 Today's ABI / TBI   1.00 Previous ABI / TBI (07/09/2013 )     Waveform:    M - Monophasic       B - Biphasic       T - Triphasic  If Ankle Brachial Index (ABI) or Toe Brachial Index (TBI) performed, please see complete report     ADDITIONAL FINDINGS:                IMPRESSION: Elevated velocities present at the superficial femoral artery proximal to graft anastomosis which may suggest greater than 50% stenosis. Elevated velocities suggestive of hemodynamically significant stenosis  at the distal graft of 50%-70% and distal graft anastomosis of greater than 70%. Remainder of graft appears patent and without hemodynamically significant plaque.    Compared to the previous exam:  Decrease in the right ankle brachial index since previous study on 07/09/2013.     ASSESSMENT:  Urbano HeirJessie B Inks is a 80 y.o. female who is s/p right femoral to distal posterior tibial saphenous vein graft in 2012 by Dr. Hart RochesterLawson. She had evidence of near occlusion at the distal anastomosis and angiogram was performed by Dr. Myra GianottiBrabham on 12/25/12. This revealed total occlusion of the outflow vessel with patency of the graft through collateral branches. There was no potential for revision. She had a previous left AKA (2006). Right great toe and second toe amputations 2002.  Carotid Duplex was done for bilateral carotid bruits, no history of stroke ofr TIA's: Bilateral common carotid artery disease present. Bilateral internal carotid artery stenosis present of less than 40% Bilateral external carotid artery stenosis present.  Elevated velocities present at the superficial femoral artery proximal to graft anastomosis which may suggest greater than 50% stenosis. Elevated velocities suggestive of hemodynamically significant stenosis at the distal graft of 50%-70% and distal graft anastomosis of greater than 70%. Remainder of graft appears patent and without hemodynamically significant plaque.    Decrease in the right ankle brachial index since previous study on 07/09/2013.   639 PSV at 639 cm/sec, right outflow artery.   PLAN:   Based on today's exam and non-invasive vascular lab results, and after discussing with Dr. Arbie CookeyEarly, the patient  will follow up in 6 months with the following tests ABI's and RLE arterial Duplex, Carotid Duplex in 1 year. I discussed in depth with the patient the nature of atherosclerosis, and emphasized the importance of maximal medical management including strict control of blood  pressure, blood glucose, and lipid levels, obtaining regular exercise, and cessation of smoking.  The patient is aware that without maximal medical management the underlying atherosclerotic disease process will progress, limiting the benefit of any interventions. The patient was given information about stroke prevention and what symptoms should prompt the patient to seek immediate medical care.  The patient was given information about PAD including signs, symptoms, treatment, what symptoms should prompt the patient to seek immediate medical care, and risk reduction measures to take. Thank you for allowing [place] to participate in this patient's care.  Charisse March, RN, MSN, FNP-C [facility] Office: [phone]  Clinic MD: Early  01/28/2014 4:09 PM

## 2014-01-28 NOTE — Telephone Encounter (Signed)
Ok to refill one time only - needs a1c, BMET.

## 2014-02-03 ENCOUNTER — Other Ambulatory Visit: Payer: Self-pay | Admitting: *Deleted

## 2014-02-03 DIAGNOSIS — I739 Peripheral vascular disease, unspecified: Secondary | ICD-10-CM

## 2014-02-03 DIAGNOSIS — I6529 Occlusion and stenosis of unspecified carotid artery: Secondary | ICD-10-CM

## 2014-02-24 ENCOUNTER — Telehealth: Payer: Self-pay

## 2014-02-24 NOTE — Telephone Encounter (Signed)
Pt left note with copy of [facility] card requesting order faxed to [facility] at fax # [phone] for replacement liners; Adrienne Bowman is certified prosthetist and contact # [phone] or [phone].Please advise. Pt last saw Dr Dayton MartesAron 03/18/2013.

## 2014-02-24 NOTE — Telephone Encounter (Signed)
Spoke to [place] Beach[place]ica with the answering service and advised her to contact Thayer Ohmhris and inform him that pt will need to contact vascular surgeon

## 2014-02-24 NOTE — Telephone Encounter (Signed)
Her vascular surgeon is probably who prescribes this.

## 2014-02-28 ENCOUNTER — Other Ambulatory Visit: Payer: Self-pay | Admitting: Cardiology

## 2014-02-28 ENCOUNTER — Other Ambulatory Visit: Payer: Self-pay | Admitting: Nurse Practitioner

## 2014-03-03 ENCOUNTER — Other Ambulatory Visit: Payer: Self-pay | Admitting: Cardiology

## 2014-03-03 ENCOUNTER — Other Ambulatory Visit: Payer: Self-pay | Admitting: *Deleted

## 2014-03-03 MED ORDER — GLYBURIDE 1.25 MG PO TABS: 1.2500 mg | ORAL_TABLET | Freq: Every day | ORAL | Status: DC

## 2014-03-03 MED ORDER — FUROSEMIDE 40 MG PO TABS: 40.0000 mg | ORAL_TABLET | Freq: Every day | ORAL | Status: DC

## 2014-03-04 NOTE — Telephone Encounter (Signed)
Pt request Status of refill for glyburide; [facility]. Pt voiced understanding.

## 2014-03-24 ENCOUNTER — Ambulatory Visit (INDEPENDENT_AMBULATORY_CARE_PROVIDER_SITE_OTHER): Payer: Medicare Other | Admitting: Family Medicine

## 2014-03-24 ENCOUNTER — Encounter: Payer: Self-pay | Admitting: Family Medicine

## 2014-03-24 VITALS — BP 158/62 | HR 70 | Temp 98.3°F | Wt 103.0 lb

## 2014-03-24 DIAGNOSIS — I2510 Atherosclerotic heart disease of native coronary artery without angina pectoris: Secondary | ICD-10-CM

## 2014-03-24 DIAGNOSIS — I6529 Occlusion and stenosis of unspecified carotid artery: Secondary | ICD-10-CM

## 2014-03-24 DIAGNOSIS — E119 Type 2 diabetes mellitus without complications: Secondary | ICD-10-CM

## 2014-03-24 DIAGNOSIS — I10 Essential (primary) hypertension: Secondary | ICD-10-CM

## 2014-03-24 DIAGNOSIS — I509 Heart failure, unspecified: Secondary | ICD-10-CM

## 2014-03-24 DIAGNOSIS — I70219 Atherosclerosis of native arteries of extremities with intermittent claudication, unspecified extremity: Secondary | ICD-10-CM

## 2014-03-24 DIAGNOSIS — I739 Peripheral vascular disease, unspecified: Secondary | ICD-10-CM

## 2014-03-24 MED ORDER — GLYBURIDE 1.25 MG PO TABS: 1.2500 mg | ORAL_TABLET | Freq: Every day | ORAL | Status: DC

## 2014-03-24 NOTE — Assessment & Plan Note (Signed)
Surprisingly well controlled today.

## 2014-03-24 NOTE — Patient Instructions (Signed)
Great to see you.  We will call you with your lab results. 

## 2014-03-24 NOTE — Progress Notes (Signed)
Pre visit review using our clinic review tool, if applicable. No additional management support is needed unless otherwise documented below in the visit note. 

## 2014-03-24 NOTE — Assessment & Plan Note (Signed)
Reasonable control. Recheck a1c today.

## 2014-03-24 NOTE — Assessment & Plan Note (Signed)
Refuses statin 

## 2014-03-24 NOTE — Assessment & Plan Note (Signed)
Followed by cardiology 

## 2014-03-24 NOTE — Progress Notes (Signed)
Very pleasant 80 yo AAF here with her daughter, with h/o CAD with prior CABG, HTN, DM, PVD with left AKA and prior angioplasty of the right femoral posterior tibial bypass here for follow up.    She says she is doing well. Has no complaints.   HTN- BP elevated again today.  BP has been this elevated in vascular office and cardiology last two months Rickey Primus(Lori Gernhardt)- notes reviewed.  She is asymptomac and often does not take her medications.   BP is remarkably good today. BP Readings from Last 3 Encounters:  03/24/14 158/62  01/28/14 234/82  01/24/14 200/70     PVD- followed by [facility].  DM-   On glyburide 1.24 mg daily.  Lab Results  Component Value Date   HGBA1C 7.7* 01/21/2013   Still seeing renal- saw him last month.  Dr. Caryn SectionFox retired.  Current Outpatient Prescriptions on File Prior to Visit  Medication Sig Dispense Refill  . amlodipine-atorvastatin (CADUET) 10-10 MG per tablet Take 1 tablet by mouth daily.  30 tablet  11  . aspirin 81 MG tablet Take 81 mg by mouth daily.        [name]. atenolol (TENORMIN) 50 MG tablet TAKE ONE TABLET BY MOUTH ONCE DAILY  90 tablet  3  . cloNIDine (CATAPRES) 0.3 MG tablet TAKE ONE TABLET BY MOUTH TWICE DAILY  60 tablet  6  . famotidine (PEPCID) 20 MG tablet TAKE ONE TABLET BY MOUTH TWICE DAILY  60 tablet  6  . furosemide (LASIX) 40 MG tablet Take 1 tablet (40 mg total) by mouth daily. Needs an appointment with Dr and labs, for any additional refills.  30 tablet  0  . glyBURIDE (DIABETA) 1.25 MG tablet Take 1 tablet (1.25 mg total) by mouth daily with breakfast. Needs an appointment with Dr and labs, for any additional refills.  30 tablet  0  . hydrALAZINE (APRESOLINE) 100 MG tablet TAKE ONE TABLET BY MOUTH THREE TIMES DAILY  90 tablet  6  . multivitamin (THERAGRAN) per tablet Take 1 tablet by mouth daily.         No current facility-administered medications on file prior to visit.    No Known Allergies  Past Medical History  Diagnosis Date  . CHF  (congestive heart failure) 2002    acute CHF while getting lower extremilty arteriograms /  Led to CABG  x6  . Hypertension     very difficult to control  . PVD (peripheral vascular disease)     extensive PVD / left AKA  . Murmur, heart     soft systolic outflow murmur and a grade 2/6 murmur of the aortic insufficiency  . S/P AKA (above knee amputation)     left AKA with prosthesis  . DIABETES MELLITUS, TYPE II   . NEPHROPATHY, DIABETIC   . Coronary artery disease 2002    CABG x6 / left internal mammary artery graft to the LAD, a saphenous vein graft to the diagonal, a sequential saphenous vein graft to the 1st and 2nd obtuse marginal branches, and a sequential saphenous vein graft the the distal right coronary artery and posterior descending  . Anemia   . CKD (chronic kidney disease)   . Advanced age     Past Surgical History  Procedure Laterality Date  . Coronary artery bypass graft  2002    x6 per Dr. Laneta SimmersBartle  . Eye surgery  1987 & 1988     left eye  . Vascular surgery  multiple peripheral vascular surgeries  . Left aka  2006  . Right renal artery stent  2004  . Rfpbpg  2002  . Lower extremity angiogram  February 21, 2012  . Pr vein bypass graft,aorto-fem-pop  Oct. 17, 2012    Right  Fem-Distal post. Tibial artery BPG.    History  Smoking status  . Never Smoker   Smokeless tobacco  . Never Used    History  Alcohol Use No    Family History  Problem Relation Age of Onset  . Coronary artery disease      prevalent in sibling  . Heart disease Mother   . Hypertension Daughter     Review of Systems: The review of systems is per the HPI.    Physical Exam: BP 158/62  Pulse 70  Temp(Src) 98.3 F (36.8 C) (Oral)  Wt 103 lb (46.72 kg)  SpO2 98%  Gen:  Alert, very pleasant as always, NAD.  HEENT is unremarkable. Normocephalic/atraumatic. PERRL. Sclera are nonicteric. Neck is supple. No masses. No JVD. Lungs are clear. Cardiac exam shows a regular rate and  rhythm. Abdomen is soft. Extremities are without edema. Left AKA noted. Gait and ROM are intact. She is using a walker and ambulates fairly well. No gross neurologic deficits noted.   LABORATORY DATA:  Lab Results  Component Value Date   WBC 5.7 01/24/2014   HGB 11.9* 01/24/2014   HCT 36.1 01/24/2014   PLT 198.0 01/24/2014   GLUCOSE 202* 01/24/2014   CHOL 126 01/24/2014   TRIG 159.0* 01/24/2014   HDL 41.20 01/24/2014   LDLDIRECT 59.6 08/16/2012   LDLCALC 53 01/24/2014   ALT 19 01/24/2014   AST 26 01/24/2014   NA 138 01/24/2014   K 4.7 01/24/2014   CL 100 01/24/2014   CREATININE 2.4* 01/24/2014   BUN 60* 01/24/2014   CO2 29 01/24/2014   HGBA1C 7.7* 01/21/2013

## 2014-03-25 LAB — COMPREHENSIVE METABOLIC PANEL
ALT: 13 U/L (ref 0–35)
AST: 24 U/L (ref 0–37)
Albumin: 3.8 g/dL (ref 3.5–5.2)
Alkaline Phosphatase: 54 U/L (ref 39–117)
BILIRUBIN TOTAL: 0.5 mg/dL (ref 0.2–1.2)
BUN: 62 mg/dL — ABNORMAL HIGH (ref 6–23)
CALCIUM: 9.3 mg/dL (ref 8.4–10.5)
CHLORIDE: 101 meq/L (ref 96–112)
CO2: 31 meq/L (ref 19–32)
CREATININE: 2.7 mg/dL — AB (ref 0.4–1.2)
GFR: 21.12 mL/min — ABNORMAL LOW (ref >60.00)
Glucose, Bld: 148 mg/dL — ABNORMAL HIGH (ref 70–99)
Potassium: 4.2 mEq/L (ref 3.5–5.1)
SODIUM: 139 meq/L (ref 135–145)
TOTAL PROTEIN: 6.8 g/dL (ref 6.0–8.3)

## 2014-03-25 LAB — HEMOGLOBIN A1C: HEMOGLOBIN A1C: 6.7 % — AB (ref 4.6–6.5)

## 2014-04-24 ENCOUNTER — Telehealth: Payer: Self-pay

## 2014-04-24 NOTE — Telephone Encounter (Signed)
Relevant patient education mailed to patient.  

## 2014-05-21 ENCOUNTER — Other Ambulatory Visit: Payer: Self-pay | Admitting: Physician Assistant

## 2014-05-26 ENCOUNTER — Other Ambulatory Visit: Payer: Self-pay | Admitting: *Deleted

## 2014-05-26 MED ORDER — FUROSEMIDE 40 MG PO TABS: 40.0000 mg | ORAL_TABLET | Freq: Every day | ORAL | Status: DC

## 2014-05-26 NOTE — Telephone Encounter (Signed)
Lady called and left v/m about status of furosemide refill; did not leave name or contact #. Refilled has already been done.

## 2014-07-30 ENCOUNTER — Other Ambulatory Visit: Payer: Self-pay | Admitting: Cardiology

## 2014-08-11 ENCOUNTER — Encounter: Payer: Self-pay | Admitting: Family

## 2014-08-12 ENCOUNTER — Ambulatory Visit (HOSPITAL_COMMUNITY)
Admission: RE | Admit: 2014-08-12 | Discharge: 2014-08-12 | Disposition: A | Discharge status: Home / Self Care | Payer: Medicare Other | Source: Ambulatory Visit | Attending: Vascular Surgery | Admitting: Vascular Surgery

## 2014-08-12 ENCOUNTER — Ambulatory Visit (INDEPENDENT_AMBULATORY_CARE_PROVIDER_SITE_OTHER)
Admission: RE | Admit: 2014-08-12 | Discharge: 2014-08-12 | Disposition: A | Discharge status: Home / Self Care | Payer: Medicare Other | Source: Ambulatory Visit | Attending: Vascular Surgery | Admitting: Vascular Surgery

## 2014-08-12 ENCOUNTER — Encounter: Payer: Self-pay | Admitting: Family

## 2014-08-12 ENCOUNTER — Ambulatory Visit (INDEPENDENT_AMBULATORY_CARE_PROVIDER_SITE_OTHER): Payer: Medicare Other | Admitting: Family

## 2014-08-12 VITALS — BP 188/58 | HR 58 | Resp 14 | Ht 64.0 in | Wt 112.0 lb

## 2014-08-12 DIAGNOSIS — Z48812 Encounter for surgical aftercare following surgery on the circulatory system: Secondary | ICD-10-CM

## 2014-08-12 DIAGNOSIS — I10 Essential (primary) hypertension: Secondary | ICD-10-CM | POA: Insufficient documentation

## 2014-08-12 DIAGNOSIS — I739 Peripheral vascular disease, unspecified: Secondary | ICD-10-CM

## 2014-08-12 DIAGNOSIS — E119 Type 2 diabetes mellitus without complications: Secondary | ICD-10-CM | POA: Insufficient documentation

## 2014-08-12 DIAGNOSIS — I6529 Occlusion and stenosis of unspecified carotid artery: Secondary | ICD-10-CM

## 2014-08-12 NOTE — Patient Instructions (Signed)
Peripheral Vascular Disease Peripheral Vascular Disease (PVD), also called Peripheral Arterial Disease (PAD), is a circulation problem caused by cholesterol (atherosclerotic plaque) deposits in the arteries. PVD commonly occurs in the lower extremities (legs) but it can occur in other areas of the body, such as your arms. The cholesterol buildup in the arteries reduces blood flow which can cause pain and other serious problems. The presence of PVD can place a person at risk for Coronary Artery Disease (CAD).  CAUSES  Causes of PVD can be many. It is usually associated with more than one risk factor such as:   High Cholesterol.  Smoking.  Diabetes.  Lack of exercise or inactivity.  High blood pressure (hypertension).  Obesity.  Family history. SYMPTOMS   When the lower extremities are affected, patients with PVD may experience:  Leg pain with exertion or physical activity. This is called INTERMITTENT CLAUDICATION. This may present as cramping or numbness with physical activity. The location of the pain is associated with the level of blockage. For example, blockage at the abdominal level (distal abdominal aorta) may result in buttock or hip pain. Lower leg arterial blockage may result in calf pain.  As PVD becomes more severe, pain can develop with less physical activity.  In people with severe PVD, leg pain may occur at rest.  Other PVD signs and symptoms:  Leg numbness or weakness.  Coldness in the affected leg or foot, especially when compared to the other leg.  A change in leg color.  Patients with significant PVD are more prone to ulcers or sores on toes, feet or legs. These may take longer to heal or may reoccur. The ulcers or sores can become infected.  If signs and symptoms of PVD are ignored, gangrene may occur. This can result in the loss of toes or loss of an entire limb.  Not all leg pain is related to PVD. Other medical conditions can cause leg pain such  as:  Blood clots (embolism) or Deep Vein Thrombosis.  Inflammation of the blood vessels (vasculitis).  Spinal stenosis. DIAGNOSIS  Diagnosis of PVD can involve several different types of tests. These can include:  Pulse Volume Recording Method (PVR). This test is simple, painless and does not involve the use of X-rays. PVR involves measuring and comparing the blood pressure in the arms and legs. An ABI (Ankle-Brachial Index) is calculated. The normal ratio of blood pressures is 1. As this number becomes smaller, it indicates more severe disease.  < 0.95 - indicates significant narrowing in one or more leg vessels.  <0.8 - there will usually be pain in the foot, leg or buttock with exercise.  <0.4 - will usually have pain in the legs at rest.  <0.25 - usually indicates limb threatening PVD.  Doppler detection of pulses in the legs. This test is painless and checks to see if you have a pulses in your legs/feet.  A dye or contrast material (a substance that highlights the blood vessels so they show up on x-ray) may be given to help your caregiver better see the arteries for the following tests. The dye is eliminated from your body by the kidney's. Your caregiver may order blood work to check your kidney function and other laboratory values before the following tests are performed:  Magnetic Resonance Angiography (MRA). An MRA is a picture study of the blood vessels and arteries. The MRA machine uses a large magnet to produce images of the blood vessels.  Computed Tomography Angiography (CTA). A CTA   is a specialized x-ray that looks at how the blood flows in your blood vessels. An IV may be inserted into your arm so contrast dye can be injected.  Angiogram. Is a procedure that uses x-rays to look at your blood vessels. This procedure is minimally invasive, meaning a small incision (cut) is made in your groin. A small tube (catheter) is then inserted into the artery of your groin. The catheter  is guided to the blood vessel or artery your caregiver wants to examine. Contrast dye is injected into the catheter. X-rays are then taken of the blood vessel or artery. After the images are obtained, the catheter is taken out. TREATMENT  Treatment of PVD involves many interventions which may include:  Lifestyle changes:  Quitting smoking.  Exercise.  Following a low fat, low cholesterol diet.  Control of diabetes.  Foot care is very important to the PVD patient. Good foot care can help prevent infection.  Medication:  Cholesterol-lowering medicine.  Blood pressure medicine.  Anti-platelet drugs.  Certain medicines may reduce symptoms of Intermittent Claudication.  Interventional/Surgical options:  Angioplasty. An Angioplasty is a procedure that inflates a balloon in the blocked artery. This opens the blocked artery to improve blood flow.  Stent Implant. A wire mesh tube (stent) is placed in the artery. The stent expands and stays in place, allowing the artery to remain open.  Peripheral Bypass Surgery. This is a surgical procedure that reroutes the blood around a blocked artery to help improve blood flow. This type of procedure may be performed if Angioplasty or stent implants are not an option. SEEK IMMEDIATE MEDICAL CARE IF:   You develop pain or numbness in your arms or legs.  Your arm or leg turns cold, becomes blue in color.  You develop redness, warmth, swelling and pain in your arms or legs. MAKE SURE YOU:   Understand these instructions.  Will watch your condition.  Will get help right away if you are not doing well or get worse. Document Released: 12/08/2004 Document Revised: 01/23/2012 Document Reviewed: 11/04/2008 Exit[employer] Patient Information 2015 [employer]. This information is not intended to replace advice given to you by your health care provider. Make sure you discuss any questions you have with your health care provider.   Stroke  Prevention Some medical conditions and behaviors are associated with an increased chance of having a stroke. You may prevent a stroke by making healthy choices and managing medical conditions. HOW CAN I REDUCE MY RISK OF HAVING A STROKE?   Stay physically active. Get at least 30 minutes of activity on most or all days.  Do not smoke. It may also be helpful to avoid exposure to secondhand smoke.  Limit alcohol use. Moderate alcohol use is considered to be:  No more than 2 drinks per day for men.  No more than 1 drink per day for nonpregnant women.  Eat healthy foods. This involves:  Eating 5 or more servings of fruits and vegetables a day.  Making dietary changes that address high blood pressure (hypertension), high cholesterol, diabetes, or obesity.  Manage your cholesterol levels.  Making food choices that are high in fiber and low in saturated fat, trans fat, and cholesterol may control cholesterol levels.  Take any prescribed medicines to control cholesterol as directed by your health care provider.  Manage your diabetes.  Controlling your carbohydrate and sugar intake is recommended to manage diabetes.  Take any prescribed medicines to control diabetes as directed by your health care provider.    Control your hypertension.  Making food choices that are low in salt (sodium), saturated fat, trans fat, and cholesterol is recommended to manage hypertension.  Take any prescribed medicines to control hypertension as directed by your health care provider.  Maintain a healthy weight.  Reducing calorie intake and making food choices that are low in sodium, saturated fat, trans fat, and cholesterol are recommended to manage weight.  Stop drug abuse.  Avoid taking birth control pills.  Talk to your health care provider about the risks of taking birth control pills if you are over 35 years old, smoke, get migraines, or have ever had a blood clot.  Get evaluated for sleep  disorders (sleep apnea).  Talk to your health care provider about getting a sleep evaluation if you snore a lot or have excessive sleepiness.  Take medicines only as directed by your health care provider.  For some people, aspirin or blood thinners (anticoagulants) are helpful in reducing the risk of forming abnormal blood clots that can lead to stroke. If you have the irregular heart rhythm of atrial fibrillation, you should be on a blood thinner unless there is a good reason you cannot take them.  Understand all your medicine instructions.  Make sure that other conditions (such as anemia or atherosclerosis) are addressed. SEEK IMMEDIATE MEDICAL CARE IF:   You have sudden weakness or numbness of the face, arm, or leg, especially on one side of the body.  Your face or eyelid droops to one side.  You have sudden confusion.  You have trouble speaking (aphasia) or understanding.  You have sudden trouble seeing in one or both eyes.  You have sudden trouble walking.  You have dizziness.  You have a loss of balance or coordination.  You have a sudden, severe headache with no known cause.  You have new chest pain or an irregular heartbeat. Any of these symptoms may represent a serious problem that is an emergency. Do not wait to see if the symptoms will go away. Get medical help at once. Call your local emergency services (911 in [place] Do not drive yourself to the hospital. Document Released: 12/08/2004 Document Revised: 03/17/2014 Document Reviewed: 05/03/2013 Exit[employer] Patient Information 2015 [employer]. This information is not intended to replace advice given to you by your health care provider. Make sure you discuss any questions you have with your health care provider.  

## 2014-08-12 NOTE — Progress Notes (Signed)
[facility] HISTORY AND PHYSICAL -PAD  History of Present Illness Adrienne Bowman is a 80 y.o. female who is s/p right femoral to distal posterior tibial saphenous vein graft in 2012 by Dr. Hart RochesterLawson. She had evidence of near occlusion at the distal anastomosis and angiogram was performed by Dr. Myra GianottiBrabham on 12/25/12. This revealed total occlusion of the outflow vessel with patency of the graft through collateral branches. There was no potential for revision. She had a previous left AKA (2006). Right great toe and second toe amputations 2002.  She returns today for follow up.  Pt. denies claudication at RLE.  Pt. denies rest pain;  denies night pain  denies non healing ulcers on right lower extremity or left AKA stump.  The patient denies New Medical or Surgical History.  She denies history of stroke or TIA, reports MI many years ago.  Dr. Baird CancerLaurie Gerhardt replaced Dr. Ruben Gottronenent as her cardiologist; Dr. Ruben Gottronenent requested an MRA of renal arteries in 2006, she has a right renal artery stent.  Pt states her blood pressure is always this high, she is taking 4 antihypertensive medications.   Pt Diabetic: Yes , states in good control  Pt smoker: non-smoker  Pt meds include:  Statin :Yes  Betablocker: Yes  ASA: Yes  Other anticoagulants/antiplatelets: no    Past Medical History  Diagnosis Date  . CHF (congestive heart failure) 2002    acute CHF while getting lower extremilty arteriograms /  Led to CABG  x6  . Hypertension     very difficult to control  . PVD (peripheral vascular disease)     extensive PVD / left AKA  . Murmur, heart     soft systolic outflow murmur and a grade 2/6 murmur of the aortic insufficiency  . S/P AKA (above knee amputation)     left AKA with prosthesis  . DIABETES MELLITUS, TYPE II   . NEPHROPATHY, DIABETIC   . Coronary artery disease 2002    CABG x6 / left internal mammary artery graft to the LAD, a saphenous vein graft to the diagonal, a  sequential saphenous vein graft to the 1st and 2nd obtuse marginal branches, and a sequential saphenous vein graft the the distal right coronary artery and posterior descending  . Anemia   . CKD (chronic kidney disease)   . Advanced age     Social History History  Substance Use Topics  . Smoking status: Never Smoker   . Smokeless tobacco: Never Used  . Alcohol Use: No    Family History Family History  Problem Relation Age of Onset  . Coronary artery disease      prevalent in sibling  . Heart disease Mother   . Hypertension Daughter     Past Surgical History  Procedure Laterality Date  . Coronary artery bypass graft  2002    x6 per Dr. Laneta SimmersBartle  . Eye surgery  1987 & 1988     left eye  . Vascular surgery      multiple peripheral vascular surgeries  . Left aka  2006  . Right renal artery stent  2004  . Rfpbpg  2002  . Lower extremity angiogram  February 21, 2012  . Pr vein bypass graft,aorto-fem-pop  Oct. 17, 2012    Right  Fem-Distal post. Tibial artery BPG.    No Known Allergies  Current Outpatient Prescriptions  Medication Sig Dispense Refill  . amlodipine-atorvastatin (CADUET) 10-10 MG per tablet TAKE ONE TABLET BY MOUTH ONCE  DAILY  30 tablet  0  . aspirin 81 MG tablet Take 81 mg by mouth daily.        [name] atenolol (TENORMIN) 50 MG tablet TAKE ONE TABLET BY MOUTH ONCE DAILY  90 tablet  3  . cloNIDine (CATAPRES) 0.3 MG tablet TAKE ONE TABLET BY MOUTH TWICE DAILY  60 tablet  6  . famotidine (PEPCID) 20 MG tablet TAKE ONE TABLET BY MOUTH TWICE DAILY  60 tablet  6  . furosemide (LASIX) 40 MG tablet Take 1 tablet (40 mg total) by mouth daily.  30 tablet  5  . glyBURIDE (DIABETA) 1.25 MG tablet Take 1 tablet (1.25 mg total) by mouth daily with breakfast. Needs an appointment with Dr and labs, for any additional refills.  30 tablet  6  . hydrALAZINE (APRESOLINE) 100 MG tablet TAKE ONE TABLET BY MOUTH THREE TIMES DAILY  90 tablet  6  . multivitamin (THERAGRAN) per tablet Take 1  tablet by mouth daily.         No current facility-administered medications for this visit.    ROS: See HPI for pertinent positives and negatives.   Physical Examination  Filed Vitals:   08/12/14 1142  BP: 188/58  Pulse: 58  Resp: 14  Height: 5\' 4"  (1.626 m)  Weight: 112 lb (50.803 kg)  SpO2: 100%   Body mass index is 19.22 kg/(m^2).  General: A&O x 3, WDWN,  Gait: with walker, slow and deliberate.  Eyes: PERRLA,  Pulmonary: CTAB, without wheezes , rales or rhonchi  Cardiac: regular Rythm , with murmur  Carotid Bruits  Left  Right    Positive  Positive   VASCULAR EXAM:  Extremities without ischemic changes, left AKA, wearing left LE prosthesis  without Gangrene; without open wounds.  LE Pulses  LEFT  RIGHT   FEMORAL  Not palpable  palpable   POPLITEAL  AKA  not palpable   POSTERIOR TIBIAL  AKA  2+palpable   DORSALIS PEDIS  ANTERIOR TIBIAL  AKA  not palpable, monophasic by Doppler    Abdomen: soft, NT, no masses  Skin: no rashes, ulcers noted  Musculoskeletal: no muscle wasting or atrophy. Right great toe and second toe amputated. Left AKA with prosthesis in place. No right LE edema. Neurologic: A&O X 3; Appropriate Affect ; SENSATION: normal; MOTOR FUNCTION: moving all extremities equally. Speech is fluent/normal    Significant Diagnostic Studies:  2006 MRA abdomen requested by Dr. Ruben Gottron:  1. Interval right renal artery stent. The stented portion of this vessel cannot be evaluated due to ferromagnetic artifact. The vessel beyond the stent is patent.  2. The left renal artery is patent.  3. Stable disease of the SMA and IMA.     Non-Invasive Vascular Imaging: DATE: 08/12/2014 LOWER EXTREMITY ARTERIAL DUPLEX EVALUATION    INDICATION: Follow-up right femorotibial arterial bypass graft     PREVIOUS INTERVENTION(S): Right femorotibial arterial bypass graft placed 08/30/2001 Left AKA 2006     DUPLEX EXAM:     RIGHT  LEFT   Peak Systolic Velocity (cm/s) Ratio  (if abnormal) Waveform  Peak Systolic Velocity (cm/s) Ratio (if abnormal) Waveform  86  B Inflow Artery     131   Proximal Anastomosis     49  T Proximal Graft     39  B Mid Graft     31; 95 3 M  Distal Graft     55  M Distal Anastomosis     0  Absent Outflow Artery  0.94 (unreliable) Today's ABI / TBI   0.86 Previous ABI / TBI (01/28/2014  )     Waveform:    M - Monophasic       B - Biphasic       T - Triphasic  If Ankle Brachial Index (ABI) or Toe Brachial Index (TBI) performed, please see complete report     ADDITIONAL FINDINGS:     IMPRESSION: Patent graft with evidence of diffuse degradation. There is a significant (>70%) stenosis in the distal graft with a known occlusion of the immediate outflow.    Compared to the previous exam:  No significant change compared to prior exam.      ASSESSMENT: Adrienne Bowman is a 80 y.o. female who is s/p right femorotibial arterial bypass graft placed 08/30/2001, left AKA in 2006. She denies claudication symptoms, denies non healing wounds.  Right LE arterial Duplex today reveals a patent graft with evidence of diffuse degradation. There is a significant (>70%) stenosis in the distal graft with a known occlusion of the immediate outflow. No significant change compared to prior exam.  Pt does not see a podiatrist, referral given.  PLAN:  I discussed in depth with the patient the nature of atherosclerosis, and emphasized the importance of maximal medical management including strict control of blood pressure, blood glucose, and lipid levels, obtaining regular exercise, and continued cessation of smoking.  The patient is aware that without maximal medical management the underlying atherosclerotic disease process will progress, limiting the benefit of any interventions.  Based on the patient's vascular studies and examination, pt will return to clinic in 6 months for ABI, right LE arterial Duplex, and carotid Duplex.  The patient was given  information about PAD including signs, symptoms, treatment, what symptoms should prompt the patient to seek immediate medical care, and risk reduction measures to take.  Charisse March, RN, MSN, FNP-C [facility] Phone: [phone]  Clinic MD: Early  08/12/2014 11:52 AM

## 2014-08-13 ENCOUNTER — Ambulatory Visit (INDEPENDENT_AMBULATORY_CARE_PROVIDER_SITE_OTHER): Payer: Medicare Other | Admitting: Nurse Practitioner

## 2014-08-13 ENCOUNTER — Encounter: Payer: Self-pay | Admitting: Nurse Practitioner

## 2014-08-13 VITALS — BP 180/70 | HR 74 | Wt 115.4 lb

## 2014-08-13 DIAGNOSIS — I6529 Occlusion and stenosis of unspecified carotid artery: Secondary | ICD-10-CM

## 2014-08-13 DIAGNOSIS — N189 Chronic kidney disease, unspecified: Secondary | ICD-10-CM

## 2014-08-13 DIAGNOSIS — E785 Hyperlipidemia, unspecified: Secondary | ICD-10-CM

## 2014-08-13 DIAGNOSIS — I10 Essential (primary) hypertension: Secondary | ICD-10-CM

## 2014-08-13 DIAGNOSIS — I739 Peripheral vascular disease, unspecified: Secondary | ICD-10-CM

## 2014-08-13 DIAGNOSIS — I2510 Atherosclerotic heart disease of native coronary artery without angina pectoris: Secondary | ICD-10-CM

## 2014-08-13 MED ORDER — AMLODIPINE-ATORVASTATIN 10-10 MG PO TABS: ORAL_TABLET | ORAL | Status: DC

## 2014-08-13 NOTE — Patient Instructions (Addendum)
Happy Birthday!!!!  Stay on your current medicines  I did send a refill for the amlodipine/atorvastatin to your pharmacy  I will see you in 6 months  Call the [facility] office at [phone] if you have any questions, problems or concerns.

## 2014-08-13 NOTE — Progress Notes (Signed)
[name] Date of Birth: June 01, 1918 Medical Record #[id number]  History of Present Illness: Jaycelyn is seen back today for a 6 month check. Seen for Dr. Antoine Poche. Former patient of Dr. Ronnald Nian. Has a history of CAD, s/p CABG back in 2002 with LIMA to LAD, SVG to OM1/OM2, SVG to DX, SVG to PD/distal RCA. Other issues include PVD with prior left leg above the knee amputation, s/p prior right lower extremity revascularization, remote renal artery stenting and followed by [facility], type 2 DM, resistant HTN, CKD, and has an EF of 45 to 50% per cath back in 2002. Has been managed conservatively. She is no longer on ACE or aldactone due to her progressive kidney disease. She has never wanted much done for her and does not like to take her medicines.   Last seen in March of 2015 - BP remained high.  Have tried to increase her hydralazine to TID but she does not wish to do.   BP was actually pretty good when she saw her PCP back in May.   Just seen yesterday at [facility] - Right LE arterial Duplex revealed a patent graft with evidence of diffuse degradation. There was a significant (>70%) stenosis in the distal graft with a known occlusion of the immediate outflow. "No significant change compared to prior exam." BP was high at that visit.   Comes back today. Here with her family. Tells me she is doing fine. She continues to manage her own medicines - will not let anyone help or oversee her medicines. Some days she takes them, somedays she does not. No chest pain. Not short of breath. Soon to turn 51. Not dizzy or lightheaded.   Current Outpatient Prescriptions  Medication Sig Dispense Refill  . amlodipine-atorvastatin (CADUET) 10-10 MG per tablet TAKE ONE TABLET BY MOUTH ONCE DAILY  30 tablet  0  . aspirin 81 MG tablet Take 81 mg by mouth daily.        [name] atenolol (TENORMIN) 50 MG tablet TAKE ONE TABLET BY MOUTH ONCE DAILY  90 tablet  3  . cloNIDine (CATAPRES) 0.3 MG tablet TAKE ONE TABLET BY MOUTH TWICE  DAILY  60 tablet  6  . famotidine (PEPCID) 20 MG tablet TAKE ONE TABLET BY MOUTH TWICE DAILY  60 tablet  6  . furosemide (LASIX) 40 MG tablet Take 1 tablet (40 mg total) by mouth daily.  30 tablet  5  . glyBURIDE (DIABETA) 1.25 MG tablet Take 1 tablet (1.25 mg total) by mouth daily with breakfast. Needs an appointment with Dr and labs, for any additional refills.  30 tablet  6  . hydrALAZINE (APRESOLINE) 100 MG tablet TAKE ONE TABLET BY MOUTH THREE TIMES DAILY  90 tablet  6  . multivitamin (THERAGRAN) per tablet Take 1 tablet by mouth daily.        [name] amlodipine-atorvastatin (CADUET) 10-10 MG per tablet TAKE ONE TABLET BY MOUTH EVERY DAY  90 tablet  3   No current facility-administered medications for this visit.    No Known Allergies  Past Medical History  Diagnosis Date  . CHF (congestive heart failure) 2002    acute CHF while getting lower extremilty arteriograms /  Led to CABG  x6  . Hypertension     very difficult to control  . PVD (peripheral vascular disease)     extensive PVD / left AKA  . Murmur, heart     soft systolic outflow murmur and a grade 2/6 murmur of the aortic  insufficiency  . S/P AKA (above knee amputation)     left AKA with prosthesis  . DIABETES MELLITUS, TYPE II   . NEPHROPATHY, DIABETIC   . Coronary artery disease 2002    CABG x6 / left internal mammary artery graft to the LAD, a saphenous vein graft to the diagonal, a sequential saphenous vein graft to the 1st and 2nd obtuse marginal branches, and a sequential saphenous vein graft the the distal right coronary artery and posterior descending  . Anemia   . CKD (chronic kidney disease)   . Advanced age     Past Surgical History  Procedure Laterality Date  . Coronary artery bypass graft  2002    x6 per Dr. Laneta Simmers  . Eye surgery  1987 & 1988     left eye  . Vascular surgery      multiple peripheral vascular surgeries  . Left aka  2006  . Right renal artery stent  2004  . Rfpbpg  2002  . Lower  extremity angiogram  February 21, 2012  . Pr vein bypass graft,aorto-fem-pop  Oct. 17, 2012    Right  Fem-Distal post. Tibial artery BPG.    History  Smoking status  . Never Smoker   Smokeless tobacco  . Never Used    History  Alcohol Use No    Family History  Problem Relation Age of Onset  . Coronary artery disease      prevalent in sibling  . Heart disease Mother   . Hypertension Daughter     Review of Systems: The review of systems is per the HPI.  All other systems were reviewed and are negative.  Physical Exam: BP 180/70  Pulse 74  Wt 115 lb 6.4 oz (52.345 kg)  SpO2 99% BP is 170/60 by me.  Patient is very pleasant and in no acute distress. Skin is warm and dry. Color is normal.  HEENT is unremarkable. Normocephalic/atraumatic. PERRL. Sclera are nonicteric. Neck is supple. No masses. No JVD. Lungs are clear. Cardiac exam shows a regular rate and rhythm. Abdomen is soft. Extremities are without edema. Gait and ROM are intact but using a walker and has a L AKA. [name] No gross neurologic deficits noted.  Wt Readings from Last 3 Encounters:  08/13/14 115 lb 6.4 oz (52.345 kg)  08/12/14 112 lb (50.803 kg)  03/24/14 103 lb (46.72 kg)    LABORATORY DATA/PROCEDURES:  Lab Results  Component Value Date   WBC 5.7 01/24/2014   HGB 11.9* 01/24/2014   HCT 36.1 01/24/2014   PLT 198.0 01/24/2014   GLUCOSE 148* 03/24/2014   CHOL 126 01/24/2014   TRIG 159.0* 01/24/2014   HDL 41.20 01/24/2014   LDLDIRECT 59.6 08/16/2012   LDLCALC 53 01/24/2014   ALT 13 03/24/2014   AST 24 03/24/2014   NA 139 03/24/2014   K 4.2 03/24/2014   CL 101 03/24/2014   CREATININE 2.7* 03/24/2014   BUN 62* 03/24/2014   CO2 31 03/24/2014   HGBA1C 6.7* 03/24/2014    BNP (last 3 results) No results found for this basename: PROBNP,  in the last 8760 hours   Assessment / Plan: 1. HTN - resistant - this is a chronic issue - I continue to suspect medication noncompliance is a big issue here - she is very resistant to  letting anyone else help oversee/manage her medicines. BP is a little better here today. She does not wish to make any changes and if I did, she will most likely not  take. See back in 6 months.   2. CAD - remote CABG - no angina reported.   3. PVD - followed by [facility]   4. CKD -  Managed conservatively.   5. Advanced age - turning [age] soon.  See back in 6 months. Check labs on return.  Patient is agreeable to this plan and will call if any problems develop in the interim.   Rosalio MacadamiaLori C. [place], RN, ANP-C [facility] [address] [place], [place]  [id number] [phone]

## 2014-09-03 ENCOUNTER — Other Ambulatory Visit: Payer: Self-pay

## 2014-09-03 MED ORDER — AMLODIPINE-ATORVASTATIN 10-10 MG PO TABS: ORAL_TABLET | ORAL | Status: DC

## 2014-10-08 ENCOUNTER — Ambulatory Visit (INDEPENDENT_AMBULATORY_CARE_PROVIDER_SITE_OTHER): Payer: Medicare Other | Admitting: Ophthalmology

## 2014-10-16 ENCOUNTER — Other Ambulatory Visit: Payer: Self-pay | Admitting: *Deleted

## 2014-10-16 MED ORDER — CLONIDINE HCL 0.3 MG PO TABS: ORAL_TABLET | ORAL | Status: DC

## 2014-10-17 ENCOUNTER — Ambulatory Visit (INDEPENDENT_AMBULATORY_CARE_PROVIDER_SITE_OTHER): Payer: Medicare Other | Admitting: Ophthalmology

## 2014-10-17 DIAGNOSIS — E11319 Type 2 diabetes mellitus with unspecified diabetic retinopathy without macular edema: Secondary | ICD-10-CM

## 2014-10-17 DIAGNOSIS — I10 Essential (primary) hypertension: Secondary | ICD-10-CM

## 2014-10-17 DIAGNOSIS — E11329 Type 2 diabetes mellitus with mild nonproliferative diabetic retinopathy without macular edema: Secondary | ICD-10-CM

## 2014-10-17 DIAGNOSIS — H43813 Vitreous degeneration, bilateral: Secondary | ICD-10-CM

## 2014-10-17 DIAGNOSIS — H35033 Hypertensive retinopathy, bilateral: Secondary | ICD-10-CM

## 2014-10-23 ENCOUNTER — Encounter (HOSPITAL_COMMUNITY): Payer: Self-pay | Admitting: Surgery

## 2014-11-19 ENCOUNTER — Other Ambulatory Visit: Payer: Self-pay | Admitting: *Deleted

## 2014-11-19 MED ORDER — GLYBURIDE 1.25 MG PO TABS: 1.2500 mg | ORAL_TABLET | Freq: Every day | ORAL | Status: DC

## 2014-12-10 ENCOUNTER — Other Ambulatory Visit: Payer: Self-pay | Admitting: *Deleted

## 2014-12-10 MED ORDER — FUROSEMIDE 40 MG PO TABS: 40.0000 mg | ORAL_TABLET | Freq: Every day | ORAL | Status: DC

## 2014-12-11 ENCOUNTER — Other Ambulatory Visit: Payer: Self-pay

## 2014-12-11 MED ORDER — FAMOTIDINE 20 MG PO TABS: 20.0000 mg | ORAL_TABLET | Freq: Two times a day (BID) | ORAL | Status: DC

## 2014-12-18 ENCOUNTER — Ambulatory Visit (INDEPENDENT_AMBULATORY_CARE_PROVIDER_SITE_OTHER): Payer: Medicare Other | Admitting: Family Medicine

## 2014-12-18 ENCOUNTER — Encounter: Payer: Self-pay | Admitting: Family Medicine

## 2014-12-18 VITALS — BP 222/128 | HR 78 | Temp 97.8°F | Wt 115.0 lb

## 2014-12-18 DIAGNOSIS — I10 Essential (primary) hypertension: Secondary | ICD-10-CM

## 2014-12-18 DIAGNOSIS — E1129 Type 2 diabetes mellitus with other diabetic kidney complication: Secondary | ICD-10-CM

## 2014-12-18 LAB — COMPREHENSIVE METABOLIC PANEL
ALT: 18 U/L (ref 0–35)
AST: 22 U/L (ref 0–37)
Albumin: 3.8 g/dL (ref 3.5–5.2)
Alkaline Phosphatase: 64 U/L (ref 39–117)
BILIRUBIN TOTAL: 0.4 mg/dL (ref 0.2–1.2)
BUN: 71 mg/dL — AB (ref 6–23)
CHLORIDE: 100 meq/L (ref 96–112)
CO2: 29 mEq/L (ref 19–32)
CREATININE: 2.88 mg/dL — AB (ref 0.40–1.20)
Calcium: 9.8 mg/dL (ref 8.4–10.5)
GFR: 19.58 mL/min — AB (ref >60.00)
Glucose, Bld: 147 mg/dL — ABNORMAL HIGH (ref 70–99)
POTASSIUM: 4.2 meq/L (ref 3.5–5.1)
Sodium: 138 mEq/L (ref 135–145)
TOTAL PROTEIN: 7 g/dL (ref 6.0–8.3)

## 2014-12-18 LAB — HEMOGLOBIN A1C: Hgb A1c MFr Bld: 6.5 % (ref 4.6–6.5)

## 2014-12-18 MED ORDER — GLYBURIDE 1.25 MG PO TABS: 1.2500 mg | ORAL_TABLET | Freq: Every day | ORAL | Status: DC

## 2014-12-18 NOTE — Progress Notes (Signed)
Pre visit review using our clinic review tool, if applicable. No additional management support is needed unless otherwise documented below in the visit note. 

## 2014-12-18 NOTE — Progress Notes (Signed)
Very pleasant 81 yo AAF here with her daughter, with h/o CAD with prior CABG, HTN, DM, PVD with left AKA and prior angioplasty of the right femoral posterior tibial bypass here for follow up.    She says she is doing well. Has no complaints.   HTN- BP extremely elevated again today- chronic and resistant issue- non compliance and resistance to adding rx per cardiology  Rickey Primus(Lori Gernhardt)- notes reviewed from 08/13/14. She refused changes to rx again at that office visit.  She is asymptomac and has not taken her rx today. BP Readings from Last 3 Encounters:  12/18/14 222/128  08/13/14 180/70  08/12/14 188/58     PVD- followed by [facility].  DM-   On glyburide 1.24 mg daily. Does not check FSBS regularly.  Does skip her rx if she feels "shaky."  This does not happen often.  Lab Results  Component Value Date   HGBA1C 6.7* 03/24/2014   Still seeing renal-  Lab Results  Component Value Date   CREATININE 2.7* 03/24/2014     Current Outpatient Prescriptions on File Prior to Visit  Medication Sig Dispense Refill  . amlodipine-atorvastatin (CADUET) 10-10 MG per tablet TAKE ONE TABLET BY MOUTH EVERY DAY 90 tablet 3  . aspirin 81 MG tablet Take 81 mg by mouth daily.      [name]. atenolol (TENORMIN) 50 MG tablet TAKE ONE TABLET BY MOUTH ONCE DAILY 90 tablet 3  . cloNIDine (CATAPRES) 0.3 MG tablet TAKE ONE TABLET BY MOUTH TWICE DAILY 60 tablet 3  . famotidine (PEPCID) 20 MG tablet Take 1 tablet (20 mg total) by mouth 2 (two) times daily. 60 tablet 6  . furosemide (LASIX) 40 MG tablet Take 1 tablet (40 mg total) by mouth daily. 30 tablet 5  . glyBURIDE (DIABETA) 1.25 MG tablet Take 1 tablet (1.25 mg total) by mouth daily with breakfast. Needs an appointment with Dr and labs, for any additional refills. 30 tablet 0  . hydrALAZINE (APRESOLINE) 100 MG tablet TAKE ONE TABLET BY MOUTH THREE TIMES DAILY 90 tablet 6  . multivitamin (THERAGRAN) per tablet Take 1 tablet by mouth daily.       No current  facility-administered medications on file prior to visit.    No Known Allergies  Past Medical History  Diagnosis Date  . CHF (congestive heart failure) 2002    acute CHF while getting lower extremilty arteriograms /  Led to CABG  x6  . Hypertension     very difficult to control  . PVD (peripheral vascular disease)     extensive PVD / left AKA  . Murmur, heart     soft systolic outflow murmur and a grade 2/6 murmur of the aortic insufficiency  . S/P AKA (above knee amputation)     left AKA with prosthesis  . DIABETES MELLITUS, TYPE II   . NEPHROPATHY, DIABETIC   . Coronary artery disease 2002    CABG x6 / left internal mammary artery graft to the LAD, a saphenous vein graft to the diagonal, a sequential saphenous vein graft to the 1st and 2nd obtuse marginal branches, and a sequential saphenous vein graft the the distal right coronary artery and posterior descending  . Anemia   . CKD (chronic kidney disease)   . Advanced age     Past Surgical History  Procedure Laterality Date  . Coronary artery bypass graft  2002    x6 per Dr. Laneta SimmersBartle  . Eye surgery  1987 & 1988  left eye  . Vascular surgery      multiple peripheral vascular surgeries  . Left aka  2006  . Right renal artery stent  2004  . Rfpbpg  2002  . Lower extremity angiogram  February 21, 2012  . Pr vein bypass graft,aorto-fem-pop  Oct. 17, 2012    Right  Fem-Distal post. Tibial artery BPG.  . Lower extremity angiogram N/A 02/21/2012    Procedure: LOWER EXTREMITY ANGIOGRAM;  Surgeon: Nada Libman, MD;  Location: [facility] CATH LAB;  Service: Cardiovascular;  Laterality: N/A;  . Lower extremity angiogram Right 12/25/2012    Procedure: LOWER EXTREMITY ANGIOGRAM;  Surgeon: Nada Libman, MD;  Location: [facility] CATH LAB;  Service: Cardiovascular;  Laterality: Right;  rt leg angio  . Abdominal angiogram  12/25/2012    Procedure: ABDOMINAL ANGIOGRAM;  Surgeon: Nada Libman, MD;  Location: [facility] CATH LAB;  Service: Cardiovascular;;     History  Smoking status  . Never Smoker   Smokeless tobacco  . Never Used    History  Alcohol Use No    Family History  Problem Relation Age of Onset  . Coronary artery disease      prevalent in sibling  . Heart disease Mother   . Hypertension Daughter     Review of Systems: The review of systems is per the HPI.    Physical Exam: BP 222/128 mmHg  Pulse 78  Temp(Src) 97.8 F (36.6 C) (Oral)  Wt 115 lb (52.164 kg)  SpO2 97%  Gen:  Alert, very pleasant as always in good spirits, NAD.  HEENT is unremarkable. Normocephalic/atraumatic. PERRL. Sclera are nonicteric. Neck is supple. No masses. No JVD. Lungs are clear. Cardiac exam shows a regular rate and rhythm. Abdomen is soft. Extremities are without edema. Left AKA noted. Gait and ROM are intact. She is using a walker and ambulates fairly well. No gross neurologic deficits noted.   LABORATORY DATA:  Lab Results  Component Value Date   WBC 5.7 01/24/2014   HGB 11.9* 01/24/2014   HCT 36.1 01/24/2014   PLT 198.0 01/24/2014   GLUCOSE 148* 03/24/2014   CHOL 126 01/24/2014   TRIG 159.0* 01/24/2014   HDL 41.20 01/24/2014   LDLDIRECT 59.6 08/16/2012   LDLCALC 53 01/24/2014   ALT 13 03/24/2014   AST 24 03/24/2014   NA 139 03/24/2014   K 4.2 03/24/2014   CL 101 03/24/2014   CREATININE 2.7* 03/24/2014   BUN 62* 03/24/2014   CO2 31 03/24/2014   HGBA1C 6.7* 03/24/2014

## 2014-12-18 NOTE — Assessment & Plan Note (Signed)
Rx refilled. Check a1c today- may ultimately d/c glyburide if hypoglycemia symptoms worsen. The patient indicates understanding of these issues and agrees with the plan.

## 2014-12-18 NOTE — Patient Instructions (Signed)
Good to see you. We will call you with your lab results.  Please take your blood pressure medication right away.

## 2014-12-18 NOTE — Assessment & Plan Note (Signed)
Extremely elevated again today. She is not wanting to make changes- agrees to take her rx as soon as she gets home. Given her age and resistance to making changes, no other options explored today. The patient indicates understanding of these issues and agrees with the plan.

## 2015-02-02 ENCOUNTER — Ambulatory Visit (INDEPENDENT_AMBULATORY_CARE_PROVIDER_SITE_OTHER): Payer: Medicare Other | Admitting: Physician Assistant

## 2015-02-02 ENCOUNTER — Encounter: Payer: Self-pay | Admitting: Physician Assistant

## 2015-02-02 VITALS — BP 206/58 | HR 76 | Ht 64.0 in | Wt 110.0 lb

## 2015-02-02 DIAGNOSIS — I10 Essential (primary) hypertension: Secondary | ICD-10-CM

## 2015-02-02 DIAGNOSIS — I5022 Chronic systolic (congestive) heart failure: Secondary | ICD-10-CM | POA: Diagnosis not present

## 2015-02-02 DIAGNOSIS — N184 Chronic kidney disease, stage 4 (severe): Secondary | ICD-10-CM | POA: Diagnosis not present

## 2015-02-02 DIAGNOSIS — I2510 Atherosclerotic heart disease of native coronary artery without angina pectoris: Secondary | ICD-10-CM | POA: Diagnosis not present

## 2015-02-02 NOTE — Assessment & Plan Note (Signed)
Carotid Dopplers reviewed from 01/2014 less than 40% ICA stenosis.

## 2015-02-02 NOTE — Assessment & Plan Note (Signed)
Patient's blood pressure is elevated today. It seems to be a chronic problem for her. She is [age], lives alone and does not want to change her medications at this point. Follow-up with Dr. Antoine PocheHochrein in 6 months, Norma FredricksonLori Gerhardt, NP in 3 months

## 2015-02-02 NOTE — Assessment & Plan Note (Signed)
No evidence of heart failure and exam. 

## 2015-02-02 NOTE — Patient Instructions (Addendum)
Your physician recommends that you continue on your current medications as directed. Please refer to the Current Medication list given to you today.   Your physician recommends that you schedule a follow-up appointment in:  [facility] IN 3 MONTHS     Low-Sodium Eating Plan Sodium raises blood pressure and causes water to be held in the body. Getting less sodium from food will help lower your blood pressure, reduce any swelling, and protect your heart, liver, and kidneys. We get sodium by adding salt (sodium chloride) to food. Most of our sodium comes from canned, boxed, and frozen foods. Restaurant foods, fast foods, and pizza are also very high in sodium. Even if you take medicine to lower your blood pressure or to reduce fluid in your body, getting less sodium from your food is important. WHAT IS MY PLAN? Most people should limit their sodium intake to 2,300 mg a day. Your health care provider recommends that you limit your sodium intake to __________ a day.  WHAT DO I NEED TO KNOW ABOUT THIS EATING PLAN? For the low-sodium eating plan, you will follow these general guidelines:  Choose foods with a % Daily Value for sodium of less than 5% (as listed on the food label).   Use salt-free seasonings or herbs instead of table salt or sea salt.   Check with your health care provider or pharmacist before using salt substitutes.   Eat fresh foods.  Eat more vegetables and fruits.  Limit canned vegetables. If you do use them, rinse them well to decrease the sodium.   Limit cheese to 1 oz (28 g) per day.   Eat lower-sodium products, often labeled as "lower sodium" or "no salt added."  Avoid foods that contain monosodium glutamate (MSG). MSG is sometimes added to [place] food and some canned foods.  Check food labels (Nutrition Facts labels) on foods to learn how much sodium is in one serving.  Eat more home-cooked food and less restaurant, buffet, and fast food.  When eating at a  restaurant, ask that your food be prepared with less salt or none, if possible.  HOW DO I READ FOOD LABELS FOR SODIUM INFORMATION? The Nutrition Facts label lists the amount of sodium in one serving of the food. If you eat more than one serving, you must multiply the listed amount of sodium by the number of servings. Food labels may also identify foods as:  Sodium free--Less than 5 mg in a serving.  Very low sodium--35 mg or less in a serving.  Low sodium--140 mg or less in a serving.  Light in sodium--50% less sodium in a serving. For example, if a food that usually has 300 mg of sodium is changed to become light in sodium, it will have 150 mg of sodium.  Reduced sodium--25% less sodium in a serving. For example, if a food that usually has 400 mg of sodium is changed to reduced sodium, it will have 300 mg of sodium. WHAT FOODS CAN I EAT? Grains Low-sodium cereals, including oats, puffed wheat and rice, and shredded wheat cereals. Low-sodium crackers. Unsalted rice and pasta. Lower-sodium bread.  Vegetables Frozen or fresh vegetables. Low-sodium or reduced-sodium canned vegetables. Low-sodium or reduced-sodium tomato sauce and paste. Low-sodium or reduced-sodium tomato and vegetable juices.  Fruits Fresh, frozen, and canned fruit. Fruit juice.  Meat and Other Protein Products Low-sodium canned tuna and salmon. Fresh or frozen meat, poultry, seafood, and fish. Lamb. Unsalted nuts. Dried beans, peas, and lentils without added salt. Unsalted canned  beans. Homemade soups without salt. Eggs.  Dairy Milk. Soy milk. Ricotta cheese. Low-sodium or reduced-sodium cheeses. Yogurt.  Condiments Fresh and dried herbs and spices. Salt-free seasonings. Onion and garlic powders. Low-sodium varieties of mustard and ketchup. Lemon juice.  Fats and Oils Reduced-sodium salad dressings. Unsalted butter.  Other Unsalted popcorn and pretzels.  The items listed above may not be a complete list of  recommended foods or beverages. Contact your dietitian for more options. WHAT FOODS ARE NOT RECOMMENDED? Grains Instant hot cereals. Bread stuffing, pancake, and biscuit mixes. Croutons. Seasoned rice or pasta mixes. Noodle soup cups. Boxed or frozen macaroni and cheese. Self-rising flour. Regular salted crackers. Vegetables Regular canned vegetables. Regular canned tomato sauce and paste. Regular tomato and vegetable juices. Frozen vegetables in sauces. Salted french fries. Olives. Rosita FirePickles. Relishes. Sauerkraut. Salsa. Meat and Other Protein Products Salted, canned, smoked, spiced, or pickled meats, seafood, or fish. Bacon, ham, sausage, hot dogs, corned beef, chipped beef, and packaged luncheon meats. Salt pork. Jerky. Pickled herring. Anchovies, regular canned tuna, and sardines. Salted nuts. Dairy Processed cheese and cheese spreads. Cheese curds. Blue cheese and cottage cheese. Buttermilk.  Condiments Onion and garlic salt, seasoned salt, table salt, and sea salt. Canned and packaged gravies. Worcestershire sauce. Tartar sauce. Barbecue sauce. Teriyaki sauce. Soy sauce, including reduced sodium. Steak sauce. Fish sauce. Oyster sauce. Cocktail sauce. Horseradish. Regular ketchup and mustard. Meat flavorings and tenderizers. Bouillon cubes. Hot sauce. Tabasco sauce. Marinades. Taco seasonings. Relishes. Fats and Oils Regular salad dressings. Salted butter. Margarine. Ghee. Bacon fat.  Other Potato and tortilla chips. Corn chips and puffs. Salted popcorn and pretzels. Canned or dried soups. Pizza. Frozen entrees and pot pies.  The items listed above may not be a complete list of foods and beverages to avoid. Contact your dietitian for more information. Document Released: 04/22/2002 Document Revised: 11/05/2013 Document Reviewed: 09/04/2013 [facility] Patient Information 2015 [employer] [place]. This information is not intended to replace advice given to you by your health care provider. Make  sure you discuss any questions you have with your health care provider.

## 2015-02-02 NOTE — Assessment & Plan Note (Signed)
Stable without chest pain 

## 2015-02-02 NOTE — Progress Notes (Signed)
Cardiology Office Note   Date:  02/02/2015   ID:  Adrienne Bowman, DOB 04/06/1918, MRN [id number]  PCP:  Ruthe Mannan, MD  Cardiologist: Dr. Antoine Poche  Chief Complaint: Checkup    History of Present Illness: Adrienne Bowman is a 81 y.o. female who presents for six-month follow-up. Has a history of CAD status post CABG in 2002 with LIMA to the LAD, SVG to the OM1/OM 2, SVG to diagonal, SVG to PDA, distal RCA. He also has PVD with prior left leg above-the-knee amputation, right lower extremity revascularization, remote renal artery stenting followed by CVS, diabetes mellitus type 2, resistant hypertension, CKD, EF 45-50% on cath in 2002. He has been managed conservatively. She can't take ACEI or Aldactone due to progressive kidney disease. There is been some compliance issues in the past as she manages her own medications. She last saw Norma Fredrickson 07/2014 at which point her blood pressure was elevated. She was not taking them on a regular basis. Patient is resistant to letting anyone help her her oversee her medications and did not want to make any changes.  Patient comes in today accompanied by her granddaughter. She still lives alone and manages her own medications. She can't tell me about them here but says she recognizes the bottles at home. She says her blood pressure always runs high. She is quite active. She denies any chest pain, palpitations, dyspnea, dizziness or presyncope. She has an occasional headache. Lab work in the computer 12/18/14 BUN 71 creatinine 2.88 , GFR 19.58 hemoglobin A1c 6.5,similar to May 2015 .  Past Medical History  Diagnosis Date  . CHF (congestive heart failure) 2002    acute CHF while getting lower extremilty arteriograms /  Led to CABG  x6  . Hypertension     very difficult to control  . PVD (peripheral vascular disease)     extensive PVD / left AKA  . Murmur, heart     soft systolic outflow murmur and a grade 2/6 murmur of the aortic insufficiency  . S/P AKA  (above knee amputation)     left AKA with prosthesis  . DIABETES MELLITUS, TYPE II   . NEPHROPATHY, DIABETIC   . Coronary artery disease 2002    CABG x6 / left internal mammary artery graft to the LAD, a saphenous vein graft to the diagonal, a sequential saphenous vein graft to the 1st and 2nd obtuse marginal branches, and a sequential saphenous vein graft the the distal right coronary artery and posterior descending  . Anemia   . CKD (chronic kidney disease)   . Advanced age     Past Surgical History  Procedure Laterality Date  . Coronary artery bypass graft  2002    x6 per Dr. Laneta Simmers  . Eye surgery  1987 & 1988     left eye  . Vascular surgery      multiple peripheral vascular surgeries  . Left aka  2006  . Right renal artery stent  2004  . Rfpbpg  2002  . Lower extremity angiogram  February 21, 2012  . Pr vein bypass graft,aorto-fem-pop  Oct. 17, 2012    Right  Fem-Distal post. Tibial artery BPG.  . Lower extremity angiogram N/A 02/21/2012    Procedure: LOWER EXTREMITY ANGIOGRAM;  Surgeon: Nada Libman, MD;  Location: [facility] CATH LAB;  Service: Cardiovascular;  Laterality: N/A;  . Lower extremity angiogram Right 12/25/2012    Procedure: LOWER EXTREMITY ANGIOGRAM;  Surgeon: Nada Libman, MD;  Location: [facility]  CATH LAB;  Service: Cardiovascular;  Laterality: Right;  rt leg angio  . Abdominal angiogram  12/25/2012    Procedure: ABDOMINAL ANGIOGRAM;  Surgeon: Nada Libman, MD;  Location: [facility] CATH LAB;  Service: Cardiovascular;;     Current Outpatient Prescriptions  Medication Sig Dispense Refill  . amlodipine-atorvastatin (CADUET) 10-10 MG per tablet TAKE ONE TABLET BY MOUTH EVERY DAY 90 tablet 3  . aspirin 81 MG tablet Take 81 mg by mouth daily.      [name] atenolol (TENORMIN) 50 MG tablet TAKE ONE TABLET BY MOUTH ONCE DAILY 90 tablet 3  . cloNIDine (CATAPRES) 0.3 MG tablet TAKE ONE TABLET BY MOUTH TWICE DAILY 60 tablet 3  . famotidine (PEPCID) 20 MG tablet Take 1 tablet (20 mg total) by  mouth 2 (two) times daily. 60 tablet 6  . furosemide (LASIX) 40 MG tablet Take 1 tablet (40 mg total) by mouth daily. 30 tablet 5  . glyBURIDE (DIABETA) 1.25 MG tablet Take 1 tablet (1.25 mg total) by mouth daily with breakfast. Needs an appointment with Dr and labs, for any additional refills. 30 tablet 6  . hydrALAZINE (APRESOLINE) 100 MG tablet TAKE ONE TABLET BY MOUTH THREE TIMES DAILY 90 tablet 6  . multivitamin (THERAGRAN) per tablet Take 1 tablet by mouth daily.       No current facility-administered medications for this visit.    Allergies:   Review of patient's allergies indicates no known allergies.    Social History:  The patient  reports that she has never smoked. She has never used smokeless tobacco. She reports that she does not drink alcohol or use illicit drugs.   Family History:  The patient's family history includes Coronary artery disease in an other family member; Heart disease in her mother; Hypertension in her daughter.    ROS:  Please see the history of present illness.   Otherwise, review of systems are positive for hard of hearing, walks with a walker, wears glasses, arthritic.   All other systems are reviewed and negative.    PHYSICAL EXAM: BP 206/58 mmHg  Pulse 76  Ht  (1.626 m)  Wt 110 lb (49.896 kg)  BMI 18.87 kg/m2 GEN: Well nourished, well developed, in no acute distress Neck: Bilateral carotid bruits right greater than left no JVD, HJR,  or masses Cardiac:RRR; positive S4, 2/6 systolic murmur at the left sternal border, no rubs, thrill or heave,no edema,   Respiratory:  Decreased breath sounds throughout clear to auscultation bilaterally, normal work of breathing GI: soft, nontender, nondistended, + BS MS: no deformity or atrophy Extremities: without cyanosis, clubbing, edema, good distal pulses bilaterally.  Skin: warm and dry, no rash    EKG:  EKG is not ordered today.    Recent Labs: 12/18/2014: ALT 18; BUN 71*; Creatinine 2.88*;  Potassium 4.2; Sodium 138    Lipid Panel    Component Value Date/Time   CHOL 126 01/24/2014 1048   TRIG 159.0* 01/24/2014 1048   HDL 41.20 01/24/2014 1048   CHOLHDL 3 01/24/2014 1048   VLDL 31.8 01/24/2014 1048   LDLCALC 53 01/24/2014 1048   LDLDIRECT 59.6 08/16/2012 1010      Wt Readings from Last 3 Encounters:  12/18/14 115 lb (52.164 kg)  08/13/14 115 lb 6.4 oz (52.345 kg)  08/12/14 112 lb (50.803 kg)      Other studies Reviewed: Additional studies/ records that were reviewed today include and review of the records demonstrates:  Carotid Dopplers 01/2014 less than 40% ICA  stenosis  ASSESSMENT AND PLAN:  Essential hypertension Patient's blood pressure is elevated today. It seems to be a chronic problem for her. She is [age], lives alone and does not want to change her medications at this point. Follow-up with Dr. Antoine PocheHochrein in 6 months, Norma FredricksonLori Gerhardt, NP in 3 months   Chronic kidney disease Most recent creatinine 2.88. Similar to May 2015.   Occlusion and stenosis of carotid artery without mention of cerebral infarction Carotid Dopplers reviewed from 01/2014 less than 40% ICA stenosis.   CAD (coronary artery disease) Stable without chest pain.   Congestive heart failure No evidence of heart failure and exam.     Signed, Jacolyn ReedyMichele Lauria Depoy, PA-C  02/02/2015 11:12 AM    [facility] [address], [place], [place]  [address] Phone: [phone]; Fax: [phone]

## 2015-02-02 NOTE — Assessment & Plan Note (Signed)
Most recent creatinine 2.88. Similar to May 2015.

## 2015-02-03 ENCOUNTER — Ambulatory Visit: Payer: Medicare Other | Admitting: Family

## 2015-02-03 ENCOUNTER — Other Ambulatory Visit (HOSPITAL_COMMUNITY): Payer: Medicare Other

## 2015-02-11 ENCOUNTER — Ambulatory Visit: Payer: Medicare Other | Admitting: Nurse Practitioner

## 2015-02-17 ENCOUNTER — Ambulatory Visit: Payer: Medicare Other | Admitting: Family

## 2015-02-17 ENCOUNTER — Encounter (HOSPITAL_COMMUNITY): Payer: Medicare Other

## 2015-02-17 ENCOUNTER — Other Ambulatory Visit (HOSPITAL_COMMUNITY): Payer: Medicare Other

## 2015-02-19 ENCOUNTER — Encounter: Payer: Self-pay | Admitting: Family

## 2015-02-20 ENCOUNTER — Ambulatory Visit (HOSPITAL_COMMUNITY)
Admission: RE | Admit: 2015-02-20 | Discharge: 2015-02-20 | Disposition: A | Discharge status: Home / Self Care | Payer: Medicare Other | Source: Ambulatory Visit | Attending: Family | Admitting: Family

## 2015-02-20 ENCOUNTER — Encounter: Payer: Self-pay | Admitting: Family

## 2015-02-20 ENCOUNTER — Ambulatory Visit (INDEPENDENT_AMBULATORY_CARE_PROVIDER_SITE_OTHER)
Admission: RE | Admit: 2015-02-20 | Discharge: 2015-02-20 | Disposition: A | Discharge status: Home / Self Care | Payer: Medicare Other | Source: Ambulatory Visit | Attending: Family | Admitting: Family

## 2015-02-20 ENCOUNTER — Ambulatory Visit (INDEPENDENT_AMBULATORY_CARE_PROVIDER_SITE_OTHER): Payer: Medicare Other | Admitting: Family

## 2015-02-20 VITALS — BP 184/62 | HR 63 | Resp 16 | Ht 64.0 in | Wt 110.0 lb

## 2015-02-20 DIAGNOSIS — Z48812 Encounter for surgical aftercare following surgery on the circulatory system: Secondary | ICD-10-CM

## 2015-02-20 DIAGNOSIS — I739 Peripheral vascular disease, unspecified: Secondary | ICD-10-CM | POA: Insufficient documentation

## 2015-02-20 DIAGNOSIS — I6523 Occlusion and stenosis of bilateral carotid arteries: Secondary | ICD-10-CM

## 2015-02-20 DIAGNOSIS — Z9889 Other specified postprocedural states: Secondary | ICD-10-CM

## 2015-02-20 DIAGNOSIS — Z89612 Acquired absence of left leg above knee: Secondary | ICD-10-CM | POA: Diagnosis not present

## 2015-02-20 DIAGNOSIS — Z95828 Presence of other vascular implants and grafts: Secondary | ICD-10-CM

## 2015-02-20 NOTE — Patient Instructions (Signed)
Peripheral Vascular Disease Peripheral Vascular Disease (PVD), also called Peripheral Arterial Disease (PAD), is a circulation problem caused by cholesterol (atherosclerotic plaque) deposits in the arteries. PVD commonly occurs in the lower extremities (legs) but it can occur in other areas of the body, such as your arms. The cholesterol buildup in the arteries reduces blood flow which can cause pain and other serious problems. The presence of PVD can place a person at risk for Coronary Artery Disease (CAD).  CAUSES  Causes of PVD can be many. It is usually associated with more than one risk factor such as:   High Cholesterol.  Smoking.  Diabetes.  Lack of exercise or inactivity.  High blood pressure (hypertension).  Obesity.  Family history. SYMPTOMS   When the lower extremities are affected, patients with PVD may experience:  Leg pain with exertion or physical activity. This is called INTERMITTENT CLAUDICATION. This may present as cramping or numbness with physical activity. The location of the pain is associated with the level of blockage. For example, blockage at the abdominal level (distal abdominal aorta) may result in buttock or hip pain. Lower leg arterial blockage may result in calf pain.  As PVD becomes more severe, pain can develop with less physical activity.  In people with severe PVD, leg pain may occur at rest.  Other PVD signs and symptoms:  Leg numbness or weakness.  Coldness in the affected leg or foot, especially when compared to the other leg.  A change in leg color.  Patients with significant PVD are more prone to ulcers or sores on toes, feet or legs. These may take longer to heal or may reoccur. The ulcers or sores can become infected.  If signs and symptoms of PVD are ignored, gangrene may occur. This can result in the loss of toes or loss of an entire limb.  Not all leg pain is related to PVD. Other medical conditions can cause leg pain such  as:  Blood clots (embolism) or Deep Vein Thrombosis.  Inflammation of the blood vessels (vasculitis).  Spinal stenosis. DIAGNOSIS  Diagnosis of PVD can involve several different types of tests. These can include:  Pulse Volume Recording Method (PVR). This test is simple, painless and does not involve the use of X-rays. PVR involves measuring and comparing the blood pressure in the arms and legs. An ABI (Ankle-Brachial Index) is calculated. The normal ratio of blood pressures is 1. As this number becomes smaller, it indicates more severe disease.  < 0.95 - indicates significant narrowing in one or more leg vessels.  <0.8 - there will usually be pain in the foot, leg or buttock with exercise.  <0.4 - will usually have pain in the legs at rest.  <0.25 - usually indicates limb threatening PVD.  Doppler detection of pulses in the legs. This test is painless and checks to see if you have a pulses in your legs/feet.  A dye or contrast material (a substance that highlights the blood vessels so they show up on x-ray) may be given to help your caregiver better see the arteries for the following tests. The dye is eliminated from your body by the kidney's. Your caregiver may order blood work to check your kidney function and other laboratory values before the following tests are performed:  Magnetic Resonance Angiography (MRA). An MRA is a picture study of the blood vessels and arteries. The MRA machine uses a large magnet to produce images of the blood vessels.  Computed Tomography Angiography (CTA). A CTA   is a specialized x-ray that looks at how the blood flows in your blood vessels. An IV may be inserted into your arm so contrast dye can be injected.  Angiogram. Is a procedure that uses x-rays to look at your blood vessels. This procedure is minimally invasive, meaning a small incision (cut) is made in your groin. A small tube (catheter) is then inserted into the artery of your groin. The catheter  is guided to the blood vessel or artery your caregiver wants to examine. Contrast dye is injected into the catheter. X-rays are then taken of the blood vessel or artery. After the images are obtained, the catheter is taken out. TREATMENT  Treatment of PVD involves many interventions which may include:  Lifestyle changes:  Quitting smoking.  Exercise.  Following a low fat, low cholesterol diet.  Control of diabetes.  Foot care is very important to the PVD patient. Good foot care can help prevent infection.  Medication:  Cholesterol-lowering medicine.  Blood pressure medicine.  Anti-platelet drugs.  Certain medicines may reduce symptoms of Intermittent Claudication.  Interventional/Surgical options:  Angioplasty. An Angioplasty is a procedure that inflates a balloon in the blocked artery. This opens the blocked artery to improve blood flow.  Stent Implant. A wire mesh tube (stent) is placed in the artery. The stent expands and stays in place, allowing the artery to remain open.  Peripheral Bypass Surgery. This is a surgical procedure that reroutes the blood around a blocked artery to help improve blood flow. This type of procedure may be performed if Angioplasty or stent implants are not an option. SEEK IMMEDIATE MEDICAL CARE IF:   You develop pain or numbness in your arms or legs.  Your arm or leg turns cold, becomes blue in color.  You develop redness, warmth, swelling and pain in your arms or legs. MAKE SURE YOU:   Understand these instructions.  Will watch your condition.  Will get help right away if you are not doing well or get worse. Document Released: 12/08/2004 Document Revised: 01/23/2012 Document Reviewed: 11/04/2008 Exit[employer] Patient Information 2015 [employer]. This information is not intended to replace advice given to you by your health care provider. Make sure you discuss any questions you have with your health care provider.   Stroke  Prevention Some medical conditions and behaviors are associated with an increased chance of having a stroke. You may prevent a stroke by making healthy choices and managing medical conditions. HOW CAN I REDUCE MY RISK OF HAVING A STROKE?   Stay physically active. Get at least 30 minutes of activity on most or all days.  Do not smoke. It may also be helpful to avoid exposure to secondhand smoke.  Limit alcohol use. Moderate alcohol use is considered to be:  No more than 2 drinks per day for men.  No more than 1 drink per day for nonpregnant women.  Eat healthy foods. This involves:  Eating 5 or more servings of fruits and vegetables a day.  Making dietary changes that address high blood pressure (hypertension), high cholesterol, diabetes, or obesity.  Manage your cholesterol levels.  Making food choices that are high in fiber and low in saturated fat, trans fat, and cholesterol may control cholesterol levels.  Take any prescribed medicines to control cholesterol as directed by your health care provider.  Manage your diabetes.  Controlling your carbohydrate and sugar intake is recommended to manage diabetes.  Take any prescribed medicines to control diabetes as directed by your health care provider.    Control your hypertension.  Making food choices that are low in salt (sodium), saturated fat, trans fat, and cholesterol is recommended to manage hypertension.  Take any prescribed medicines to control hypertension as directed by your health care provider.  Maintain a healthy weight.  Reducing calorie intake and making food choices that are low in sodium, saturated fat, trans fat, and cholesterol are recommended to manage weight.  Stop drug abuse.  Avoid taking birth control pills.  Talk to your health care provider about the risks of taking birth control pills if you are over 35 years old, smoke, get migraines, or have ever had a blood clot.  Get evaluated for sleep  disorders (sleep apnea).  Talk to your health care provider about getting a sleep evaluation if you snore a lot or have excessive sleepiness.  Take medicines only as directed by your health care provider.  For some people, aspirin or blood thinners (anticoagulants) are helpful in reducing the risk of forming abnormal blood clots that can lead to stroke. If you have the irregular heart rhythm of atrial fibrillation, you should be on a blood thinner unless there is a good reason you cannot take them.  Understand all your medicine instructions.  Make sure that other conditions (such as anemia or atherosclerosis) are addressed. SEEK IMMEDIATE MEDICAL CARE IF:   You have sudden weakness or numbness of the face, arm, or leg, especially on one side of the body.  Your face or eyelid droops to one side.  You have sudden confusion.  You have trouble speaking (aphasia) or understanding.  You have sudden trouble seeing in one or both eyes.  You have sudden trouble walking.  You have dizziness.  You have a loss of balance or coordination.  You have a sudden, severe headache with no known cause.  You have new chest pain or an irregular heartbeat. Any of these symptoms may represent a serious problem that is an emergency. Do not wait to see if the symptoms will go away. Get medical help at once. Call your local emergency services (911 in [place] Do not drive yourself to the hospital. Document Released: 12/08/2004 Document Revised: 03/17/2014 Document Reviewed: 05/03/2013 Exit[employer] Patient Information 2015 [employer]. This information is not intended to replace advice given to you by your health care provider. Make sure you discuss any questions you have with your health care provider.  

## 2015-02-20 NOTE — Progress Notes (Signed)
Filed Vitals:   02/20/15 1432 02/20/15 1442  BP: 198/69 184/62  Pulse: 67 63  Resp: 16   Height: 5\' 4"  (1.626 m)   Weight: 110 lb (49.896 kg)   SpO2: 100%

## 2015-02-20 NOTE — Progress Notes (Signed)
[facility] [place] HISTORY AND PHYSICAL   MRN : [id number]  History of Present Illness:   Adrienne Bowman is a 81 y.o. female [id number] is s/p right femoral to distal posterior tibial saphenous vein graft in 2012 by Dr. Hart RochesterLawson. She had evidence of near occlusion at the distal anastomosis and angiogram was performed by Dr. Myra GianottiBrabham on 12/25/12. This revealed total occlusion of the outflow vessel with patency of the graft through collateral branches. There was no potential for revision. She had a previous left AKA (2006). Right great toe and second toe amputations 2002.  She returns today for follow up.  Pt. denies claudication at RLE.  Pt. denies rest pain;  denies night pain  denies non healing ulcers on right lower extremity or left AKA stump.  The patient denies New Medical or Surgical History.  She denies history of stroke or TIA, reports MI many years ago.  Baird CancerLaurie Bowman replaced Dr. Ruben Gottronenent as her cardiologist; Dr. Ruben Gottronenent requested an MRA of renal arteries in 2006, she has a right renal artery stent.  Pt states her blood pressure is always this high, she is taking 4 antihypertensive medications.    Pt Diabetic: Yes , states in good control  Pt smoker: non-smoker   Pt meds include:  Statin :Yes  Betablocker: Yes  ASA: Yes  Other anticoagulants/antiplatelets: no      Current Outpatient Prescriptions  Medication Sig Dispense Refill  . amlodipine-atorvastatin (CADUET) 10-10 MG per tablet TAKE ONE TABLET BY MOUTH EVERY DAY 90 tablet 3  . aspirin 81 MG tablet Take 81 mg by mouth daily.      [name]. atenolol (TENORMIN) 50 MG tablet TAKE ONE TABLET BY MOUTH ONCE DAILY 90 tablet 3  . cloNIDine (CATAPRES) 0.3 MG tablet TAKE ONE TABLET BY MOUTH TWICE DAILY 60 tablet 3  . famotidine (PEPCID) 20 MG tablet Take 1 tablet (20 mg total) by mouth 2 (two) times daily. 60 tablet 6  . furosemide (LASIX) 40 MG tablet Take 1 tablet (40 mg total) by mouth daily. 30 tablet 5   . glyBURIDE (DIABETA) 1.25 MG tablet Take 1 tablet (1.25 mg total) by mouth daily with breakfast. Needs an appointment with Dr and labs, for any additional refills. 30 tablet 6  . hydrALAZINE (APRESOLINE) 100 MG tablet TAKE ONE TABLET BY MOUTH THREE TIMES DAILY 90 tablet 6  . multivitamin (THERAGRAN) per tablet Take 1 tablet by mouth daily.       No current facility-administered medications for this visit.    Past Medical History  Diagnosis Date  . CHF (congestive heart failure) 2002    acute CHF while getting lower extremilty arteriograms /  Led to CABG  x6  . Hypertension     very difficult to control  . PVD (peripheral vascular disease)     extensive PVD / left AKA  . Murmur, heart     soft systolic outflow murmur and a grade 2/6 murmur of the aortic insufficiency  . S/P AKA (above knee amputation)     left AKA with prosthesis  . DIABETES MELLITUS, TYPE II   . NEPHROPATHY, DIABETIC   . Coronary artery disease 2002    CABG x6 / left internal mammary artery graft to the LAD, a saphenous vein graft to the diagonal, a sequential saphenous vein graft to the 1st and 2nd obtuse marginal branches, and a sequential saphenous vein graft the the distal right coronary artery and posterior descending  . Anemia   .  CKD (chronic kidney disease)   . Advanced age     Social History History  Substance Use Topics  . Smoking status: Never Smoker   . Smokeless tobacco: Never Used  . Alcohol Use: No    Family History Family History  Problem Relation Age of Onset  . Coronary artery disease      prevalent in sibling  . Heart disease Mother   . Hypertension Daughter   . Cancer Son     Lung  and  Throat  . Heart attack Son   . Hyperlipidemia Daughter   . Hypertension Daughter   . Hypertension Daughter   . Hypertension Daughter     Surgical History Past Surgical History  Procedure Laterality Date  . Coronary artery bypass graft  2002    x6 per Dr. Laneta Simmers  . Eye surgery  1987 & 1988      left eye  . Vascular surgery      multiple peripheral vascular surgeries  . Left aka  2006  . Right renal artery stent  2004  . Rfpbpg  2002  . Lower extremity angiogram  February 21, 2012  . Pr vein bypass graft,aorto-fem-pop  Oct. 17, 2012    Right  Fem-Distal post. Tibial artery BPG.  . Lower extremity angiogram N/A 02/21/2012    Procedure: LOWER EXTREMITY ANGIOGRAM;  Surgeon: Nada Libman, MD;  Location: [facility] CATH LAB;  Service: Cardiovascular;  Laterality: N/A;  . Lower extremity angiogram Right 12/25/2012    Procedure: LOWER EXTREMITY ANGIOGRAM;  Surgeon: Nada Libman, MD;  Location: [facility] CATH LAB;  Service: Cardiovascular;  Laterality: Right;  rt leg angio  . Abdominal angiogram  12/25/2012    Procedure: ABDOMINAL ANGIOGRAM;  Surgeon: Nada Libman, MD;  Location: [facility] CATH LAB;  Service: Cardiovascular;;    No Known Allergies  Current Outpatient Prescriptions  Medication Sig Dispense Refill  . amlodipine-atorvastatin (CADUET) 10-10 MG per tablet TAKE ONE TABLET BY MOUTH EVERY DAY 90 tablet 3  . aspirin 81 MG tablet Take 81 mg by mouth daily.      [name] atenolol (TENORMIN) 50 MG tablet TAKE ONE TABLET BY MOUTH ONCE DAILY 90 tablet 3  . cloNIDine (CATAPRES) 0.3 MG tablet TAKE ONE TABLET BY MOUTH TWICE DAILY 60 tablet 3  . famotidine (PEPCID) 20 MG tablet Take 1 tablet (20 mg total) by mouth 2 (two) times daily. 60 tablet 6  . furosemide (LASIX) 40 MG tablet Take 1 tablet (40 mg total) by mouth daily. 30 tablet 5  . glyBURIDE (DIABETA) 1.25 MG tablet Take 1 tablet (1.25 mg total) by mouth daily with breakfast. Needs an appointment with Dr and labs, for any additional refills. 30 tablet 6  . hydrALAZINE (APRESOLINE) 100 MG tablet TAKE ONE TABLET BY MOUTH THREE TIMES DAILY 90 tablet 6  . multivitamin (THERAGRAN) per tablet Take 1 tablet by mouth daily.       No current facility-administered medications for this visit.     REVIEW OF SYSTEMS: See HPI for pertinent positives and  negatives.  Physical Examination Filed Vitals:   02/20/15 1432 02/20/15 1442  BP: 198/69 184/62  Pulse: 67 63  Resp: 16   Height:  (1.626 m)   Weight: 110 lb (49.896 kg)   SpO2: 100%    Body mass index is 18.87 kg/(m^2).  General: A&O x 3, WDWN,  Gait: with walker, slow and deliberate.  Eyes: PERRLA,  Pulmonary: CTAB, without wheezes , rales or rhonchi  Cardiac: regular  Rythm , with murmur   Carotid Bruits  Left  Right    Positive  Positive   VASCULAR EXAM:  Extremities without ischemic changes, left AKA, wearing left LE prosthesis  without Gangrene; without open wounds.   LE Pulses  LEFT  RIGHT   FEMORAL  Not palpable  palpable   POPLITEAL  AKA  not palpable   POSTERIOR TIBIAL  AKA  2+palpable   DORSALIS PEDIS  ANTERIOR TIBIAL  AKA  not palpable, monophasic by Doppler    Abdomen: soft, NT, no palpable masses  Skin: no rashes, no ulcers Musculoskeletal: no muscle wasting or atrophy. Right great toe and second toe surgically absent. Left AKA with prosthesis in place. No right LE edema. Neurologic: A&O X 3; Appropriate Affect ; SENSATION: normal; MOTOR FUNCTION: moving all extremities equally. Speech is fluent/normal    Significant Diagnostic Studies:  2006 MRA abdomen requested by Dr. Ruben Gottron:  1. Interval right renal artery stent. The stented portion of this vessel cannot be evaluated due to ferromagnetic artifact. The vessel beyond the stent is patent.  2. The left renal artery is patent.  3. Stable disease of the SMA and IMA.         Non-Invasive Vascular Imaging (02/20/2015):   LOWER EXTREMITY ARTERIAL DUPLEX EVALUATION    INDICATION: Peripheral Vascular Disease     PREVIOUS INTERVENTION(S): Right femoral to distal posterior tibial artery placed 08/30/2001; Left AKA 2006; Right 1st and 2nd toe amputation    DUPLEX EXAM:     RIGHT  LEFT   Peak Systolic Velocity (cm/s) Ratio (if abnormal) Waveform  Peak Systolic  Velocity (cm/s) Ratio (if abnormal) Waveform  97  T Inflow Artery     72  B Proximal Anastomosis     31  B Proximal Graft     36  B Mid Graft     39  B  Distal Graft     61/90/104  B/B/B Distal Anastomosis     437/0  stenotic/ occluded Outflow Artery     0.90 Today's ABI / TBI AKA  0.94 Previous ABI / TBI (08/12/2014  ) AKA    Waveform:    M - Monophasic       B - Biphasic       T - Triphasic  If Ankle Brachial Index (ABI) or Toe Brachial Index (TBI) performed, please see complete report  ADDITIONAL FINDINGS:     IMPRESSION: Patent right femoral to distal posterior tibial artery bypass graft, mild to moderate homogeneous plaque present at the distal anastomosis which is not hemodynamically signficant. A greater than 50% stenosis present involving the right posterior tibial artery outflow with absent flow distal to stenosis suggestive of vessel occlusion.    Compared to the previous exam:  Minimal decrease of right ankle brachial index since study on 08/12/2014.      CEREBROVASCULAR DUPLEX EVALUATION    INDICATION: Carotid stenosis    PREVIOUS INTERVENTION(S): NA    DUPLEX EXAM:     RIGHT  LEFT  Peak Systolic Velocities (cm/s) End Diastolic Velocities (cm/s) Plaque LOCATION Peak Systolic Velocities (cm/s) End Diastolic Velocities (cm/s) Plaque  251 0  CCA PROXIMAL 91 7 HT  140 0 HT CCA MID 178 11 CP  135 8 HT CCA DISTAL 187 17 CP  239 0 CP ECA 382 0 CP  114 15 HT ICA PROXIMAL 195 22 CP  132 16 HT ICA MID 144 23 HT  97 14  ICA DISTAL 128 20  0.81 ICA / CCA Ratio (PSV) 1.10  Antegrade Vertebral Flow Antegrade  175 Brachial Systolic Pressure (mmHg) 176  Triphasic Brachial Artery Waveforms Triphasic    Plaque Morphology:  HM = Homogeneous, HT = Heterogeneous, CP = Calcific Plaque, SP = Smooth Plaque, IP = Irregular Plaque  ADDITIONAL FINDINGS:     IMPRESSION: Bilateral common carotid artery disease present of less than 50%. Bilateral internal carotid artery stenosis  present of less than 40%, which may be underestimated due to calcific plaque present making Doppler interrogation difficult. Bilateral external carotid artery stenosis present.    Compared to the previous exam:  Essentially unchanged since previous study on 01/28/2014.     ASSESSMENT:  Adrienne Bowman is a 81 y.o. female who is s/p right femoral to distal posterior tibial artery placed 08/30/2001; Left AKA 2006; Right 1st and 2nd toe amputation. She does her own house work, she occasionally has right anterior thigh claudication if she is more active than usual. She has no tissue loss. Today's right LE arterial Duplex suggests a patent right femoral to distal posterior tibial artery bypass graft, mild to moderate homogeneous plaque present at the distal anastomosis which is not hemodynamically signficant. A greater than 50% stenosis present involving the right posterior tibial artery outflow with absent flow distal to stenosis suggestive of vessel occlusion.  She has bilateral carotid bruits with no history of stroke or TIA. Today's carotid Duplex suggests bilateral common carotid artery disease present of less than 50%. Bilateral internal carotid artery stenosis present of less than 40%, which may be underestimated due to calcific plaque present making Doppler interrogation difficult. Bilateral external carotid artery stenosis present which accounts for the bilateral carotid bruits. Essentially unchanged since previous study on 01/28/2014.   PLAN:   Pt advised to follow up with her PCP re her elevated blood pressure.  Based on today's exam and non-invasive vascular lab results,and after discussing with Dr. Imogene Burn, the patient will follow up in 1-4 weeks with Dr. Hart Rochester to discuss the implications for her of the 437 cm/s velocity in the right ankle. I discussed in depth with the patient the nature of atherosclerosis, and emphasized the importance of maximal medical management including strict  control of blood pressure, blood glucose, and lipid levels, obtaining regular exercise, and cessation of smoking.  The patient is aware that without maximal medical management the underlying atherosclerotic disease process will progress, limiting the benefit of any interventions.  The patient was given information about stroke prevention and what symptoms should prompt the patient to seek immediate medical care.  The patient was given information about PAD including signs, symptoms, treatment, what symptoms should prompt the patient to seek immediate medical care, and risk reduction measures to take. Thank you for allowing [place] to participate in this patient's care.  Charisse March, RN, MSN, FNP-C [facility] Office: [phone]  Clinic MD: Imogene Burn  02/20/2015 2:44 PM

## 2015-03-04 ENCOUNTER — Other Ambulatory Visit: Payer: Self-pay | Admitting: *Deleted

## 2015-03-04 MED ORDER — ATENOLOL 50 MG PO TABS: ORAL_TABLET | ORAL | Status: DC

## 2015-03-05 ENCOUNTER — Ambulatory Visit: Payer: Medicare Other | Admitting: Cardiology

## 2015-03-05 ENCOUNTER — Other Ambulatory Visit: Payer: Self-pay

## 2015-03-05 MED ORDER — HYDRALAZINE HCL 100 MG PO TABS
100.0000 mg | ORAL_TABLET | Freq: Three times a day (TID) | ORAL | Status: DC
Start: 2015-03-05 — End: 2015-07-14

## 2015-03-05 NOTE — Telephone Encounter (Signed)
Dyann KiefMichele M Lenze, PA-C at 02/02/2015 11:12 AM  hydrALAZINE (APRESOLINE) 100 MG tablet TAKE ONE TABLET BY MOUTH THREE TIMES DAILY          Patient Instructions     Your physician recommends that you continue on your current medications as directed. Please refer to the Current Medication list given to you today.

## 2015-03-09 ENCOUNTER — Encounter: Payer: Self-pay | Admitting: Vascular Surgery

## 2015-03-10 ENCOUNTER — Ambulatory Visit: Payer: Medicare Other | Admitting: Vascular Surgery

## 2015-03-10 ENCOUNTER — Telehealth: Payer: Self-pay | Admitting: Vascular Surgery

## 2015-03-10 NOTE — Telephone Encounter (Signed)
Patient arrived today at 1:00pm for check in. Her scheduled appointment was for 3:00pm. She stated that someone here called her and told her to be here at 1:00. When we offered to work her in, she left stating that we lied to her and that she didn't want us calling her anymore, to tell her lies about her appointments. She stated that she was in her right mind and she KNOWS we told her 1:00pm. I apologized for any miscommunication and offered to talk to the RN about getting her back as soon as we could, but she left without being seen. Daughter stated that she would call me later to reschedule, dpm

## 2015-03-30 ENCOUNTER — Other Ambulatory Visit: Payer: Self-pay

## 2015-03-30 MED ORDER — CLONIDINE HCL 0.3 MG PO TABS: ORAL_TABLET | ORAL | Status: DC

## 2015-03-30 NOTE — Telephone Encounter (Signed)
Adrienne KiefMichele M Lenze, PA-C at 02/02/2015 11:12 AM  cloNIDine (CATAPRES) 0.3 MG tabletTAKE ONE TABLET BY MOUTH TWICE DAILY Essential hypertension Patient's blood pressure is elevated today. It seems to be a chronic problem for her. She is [age], lives alone and does not want to change her medications at this point. Follow-up with Adrienne Bowman in 6 months, Adrienne FredricksonLori Gerhardt, NP in 3 months

## 2015-04-24 ENCOUNTER — Ambulatory Visit (INDEPENDENT_AMBULATORY_CARE_PROVIDER_SITE_OTHER): Payer: Medicare Other | Admitting: Cardiology

## 2015-04-24 ENCOUNTER — Encounter: Payer: Self-pay | Admitting: Vascular Surgery

## 2015-04-24 ENCOUNTER — Encounter: Payer: Self-pay | Admitting: Cardiology

## 2015-04-24 VITALS — BP 180/52 | HR 69 | Ht 65.0 in | Wt 97.0 lb

## 2015-04-24 DIAGNOSIS — I6523 Occlusion and stenosis of bilateral carotid arteries: Secondary | ICD-10-CM | POA: Diagnosis not present

## 2015-04-24 DIAGNOSIS — I10 Essential (primary) hypertension: Secondary | ICD-10-CM | POA: Diagnosis not present

## 2015-04-24 NOTE — Progress Notes (Signed)
Cardiology Office Note   Date:  04/24/2015   ID:  Adrienne Bowman, DOB [birth year], MRN [id number]  PCP:  Ruthe Mannan, MD  Cardiologist:   Rollene Rotunda, MD   Chief Complaint  Patient presents with  . Follow-up    Patient has no complaints.      History of Present Illness: Adrienne Bowman is a 81 y.o. female who presents for the patient presents for follow-up of coronary artery disease status post CABG. There has been some issue with compliance as she manages her own medications.  Despite only having one leg and be 81 years old she actually does pretty well. She says she does all of her own chores. She pushes a vacuum cleaner. With this she denies any cardiovascular symptoms. The patient denies any new symptoms such as chest discomfort, neck or arm discomfort. There has been no new shortness of breath, PND or orthopnea. There have been no reported palpitations, presyncope or syncope.    Past Medical History  Diagnosis Date  . CHF (congestive heart failure) 2002    acute CHF while getting lower extremilty arteriograms /  Led to CABG  x6  . Hypertension     very difficult to control  . PVD (peripheral vascular disease)     extensive PVD / left AKA  . Murmur, heart     soft systolic outflow murmur and a grade 2/6 murmur of the aortic insufficiency  . S/P AKA (above knee amputation)     left AKA with prosthesis  . DIABETES MELLITUS, TYPE II   . NEPHROPATHY, DIABETIC   . Coronary artery disease 2002    CABG x6 / left internal mammary artery graft to the LAD, a saphenous vein graft to the diagonal, a sequential saphenous vein graft to the 1st and 2nd obtuse marginal branches, and a sequential saphenous vein graft the the distal right coronary artery and posterior descending  . Anemia   . CKD (chronic kidney disease)   . Advanced age     Past Surgical History  Procedure Laterality Date  . Coronary artery bypass graft  2002    x6 per Dr. Laneta Simmers  . Eye surgery  1987 & 1988    left eye  . Vascular surgery      multiple peripheral vascular surgeries  . Left aka  2006  . Right renal artery stent  2004  . Rfpbpg  2002  . Lower extremity angiogram  February 21, 2012  . Pr vein bypass graft,aorto-fem-pop  Oct. 17, 2012    Right  Fem-Distal post. Tibial artery BPG.  . Lower extremity angiogram N/A 02/21/2012    Procedure: LOWER EXTREMITY ANGIOGRAM;  Surgeon: Nada Libman, MD;  Location: [facility] CATH LAB;  Service: Cardiovascular;  Laterality: N/A;  . Lower extremity angiogram Right 12/25/2012    Procedure: LOWER EXTREMITY ANGIOGRAM;  Surgeon: Nada Libman, MD;  Location: [facility] CATH LAB;  Service: Cardiovascular;  Laterality: Right;  rt leg angio  . Abdominal angiogram  12/25/2012    Procedure: ABDOMINAL ANGIOGRAM;  Surgeon: Nada Libman, MD;  Location: [facility] CATH LAB;  Service: Cardiovascular;;     Current Outpatient Prescriptions  Medication Sig Dispense Refill  . amlodipine-atorvastatin (CADUET) 10-10 MG per tablet TAKE ONE TABLET BY MOUTH EVERY DAY 90 tablet 3  . aspirin 81 MG tablet Take 81 mg by mouth daily.      [name] atenolol (TENORMIN) 50 MG tablet TAKE ONE TABLET BY MOUTH ONCE DAILY 90  tablet 0  . cloNIDine (CATAPRES) 0.3 MG tablet TAKE ONE TABLET BY MOUTH TWICE DAILY 60 tablet 3  . famotidine (PEPCID) 20 MG tablet Take 1 tablet (20 mg total) by mouth 2 (two) times daily. 60 tablet 6  . furosemide (LASIX) 40 MG tablet Take 1 tablet (40 mg total) by mouth daily. 30 tablet 5  . glyBURIDE (DIABETA) 1.25 MG tablet Take 1 tablet (1.25 mg total) by mouth daily with breakfast. Needs an appointment with Dr and labs, for any additional refills. 30 tablet 6  . hydrALAZINE (APRESOLINE) 100 MG tablet Take 1 tablet (100 mg total) by mouth 3 (three) times daily. 90 tablet 3  . multivitamin (THERAGRAN) per tablet Take 1 tablet by mouth daily.       No current facility-administered medications for this visit.    Allergies:   Review of patient's allergies indicates no known  allergies.    ROS:  Please see the history of present illness.   Otherwise, review of systems are positive for none.   All other systems are reviewed and negative.    PHYSICAL EXAM: VS:  BP 180/52 mmHg  Pulse 69  Ht  (1.651 m)  Wt 97 lb (43.999 kg)  BMI 16.14 kg/m2 , BMI Body mass index is 16.14 kg/(m^2). GENERAL:  Well appearing, thin NECK:  No jugular venous distention, waveform within normal limits, carotid upstroke brisk and symmetric, bilateral bruits, no thyromegaly LUNGS:  Clear to auscultation bilaterally BACK:  No CVA tenderness CHEST:  Unremarkable HEART:  PMI not displaced or sustained,S1 and S2 within normal limits, no S3, no S4, no clicks, no rubs, soft apical systolic murmurs ABD:  Flat, positive bowel sounds normal in frequency in pitch, no bruits, no rebound, no guarding, no midline pulsatile mass, no hepatomegaly, no splenomegaly EXT:  2 plus pulses throughout, no edema, no cyanosis no clubbing, left leg amputation, right great and second toe amputation, femoral bruits   EKG:  EKG is ordered today. The ekg ordered today demonstrates sinus rhythm, rate 69, axis within normal limits, poor anterior R wave progression, left atrial enlargement, inferolateral T wave inversions consistent with ischemia but unchanged from previous.  04/24/2015    Recent Labs: 12/18/2014: ALT 18; BUN 71*; Creatinine, Ser 2.88*; Potassium 4.2; Sodium 138    Lipid Panel    Component Value Date/Time   CHOL 126 01/24/2014 1048   TRIG 159.0* 01/24/2014 1048   HDL 41.20 01/24/2014 1048   CHOLHDL 3 01/24/2014 1048   VLDL 31.8 01/24/2014 1048   LDLCALC 53 01/24/2014 1048   LDLDIRECT 59.6 08/16/2012 1010     Lab Results  Component Value Date   HGBA1C 6.5 12/18/2014     Wt Readings from Last 3 Encounters:  04/24/15 97 lb (43.999 kg)  02/20/15 110 lb (49.896 kg)  02/02/15 110 lb (49.896 kg)      Other studies Reviewed: Additional studies/ records that were reviewed today  include: None.    ASSESSMENT AND PLAN:  Essential hypertension Patient's blood pressure is elevated today. However, she's on in medications. At this point I agree with not titrating her medications further given her advanced age. No change in therapy is planned.   Chronic kidney disease Most recent creatinine 2.88.   This can be followed by her primary provider.   Occlusion and stenosis of carotid artery without mention of cerebral infarction She is followed by [facility].   CAD (coronary artery disease) The patient has no new sypmtoms.  No further cardiovascular testing  is indicated.  We will continue with aggressive risk reduction and meds as listed.  Congestive heart failure She seems to be euvolemic.  At this point, no change in therapy is indicated.  We have reviewed salt and fluid restrictions.  No further cardiovascular testing is indicated.   Current medicines are reviewed at length with the patient today.  The patient does not have concerns regarding medicines.  The following changes have been made:  no change  Labs/ tests ordered today include: None     Disposition:   FU with Norma Fredrickson in six months.     Signed, Rollene Rotunda, MD  04/24/2015 1:50 PM    [facility]

## 2015-04-24 NOTE — Patient Instructions (Signed)
Your physician recommends that you schedule a follow-up appointment in: 6 Months with Norma Fredrickson

## 2015-04-27 ENCOUNTER — Encounter: Payer: Self-pay | Admitting: Vascular Surgery

## 2015-04-28 ENCOUNTER — Ambulatory Visit: Payer: Medicare Other | Admitting: Vascular Surgery

## 2015-05-08 ENCOUNTER — Encounter: Payer: Self-pay | Admitting: Vascular Surgery

## 2015-05-12 ENCOUNTER — Ambulatory Visit (INDEPENDENT_AMBULATORY_CARE_PROVIDER_SITE_OTHER): Payer: Medicare Other | Admitting: Vascular Surgery

## 2015-05-12 ENCOUNTER — Encounter: Payer: Self-pay | Admitting: Vascular Surgery

## 2015-05-12 VITALS — BP 208/64 | HR 68 | Resp 16 | Ht 63.0 in | Wt 110.0 lb

## 2015-05-12 DIAGNOSIS — I739 Peripheral vascular disease, unspecified: Secondary | ICD-10-CM | POA: Diagnosis not present

## 2015-05-12 DIAGNOSIS — I6523 Occlusion and stenosis of bilateral carotid arteries: Secondary | ICD-10-CM | POA: Diagnosis not present

## 2015-05-12 NOTE — Progress Notes (Signed)
Subjective:     Patient ID: Adrienne Bowman, female   DOB: [birth year], 81 y.o.   MRN: [id number]  HPI this 81 year old female returns for continued follow-up regarding her right femoral to posterior tibial bypass graft. She has known total occlusion of her outflow vessels but the graft has remained open. The bypass was placed in October 2002. She has a left above-knee mutation. She denies any symptoms in the right foot today. She does ambulate with a walker.  Past Medical History  Diagnosis Date  . CHF (congestive heart failure) 2002    acute CHF while getting lower extremilty arteriograms /  Led to CABG  x6  . Hypertension     very difficult to control  . PVD (peripheral vascular disease)     extensive PVD / left AKA  . Murmur, heart     soft systolic outflow murmur and a grade 2/6 murmur of the aortic insufficiency  . S/P AKA (above knee amputation)     left AKA with prosthesis  . DIABETES MELLITUS, TYPE II   . NEPHROPATHY, DIABETIC   . Coronary artery disease 2002    CABG x6 / left internal mammary artery graft to the LAD, a saphenous vein graft to the diagonal, a sequential saphenous vein graft to the 1st and 2nd obtuse marginal branches, and a sequential saphenous vein graft the the distal right coronary artery and posterior descending  . Anemia   . CKD (chronic kidney disease)   . Advanced age     History  Substance Use Topics  . Smoking status: Never Smoker   . Smokeless tobacco: Never Used  . Alcohol Use: No    Family History  Problem Relation Age of Onset  . Coronary artery disease      prevalent in sibling  . Heart disease Mother   . Hypertension Daughter   . Cancer Son     Lung  and  Throat  . Heart attack Son   . Hyperlipidemia Daughter   . Hypertension Daughter   . Hypertension Daughter   . Hypertension Daughter     No Known Allergies   Current outpatient prescriptions:  .  amlodipine-atorvastatin (CADUET) 10-10 MG per tablet, TAKE ONE TABLET BY MOUTH  EVERY DAY, Disp: 90 tablet, Rfl: 3 .  aspirin 81 MG tablet, Take 81 mg by mouth daily.  , Disp: , Rfl:  .  atenolol (TENORMIN) 50 MG tablet, TAKE ONE TABLET BY MOUTH ONCE DAILY, Disp: 90 tablet, Rfl: 0 .  cloNIDine (CATAPRES) 0.3 MG tablet, TAKE ONE TABLET BY MOUTH TWICE DAILY, Disp: 60 tablet, Rfl: 3 .  famotidine (PEPCID) 20 MG tablet, Take 1 tablet (20 mg total) by mouth 2 (two) times daily., Disp: 60 tablet, Rfl: 6 .  furosemide (LASIX) 40 MG tablet, Take 1 tablet (40 mg total) by mouth daily., Disp: 30 tablet, Rfl: 5 .  glyBURIDE (DIABETA) 1.25 MG tablet, Take 1 tablet (1.25 mg total) by mouth daily with breakfast. Needs an appointment with Dr and labs, for any additional refills., Disp: 30 tablet, Rfl: 6 .  hydrALAZINE (APRESOLINE) 100 MG tablet, Take 1 tablet (100 mg total) by mouth 3 (three) times daily., Disp: 90 tablet, Rfl: 3 .  multivitamin (THERAGRAN) per tablet, Take 1 tablet by mouth daily.  , Disp: , Rfl:   Filed Vitals:   05/12/15 1544 05/12/15 1545  BP: 210/62 208/64  Pulse: 72 68  Resp: 16   Height:  (1.6 m)   Weight: 110 lb (  49.896 kg)     Body mass index is 19.49 kg/(m^2).         Review of Systems denies chest pain, lateralizing weakness, aphasia, amaurosis fugax, diplopia, blurred vision, syncope.     Objective:   Physical Exam BP 208/64 mmHg  Pulse 68  Resp 16  Ht 5\' 3"  (1.6 m)  Wt 110 lb (49.896 kg)  BMI 19.49 kg/m2  General elderly female-spry-in no apparent distress Lungs no rhonchi or wheezing Cardiovascular regular rhythm no murmurs Right leg with 3+ femoral and 3+ graft pulse subcutaneously. Right foot free of any ischemic ulcers or gangrene. Left leg with AKA     Assessment:     Continued functioning of right femoral to posterior tibial bypass with total occlusion of outflow vessel No potential for thrombectomy or revascularization if this graft fails    Plan:     Return in 6 months for ABIs and duplex scan of bypass and see  nurse practitioner Continue daily aspirin

## 2015-05-12 NOTE — Progress Notes (Signed)
Filed Vitals:   05/12/15 1544 05/12/15 1545  BP: 210/62 208/64  Pulse: 72 68  Resp: 16   Height: 5\' 3"  (1.6 m)   Weight: 110 lb (49.896 kg)

## 2015-06-09 ENCOUNTER — Other Ambulatory Visit: Payer: Self-pay

## 2015-06-09 MED ORDER — ATENOLOL 50 MG PO TABS: ORAL_TABLET | ORAL | Status: DC

## 2015-07-12 ENCOUNTER — Encounter (HOSPITAL_COMMUNITY): Payer: Self-pay | Admitting: Nurse Practitioner

## 2015-07-12 ENCOUNTER — Observation Stay (HOSPITAL_COMMUNITY)
Admission: EM | Admit: 2015-07-12 | Discharge: 2015-07-13 | Disposition: A | Discharge status: Home / Self Care | Payer: Medicare Other | Attending: Internal Medicine | Admitting: Internal Medicine

## 2015-07-12 ENCOUNTER — Emergency Department (HOSPITAL_COMMUNITY): Payer: Medicare Other

## 2015-07-12 DIAGNOSIS — E119 Type 2 diabetes mellitus without complications: Secondary | ICD-10-CM | POA: Insufficient documentation

## 2015-07-12 DIAGNOSIS — I739 Peripheral vascular disease, unspecified: Secondary | ICD-10-CM | POA: Diagnosis not present

## 2015-07-12 DIAGNOSIS — I129 Hypertensive chronic kidney disease with stage 1 through stage 4 chronic kidney disease, or unspecified chronic kidney disease: Secondary | ICD-10-CM | POA: Insufficient documentation

## 2015-07-12 DIAGNOSIS — Z951 Presence of aortocoronary bypass graft: Secondary | ICD-10-CM | POA: Diagnosis not present

## 2015-07-12 DIAGNOSIS — I214 Non-ST elevation (NSTEMI) myocardial infarction: Secondary | ICD-10-CM | POA: Insufficient documentation

## 2015-07-12 DIAGNOSIS — N189 Chronic kidney disease, unspecified: Secondary | ICD-10-CM | POA: Insufficient documentation

## 2015-07-12 DIAGNOSIS — E1121 Type 2 diabetes mellitus with diabetic nephropathy: Secondary | ICD-10-CM | POA: Diagnosis not present

## 2015-07-12 DIAGNOSIS — Z89612 Acquired absence of left leg above knee: Secondary | ICD-10-CM

## 2015-07-12 DIAGNOSIS — Z79899 Other long term (current) drug therapy: Secondary | ICD-10-CM | POA: Diagnosis not present

## 2015-07-12 DIAGNOSIS — R0602 Shortness of breath: Secondary | ICD-10-CM | POA: Diagnosis not present

## 2015-07-12 DIAGNOSIS — I2510 Atherosclerotic heart disease of native coronary artery without angina pectoris: Secondary | ICD-10-CM | POA: Diagnosis not present

## 2015-07-12 DIAGNOSIS — R011 Cardiac murmur, unspecified: Secondary | ICD-10-CM | POA: Diagnosis not present

## 2015-07-12 DIAGNOSIS — I509 Heart failure, unspecified: Secondary | ICD-10-CM | POA: Insufficient documentation

## 2015-07-12 DIAGNOSIS — I10 Essential (primary) hypertension: Secondary | ICD-10-CM

## 2015-07-12 DIAGNOSIS — R079 Chest pain, unspecified: Principal | ICD-10-CM | POA: Diagnosis present

## 2015-07-12 DIAGNOSIS — R000 Tachycardia, unspecified: Secondary | ICD-10-CM | POA: Diagnosis not present

## 2015-07-12 DIAGNOSIS — D649 Anemia, unspecified: Secondary | ICD-10-CM | POA: Insufficient documentation

## 2015-07-12 DIAGNOSIS — Z89619 Acquired absence of unspecified leg above knee: Secondary | ICD-10-CM

## 2015-07-12 DIAGNOSIS — E785 Hyperlipidemia, unspecified: Secondary | ICD-10-CM

## 2015-07-12 LAB — CBC
HCT: 31.1 % — ABNORMAL LOW (ref 36.0–46.0)
HCT: 28 % — ABNORMAL LOW (ref 36.0–46.0)
Hemoglobin: 10.1 g/dL — ABNORMAL LOW (ref 12.0–15.0)
Hemoglobin: 9.3 g/dL — ABNORMAL LOW (ref 12.0–15.0)
MCH: 27.7 pg (ref 26.0–34.0)
MCH: 28.1 pg (ref 26.0–34.0)
MCHC: 32.5 g/dL (ref 30.0–36.0)
MCHC: 33.2 g/dL (ref 30.0–36.0)
MCV: 85.2 fL (ref 78.0–100.0)
MCV: 84.6 fL (ref 78.0–100.0)
PLATELETS: 211 10*3/uL (ref 150–400)
PLATELETS: 177 10*3/uL (ref 150–400)
RBC: 3.65 MIL/uL — AB (ref 3.87–5.11)
RBC: 3.31 MIL/uL — ABNORMAL LOW (ref 3.87–5.11)
RDW: 15.1 % (ref 11.5–15.5)
RDW: 15.1 % (ref 11.5–15.5)
WBC: 7 10*3/uL (ref 4.0–10.5)
WBC: 6 10*3/uL (ref 4.0–10.5)

## 2015-07-12 LAB — BASIC METABOLIC PANEL
Anion gap: 11 (ref 5–15)
BUN: 74 mg/dL — ABNORMAL HIGH (ref 6–20)
CALCIUM: 9.1 mg/dL (ref 8.9–10.3)
CO2: 23 mmol/L (ref 22–32)
CREATININE: 3.4 mg/dL — AB (ref 0.44–1.00)
Chloride: 105 mmol/L (ref 101–111)
GFR calc non Af Amer: 11 mL/min — ABNORMAL LOW (ref >60)
GFR, EST AFRICAN AMERICAN: 12 mL/min — AB (ref >60)
GLUCOSE: 295 mg/dL — AB (ref 65–99)
Potassium: 3.4 mmol/L — ABNORMAL LOW (ref 3.5–5.1)
Sodium: 139 mmol/L (ref 135–145)

## 2015-07-12 LAB — CREATININE, SERUM
Creatinine, Ser: 3.27 mg/dL — ABNORMAL HIGH (ref 0.44–1.00)
GFR calc Af Amer: 13 mL/min — ABNORMAL LOW (ref >60)
GFR calc non Af Amer: 11 mL/min — ABNORMAL LOW (ref >60)

## 2015-07-12 LAB — TROPONIN I
TROPONIN I: 0.07 ng/mL — AB (ref <0.031)
Troponin I: 0.32 ng/mL — ABNORMAL HIGH (ref <0.031)
Troponin I: 0.82 ng/mL (ref <0.031)

## 2015-07-12 LAB — I-STAT TROPONIN, ED: Troponin i, poc: 0.05 ng/mL (ref 0.00–0.08)

## 2015-07-12 LAB — GLUCOSE, CAPILLARY
GLUCOSE-CAPILLARY: 123 mg/dL — AB (ref 65–99)
Glucose-Capillary: 135 mg/dL — ABNORMAL HIGH (ref 65–99)

## 2015-07-12 LAB — TSH: TSH: 1.599 u[IU]/mL (ref 0.350–4.500)

## 2015-07-12 MED ORDER — CLONIDINE HCL 0.2 MG PO TABS
0.2000 mg | ORAL_TABLET | Freq: Two times a day (BID) | ORAL | Status: DC
  Administered 2015-07-12 – 2015-07-13 (×2): 0.2 mg via ORAL
  Filled 2015-07-12 (×2): qty 1

## 2015-07-12 MED ORDER — CARVEDILOL 25 MG PO TABS
25.0000 mg | ORAL_TABLET | Freq: Two times a day (BID) | ORAL | Status: DC
Start: 2015-07-12 — End: 2015-07-13
  Administered 2015-07-12 – 2015-07-13 (×3): 25 mg via ORAL
  Filled 2015-07-12 (×3): qty 1

## 2015-07-12 MED ORDER — ADULT MULTIVITAMIN W/MINERALS CH
1.0000 | ORAL_TABLET | Freq: Every day | ORAL | Status: DC
Start: 2015-07-12 — End: 2015-07-13
  Administered 2015-07-13: 1 via ORAL
  Filled 2015-07-12 (×3): qty 1

## 2015-07-12 MED ORDER — FUROSEMIDE 40 MG PO TABS
40.0000 mg | ORAL_TABLET | Freq: Every day | ORAL | Status: DC
  Administered 2015-07-13: 40 mg via ORAL
  Filled 2015-07-12 (×2): qty 1

## 2015-07-12 MED ORDER — MORPHINE SULFATE (PF) 2 MG/ML IV SOLN: 1.0000 mg | INTRAVENOUS | Status: DC | PRN

## 2015-07-12 MED ORDER — MORPHINE SULFATE (PF) 2 MG/ML IV SOLN: 2.0000 mg | INTRAVENOUS | Status: DC | PRN

## 2015-07-12 MED ORDER — ASPIRIN 81 MG PO CHEW
324.0000 mg | CHEWABLE_TABLET | Freq: Once | ORAL | Status: AC
  Administered 2015-07-12: 324 mg via ORAL
  Filled 2015-07-12: qty 4

## 2015-07-12 MED ORDER — HYDRALAZINE HCL 50 MG PO TABS
100.0000 mg | ORAL_TABLET | Freq: Three times a day (TID) | ORAL | Status: DC
  Administered 2015-07-12 – 2015-07-13 (×3): 100 mg via ORAL
  Filled 2015-07-12 (×7): qty 2

## 2015-07-12 MED ORDER — HEPARIN SODIUM (PORCINE) 5000 UNIT/ML IJ SOLN
5000.0000 [IU] | Freq: Three times a day (TID) | INTRAMUSCULAR | Status: DC
  Administered 2015-07-12 – 2015-07-13 (×3): 5000 [IU] via SUBCUTANEOUS
  Filled 2015-07-12 (×3): qty 1

## 2015-07-12 MED ORDER — ATORVASTATIN CALCIUM 10 MG PO TABS
10.0000 mg | ORAL_TABLET | Freq: Every day | ORAL | Status: DC
  Administered 2015-07-13: 10 mg via ORAL
  Filled 2015-07-12 (×2): qty 1

## 2015-07-12 MED ORDER — ONDANSETRON HCL 4 MG/2ML IJ SOLN: 4.0000 mg | Freq: Four times a day (QID) | INTRAMUSCULAR | Status: DC | PRN

## 2015-07-12 MED ORDER — GLYBURIDE 1.25 MG PO TABS
1.2500 mg | ORAL_TABLET | Freq: Every day | ORAL | Status: DC
  Administered 2015-07-13: 1.25 mg via ORAL
  Filled 2015-07-12 (×2): qty 1

## 2015-07-12 MED ORDER — INSULIN ASPART 100 UNIT/ML ~~LOC~~ SOLN
0.0000 [IU] | Freq: Three times a day (TID) | SUBCUTANEOUS | Status: DC
  Administered 2015-07-12: 1 [IU] via SUBCUTANEOUS
  Administered 2015-07-13: 2 [IU] via SUBCUTANEOUS

## 2015-07-12 MED ORDER — GI COCKTAIL ~~LOC~~: 30.0000 mL | Freq: Four times a day (QID) | ORAL | Status: DC | PRN

## 2015-07-12 MED ORDER — HYDRALAZINE HCL 50 MG PO TABS
100.0000 mg | ORAL_TABLET | Freq: Once | ORAL | Status: AC
  Administered 2015-07-12: 100 mg via ORAL
  Filled 2015-07-12: qty 2

## 2015-07-12 MED ORDER — CLONIDINE HCL 0.2 MG PO TABS
0.3000 mg | ORAL_TABLET | Freq: Once | ORAL | Status: AC
  Administered 2015-07-12: 0.3 mg via ORAL
  Filled 2015-07-12 (×2): qty 1

## 2015-07-12 MED ORDER — NITROGLYCERIN 0.4 MG SL SUBL
0.4000 mg | SUBLINGUAL_TABLET | SUBLINGUAL | Status: DC | PRN
  Filled 2015-07-12: qty 1

## 2015-07-12 MED ORDER — ASPIRIN EC 81 MG PO TBEC
81.0000 mg | DELAYED_RELEASE_TABLET | Freq: Every day | ORAL | Status: DC
  Administered 2015-07-13: 81 mg via ORAL
  Filled 2015-07-12 (×2): qty 1

## 2015-07-12 MED ORDER — PANTOPRAZOLE SODIUM 40 MG PO TBEC
40.0000 mg | DELAYED_RELEASE_TABLET | Freq: Every day | ORAL | Status: DC
  Administered 2015-07-13: 40 mg via ORAL
  Filled 2015-07-12: qty 1

## 2015-07-12 MED ORDER — AMLODIPINE-ATORVASTATIN 10-10 MG PO TABS: 1.0000 | ORAL_TABLET | Freq: Every day | ORAL | Status: DC

## 2015-07-12 MED ORDER — NITROGLYCERIN 0.4 MG SL SUBL
0.4000 mg | SUBLINGUAL_TABLET | SUBLINGUAL | Status: DC | PRN
  Administered 2015-07-12 (×2): 0.4 mg via SUBLINGUAL
  Filled 2015-07-12: qty 1

## 2015-07-12 MED ORDER — AMLODIPINE BESYLATE 10 MG PO TABS
10.0000 mg | ORAL_TABLET | Freq: Every day | ORAL | Status: DC
  Administered 2015-07-13: 10 mg via ORAL
  Filled 2015-07-12 (×2): qty 1

## 2015-07-12 MED ORDER — NITROGLYCERIN 0.4 MG SL SUBL: 0.4000 mg | SUBLINGUAL_TABLET | SUBLINGUAL | Status: DC | PRN

## 2015-07-12 MED ORDER — ACETAMINOPHEN 325 MG PO TABS: 650.0000 mg | ORAL_TABLET | ORAL | Status: DC | PRN

## 2015-07-12 NOTE — ED Notes (Signed)
Patient transported to X-ray 

## 2015-07-12 NOTE — ED Provider Notes (Signed)
CSN: [id number]     Arrival date & time 07/12/15  1109 History   First MD Initiated Contact with Patient 07/12/15 1124     Chief Complaint  Patient presents with  . Chest Pain     (Consider location/radiation/quality/duration/timing/severity/associated sxs/prior Treatment) HPI   81 year old female with multiple comorbidities to include coronary artery bypass Graft 6, chronic kidney disease, hypertension, hyperlipidemia who presents to the emergency department today secondary to acute onset left-sided chest pressure radiating to her left arm associated with some dyspnea. No associated nausea, vomiting, diaphoresis, fever or cough. She has not had these symptoms before. She initially started off as only lasted a few minutes however when she walked around and came back. This worried her so she came here for further evaluation. She states it was not severe in nature but was more moderate. It was made better when she relaxed and has minimal pain at this time.  Past Medical History  Diagnosis Date  . CHF (congestive heart failure) 2002    acute CHF while getting lower extremilty arteriograms /  Led to CABG  x6  . Hypertension     very difficult to control  . PVD (peripheral vascular disease)     extensive PVD / left AKA  . Murmur, heart     soft systolic outflow murmur and a grade 2/6 murmur of the aortic insufficiency  . S/P AKA (above knee amputation)     left AKA with prosthesis  . DIABETES MELLITUS, TYPE II   . NEPHROPATHY, DIABETIC   . Coronary artery disease 2002    CABG x6 / left internal mammary artery graft to the LAD, a saphenous vein graft to the diagonal, a sequential saphenous vein graft to the 1st and 2nd obtuse marginal branches, and a sequential saphenous vein graft the the distal right coronary artery and posterior descending  . Anemia   . CKD (chronic kidney disease)   . Advanced age    Past Surgical History  Procedure Laterality Date  . Coronary artery bypass graft   2002    x6 per Dr. Laneta Simmers  . Eye surgery  1987 & 1988     left eye  . Vascular surgery      multiple peripheral vascular surgeries  . Left aka  2006  . Right renal artery stent  2004  . Rfpbpg  2002  . Lower extremity angiogram  February 21, 2012  . Pr vein bypass graft,aorto-fem-pop  Oct. 17, 2012    Right  Fem-Distal post. Tibial artery BPG.  . Lower extremity angiogram N/A 02/21/2012    Procedure: LOWER EXTREMITY ANGIOGRAM;  Surgeon: Nada Libman, MD;  Location: [facility] CATH LAB;  Service: Cardiovascular;  Laterality: N/A;  . Lower extremity angiogram Right 12/25/2012    Procedure: LOWER EXTREMITY ANGIOGRAM;  Surgeon: Nada Libman, MD;  Location: [facility] CATH LAB;  Service: Cardiovascular;  Laterality: Right;  rt leg angio  . Abdominal angiogram  12/25/2012    Procedure: ABDOMINAL ANGIOGRAM;  Surgeon: Nada Libman, MD;  Location: [facility] CATH LAB;  Service: Cardiovascular;;   Family History  Problem Relation Age of Onset  . Coronary artery disease      prevalent in sibling  . Heart disease Mother   . Hypertension Daughter   . Cancer Son     Lung  and  Throat  . Heart attack Son   . Hyperlipidemia Daughter   . Hypertension Daughter   . Hypertension Daughter   . Hypertension Daughter  Social History  Substance Use Topics  . Smoking status: Never Smoker   . Smokeless tobacco: Never Used  . Alcohol Use: No   OB History    No data available     Review of Systems  Constitutional: Negative for fever.  HENT: Negative for congestion and drooling.   Respiratory: Positive for shortness of breath. Negative for cough.   Cardiovascular: Positive for chest pain.  All other systems reviewed and are negative.     Allergies  Review of patient's allergies indicates no known allergies.  Home Medications   Prior to Admission medications   Medication Sig Start Date End Date Taking? Authorizing Provider  amlodipine-atorvastatin (CADUET) 10-10 MG per tablet TAKE ONE TABLET BY MOUTH EVERY  DAY 09/03/14  Yes Tonny Bollman, MD  aspirin 81 MG tablet Take 81 mg by mouth daily.     Yes Historical Provider, MD  atenolol (TENORMIN) 50 MG tablet TAKE ONE TABLET BY MOUTH ONCE DAILY 06/09/15  Yes Rollene Rotunda, MD  cloNIDine (CATAPRES) 0.3 MG tablet TAKE ONE TABLET BY MOUTH TWICE DAILY 03/30/15  Yes Dyann Kief, PA-C  famotidine (PEPCID) 20 MG tablet Take 1 tablet (20 mg total) by mouth 2 (two) times daily. 12/11/14  Yes Rollene Rotunda, MD  furosemide (LASIX) 40 MG tablet Take 1 tablet (40 mg total) by mouth daily. 12/10/14  Yes Dianne Dun, MD  glyBURIDE (DIABETA) 1.25 MG tablet Take 1 tablet (1.25 mg total) by mouth daily with breakfast. Needs an appointment with Dr and labs, for any additional refills. 12/18/14  Yes Dianne Dun, MD  hydrALAZINE (APRESOLINE) 100 MG tablet Take 1 tablet (100 mg total) by mouth 3 (three) times daily. 03/05/15  Yes Rollene Rotunda, MD  multivitamin [facility]) per tablet Take 1 tablet by mouth daily.     Yes Historical Provider, MD   BP 191/51 mmHg  Pulse 77  Temp(Src) 98.6 F (37 C) (Oral)  Resp 18  Ht 5\' 6"  (1.676 m)  Wt 101 lb 3.1 oz (45.9 kg)  BMI 16.34 kg/m2  SpO2 100% Physical Exam  Constitutional: She is oriented to person, place, and time. She appears well-developed and well-nourished.  HENT:  Head: Normocephalic and atraumatic.  Eyes: Conjunctivae and EOM are normal. Right eye exhibits no discharge. Left eye exhibits no discharge.  Cardiovascular: Regular rhythm.  Tachycardia present.   Murmur heard. Pulmonary/Chest: Effort normal and breath sounds normal. No respiratory distress.  Abdominal: Soft. She exhibits no distension. There is no tenderness. There is no rebound.  Musculoskeletal: Normal range of motion. She exhibits no edema or tenderness.  Neurological: She is alert and oriented to person, place, and time.  Skin: Skin is warm and dry.  Nursing note and vitals reviewed.   ED Course  Procedures (including critical care  time) Labs Review Labs Reviewed  BASIC METABOLIC PANEL - Abnormal; Notable for the following:    Potassium 3.4 (*)    Glucose, Bld 295 (*)    BUN 74 (*)    Creatinine, Ser 3.40 (*)    GFR calc non Af Amer 11 (*)    GFR calc Af Amer 12 (*)    All other components within normal limits  CBC - Abnormal; Notable for the following:    RBC 3.65 (*)    Hemoglobin 10.1 (*)    HCT 31.1 (*)    All other components within normal limits  TROPONIN I - Abnormal; Notable for the following:    Troponin I 0.07 (*)  All other components within normal limits  CBC - Abnormal; Notable for the following:    RBC 3.31 (*)    Hemoglobin 9.3 (*)    HCT 28.0 (*)    All other components within normal limits  CREATININE, SERUM - Abnormal; Notable for the following:    Creatinine, Ser 3.27 (*)    GFR calc non Af Amer 11 (*)    GFR calc Af Amer 13 (*)    All other components within normal limits  TROPONIN I - Abnormal; Notable for the following:    Troponin I 0.32 (*)    All other components within normal limits  TROPONIN I - Abnormal; Notable for the following:    Troponin I 0.82 (*)    All other components within normal limits  TROPONIN I - Abnormal; Notable for the following:    Troponin I 0.78 (*)    All other components within normal limits  GLUCOSE, CAPILLARY - Abnormal; Notable for the following:    Glucose-Capillary 135 (*)    All other components within normal limits  CBC - Abnormal; Notable for the following:    RBC 3.20 (*)    Hemoglobin 8.9 (*)    HCT 27.2 (*)    All other components within normal limits  BASIC METABOLIC PANEL - Abnormal; Notable for the following:    Glucose, Bld 132 (*)    BUN 66 (*)    Creatinine, Ser 3.18 (*)    Calcium 8.6 (*)    GFR calc non Af Amer 12 (*)    GFR calc Af Amer 13 (*)    All other components within normal limits  LIPID PANEL - Abnormal; Notable for the following:    HDL 34 (*)    All other components within normal limits  GLUCOSE, CAPILLARY  - Abnormal; Notable for the following:    Glucose-Capillary 123 (*)    All other components within normal limits  TSH  HEMOGLOBIN A1C  I-STAT TROPOININ, ED    Imaging Review Dg Chest 2 View  07/12/2015   CLINICAL DATA:  Left chest pain since last night.  EXAM: CHEST  2 VIEW  COMPARISON:  January 27, 2004  FINDINGS: The heart size and mediastinal contours are stable. Patient status post prior median sternotomy and CABG. There is mild interstitial pulmonary edema. There is mild atelectasis of bilateral lung bases. There is no focal pneumonia or pleural effusion. The visualized skeletal structures are unremarkable.  IMPRESSION: Mild bilateral interstitial edema.   Electronically Signed   By: Sherian Rein M.D.   On: 07/12/2015 12:43   I have personally reviewed and evaluated these images and lab results as part of my medical decision-making.   EKG Interpretation   Date/Time:  Sunday July 12 2015 12:48:26 EDT Ventricular Rate:  93 PR Interval:  156 QRS Duration: 97 QT Interval:  362 QTC Calculation: 450 R Axis:   -27 Text Interpretation:  Sinus rhythm LVH with secondary repolarization  abnormality Probable anterior infarct, age indeterminate Confirmed by  [facility] MD, Barbara Cower [phone]) on 07/12/2015 2:00:41 PM      MDM   Final diagnoses:  None   81 year old female with past medical history of heart disease presents to the emergency department today for a concerning sounding chest pain. Initial troponin positive EKG with LVH which is not new but does have worsening repolarization out of maladies versus ischemia. Attempted to discuss the case with cardiology, Dr. Mayford Knife, who did not want to hear the story instead said  this was likely secondary to her renal disease and just admit her to medicine to trend troponins and hung up without hearing further history. With this I discussed the case with medicine who was willing to admit the patient and continue the workup as an inpatient. Repeat EKGs in  the emergency department were without changes. Initially hypertensive at improved a little bit without intervention but then became hypertensive again I gave her her home medications to attempt to alleviate that. This may be contributing to her chest pain as well.     Marily Memos, MD 07/13/15 [phone]

## 2015-07-12 NOTE — H&P (Signed)
[facility] History and Physical  KEUNDRA PETRUCELLI Bowman:[id number] DOB: 1918/10/23 DOA: 07/12/2015  Referring physician: Dr. Erin Hearing PCP: Adrienne Mannan, MD   Chief Complaint: Chest pain  HPI: Adrienne Bowman is a 81 y.o. female with past medical history significant for hypertension, hyperlipidemia, diabetes mellitus type 2 with nephropathy, chronic kidney disease stage 3-4, peripheral vascular disease is status post left AKA and GERD; who presented to the emergency department secondary to chest pain. Patient reported having some chest pain that started just today night in the middle of her chest and radiated to the left side and also to her left arm; the patient lasted approximately 20-30 minutes 1 present and was associated with mild shortness of breath and nausea. Patient denies diaphoresis, vomiting, palpitations, abdominal pain, dysuria, melena, hematochezia, hematemesis, headaches, blurred vision or any other complaints. In the ED, she was found to have some ST depression on the anterior/lateral leads of her EKG and a troponin of 0.04 in the settings of chronic kidney disease. Her chest x-ray demonstrated some vascular congestion but no acute infiltrates. [facility] has been called to admit the patient for further evaluation and treatment of her chest pain.   Review of Systems:  Negative except as otherwise mentioned in history of present illness.  Past Medical History  Diagnosis Date  . CHF (congestive heart failure) 2002    acute CHF while getting lower extremilty arteriograms /  Led to CABG  x6  . Hypertension     very difficult to control  . PVD (peripheral vascular disease)     extensive PVD / left AKA  . Murmur, heart     soft systolic outflow murmur and a grade 2/6 murmur of the aortic insufficiency  . S/P AKA (above knee amputation)     left AKA with prosthesis  . DIABETES MELLITUS, TYPE II   . NEPHROPATHY, DIABETIC   . Coronary artery disease 2002    CABG x6 / left  internal mammary artery graft to the LAD, a saphenous vein graft to the diagonal, a sequential saphenous vein graft to the 1st and 2nd obtuse marginal branches, and a sequential saphenous vein graft the the distal right coronary artery and posterior descending  . Anemia   . CKD (chronic kidney disease)   . Advanced age    Past Surgical History  Procedure Laterality Date  . Coronary artery bypass graft  2002    x6 per Dr. Laneta Simmers  . Eye surgery  1987 & 1988     left eye  . Vascular surgery      multiple peripheral vascular surgeries  . Left aka  2006  . Right renal artery stent  2004  . Rfpbpg  2002  . Lower extremity angiogram  February 21, 2012  . Pr vein bypass graft,aorto-fem-pop  Oct. 17, 2012    Right  Fem-Distal post. Tibial artery BPG.  . Lower extremity angiogram N/A 02/21/2012    Procedure: LOWER EXTREMITY ANGIOGRAM;  Surgeon: Nada Libman, MD;  Location: [facility] CATH LAB;  Service: Cardiovascular;  Laterality: N/A;  . Lower extremity angiogram Right 12/25/2012    Procedure: LOWER EXTREMITY ANGIOGRAM;  Surgeon: Nada Libman, MD;  Location: [facility] CATH LAB;  Service: Cardiovascular;  Laterality: Right;  rt leg angio  . Abdominal angiogram  12/25/2012    Procedure: ABDOMINAL ANGIOGRAM;  Surgeon: Nada Libman, MD;  Location: [facility] CATH LAB;  Service: Cardiovascular;;   Social History:  reports that she has never smoked. She has never  used smokeless tobacco. She reports that she does not drink alcohol or use illicit drugs.  No Known Allergies  Family History  Problem Relation Age of Onset  . Coronary artery disease      prevalent in sibling  . Heart disease Mother   . Hypertension Daughter   . Cancer Son     Lung  and  Throat  . Heart attack Son   . Hyperlipidemia Daughter   . Hypertension Daughter   . Hypertension Daughter   . Hypertension Daughter     Prior to Admission medications   Medication Sig Start Date End Date Taking? Authorizing Provider  amlodipine-atorvastatin  (CADUET) 10-10 MG per tablet TAKE ONE TABLET BY MOUTH EVERY DAY 09/03/14  Yes Tonny Bollman, MD  aspirin 81 MG tablet Take 81 mg by mouth daily.     Yes Historical Provider, MD  atenolol (TENORMIN) 50 MG tablet TAKE ONE TABLET BY MOUTH ONCE DAILY 06/09/15  Yes Rollene Rotunda, MD  cloNIDine (CATAPRES) 0.3 MG tablet TAKE ONE TABLET BY MOUTH TWICE DAILY 03/30/15  Yes Dyann Kief, PA-C  famotidine (PEPCID) 20 MG tablet Take 1 tablet (20 mg total) by mouth 2 (two) times daily. 12/11/14  Yes Rollene Rotunda, MD  furosemide (LASIX) 40 MG tablet Take 1 tablet (40 mg total) by mouth daily. 12/10/14  Yes Dianne Dun, MD  glyBURIDE (DIABETA) 1.25 MG tablet Take 1 tablet (1.25 mg total) by mouth daily with breakfast. Needs an appointment with Dr and labs, for any additional refills. 12/18/14  Yes Dianne Dun, MD  hydrALAZINE (APRESOLINE) 100 MG tablet Take 1 tablet (100 mg total) by mouth 3 (three) times daily. 03/05/15  Yes Rollene Rotunda, MD  multivitamin [facility]) per tablet Take 1 tablet by mouth daily.     Yes Historical Provider, MD   Physical Exam: Filed Vitals:   07/12/15 1200 07/12/15 1300 07/12/15 1331 07/12/15 1400  BP: 166/63 205/97 207/63 211/66  Pulse: 93 93 92 87  Temp:      TempSrc:      Resp: SpO2: 100% 100% 100% 100%    Wt Readings from Last 3 Encounters:  05/12/15 49.896 kg (110 lb)  04/24/15 43.999 kg (97 lb)  02/20/15 49.896 kg (110 lb)    General:  Appears calm; no fever, oriented 3 and able to follow commands appropriately. Patient is still complaining of some soreness on the left side of her chest but improved after receiving nitroglycerin in the ED. Eyes: PERRL, normal lids, irises & conjunctiva, no icterus ENT: grossly normal hearing, lips & tongue, no thrush, no erythema or exudates. There is no drainage out of her years or nostrils Neck: no LAD, masses or thyromegaly, no JVD Cardiovascular: Positive systolic ejection murmur, no rubs or gallops, regular  rate, S1 and S2 appreciated Respiratory: Good air movement bilaterally, no crackles, no wheezing. Normal respiratory effort. Abdomen: soft, nt, nondistended, positive bowel sounds Skin: no rash or induration seen on limited exam Musculoskeletal: grossly normal tone BUE, no edema of her right lower extremity, left AKA Psychiatric: grossly normal mood and affect, speech fluent and appropriate Neurologic: grossly non-focal.          Labs on Admission:  Basic Metabolic Panel:  Recent Labs Lab 07/12/15 1140  NA 139  K 3.4*  CL 105  CO2 23  GLUCOSE 295*  BUN 74*  CREATININE 3.40*  CALCIUM 9.1   CBC:  Recent Labs Lab 07/12/15 1140  WBC 7.0  HGB 10.1*  HCT 31.1*  MCV 85.2  PLT 211   Cardiac Enzymes:  Recent Labs Lab 07/12/15 1140  TROPONINI 0.07*    Radiological Exams on Admission: Dg Chest 2 View  07/12/2015   CLINICAL DATA:  Left chest pain since last night.  EXAM: CHEST  2 VIEW  COMPARISON:  January 27, 2004  FINDINGS: The heart size and mediastinal contours are stable. Patient status post prior median sternotomy and CABG. There is mild interstitial pulmonary edema. There is mild atelectasis of bilateral lung bases. There is no focal pneumonia or pleural effusion. The visualized skeletal structures are unremarkable.  IMPRESSION: Mild bilateral interstitial edema.   Electronically Signed   By: Sherian Rein M.D.   On: 07/12/2015 12:43    EKG:  Regular rate and rhythm, left ventricular hypertrophy, mild ST depression on her anterior leads and some early repolarization.  Assessment/Plan 1-Chest pain: With some typical presentation (lasting 20-30 minutes, improved with aspirin and nitroglycerin, localized in the left side of her chest with radiation to her left arm and some associated shortness of breath and nausea when the pain was present) and hard score of 6. -Patient will be admitted to telemetry -Will continue aspirin, will initiate cardiac 25 mg twice a day, continue  statins and use as needed oxygen -Will cycle troponin and perform 2-D echo -Cardiology was initially consulted by ED physician, but recommended [facility] to admit to cycle troponins,  EKG and check 2-D echo and if needed to call formal consult later on. Patient has chronic renal failure and not a good candidate for catheterization. -Will also check A1c, TSH and lipid profile -PRN morphine and NTG as needed for pain -Patient initial troponin 0.07 (increased the setting of chronic kidney disease); also with EKG demonstrating some ST depressions in her anterolateral leads; unsure if they are new; no prior recent comparison tracing to evaluate.  2-Essential hypertension: Elevated and uncontrolled on presentation. Patient reported that she has not taken her medications today -Will continue home medication regimen with the exception of her atenolol that would be switch to Coreg -And her Catapres B adjusted to 0.2 mg twice a day -Low sodium diet has been ordered and will follow her vital signs for further antihypertensive drugs adjustment as needed  3-PVD (peripheral vascular disease): Status post left AKA. -Appears stable -Will continue aspirin and statins -Continue blood pressure control.  4-Diabetes mellitus with nephropathy: Will check hemoglobin A1c -Will use a sliding scale insulin and continue glipizide  5-S/P CABG (coronary artery bypass graft): Treatment as mentioned above. -Continue beta blocker, aspirin, statins and will check 2-D echo -PRN nitroglycerin for chest pain -If needed will consult cardiology for medication adjustment and further recommendations. -Not a great candidate for further catheterization or invasive treatment at this point.  6-HLD (hyperlipidemia): Will continue statins and follow lipid profile  7-GERD: will use PPI and when necessary GI cocktail.  8-chronic heart failure: All clear type has not 2-D echo on our electronic records and family unable to  provide information. -Will continue Lasix -Daily weights and strict intake and output -Low sodium diet -Continue beta blocker therapy. -Will check 2-D echo during this admission -Overall stable  Code Status: DO NOT RESUSCITATE DVT Prophylaxis: Heparin Family Communication: Daughters at bedside Disposition Plan: LOS less than 2 midnights, telemetry, Observation status   Time spent: 50 minutes   [name] [facility] Pager [phone]

## 2015-07-12 NOTE — ED Notes (Signed)
Pt reports an episode of cp last night that resolved after a short time. After waking this morning and getting out of bed the pain returned in the middle and L side of her chest. Denies sob, nausea. Nothing improves or relieves the pain. Pain remains since onset

## 2015-07-12 NOTE — Progress Notes (Signed)
K.Schorr NP paged in reference to + Troponins 0.32 and c/o continuous chest pain 3/10. VS obtained EKG done

## 2015-07-13 ENCOUNTER — Observation Stay (HOSPITAL_BASED_OUTPATIENT_CLINIC_OR_DEPARTMENT_OTHER): Payer: Medicare Other

## 2015-07-13 DIAGNOSIS — Z89612 Acquired absence of left leg above knee: Secondary | ICD-10-CM | POA: Diagnosis not present

## 2015-07-13 DIAGNOSIS — R0789 Other chest pain: Secondary | ICD-10-CM | POA: Diagnosis not present

## 2015-07-13 DIAGNOSIS — E1121 Type 2 diabetes mellitus with diabetic nephropathy: Secondary | ICD-10-CM

## 2015-07-13 DIAGNOSIS — I255 Ischemic cardiomyopathy: Secondary | ICD-10-CM

## 2015-07-13 DIAGNOSIS — I10 Essential (primary) hypertension: Secondary | ICD-10-CM | POA: Diagnosis not present

## 2015-07-13 DIAGNOSIS — E1122 Type 2 diabetes mellitus with diabetic chronic kidney disease: Secondary | ICD-10-CM

## 2015-07-13 DIAGNOSIS — R000 Tachycardia, unspecified: Secondary | ICD-10-CM | POA: Diagnosis not present

## 2015-07-13 DIAGNOSIS — R011 Cardiac murmur, unspecified: Secondary | ICD-10-CM | POA: Diagnosis not present

## 2015-07-13 DIAGNOSIS — R079 Chest pain, unspecified: Secondary | ICD-10-CM

## 2015-07-13 DIAGNOSIS — I739 Peripheral vascular disease, unspecified: Secondary | ICD-10-CM | POA: Diagnosis not present

## 2015-07-13 DIAGNOSIS — I2510 Atherosclerotic heart disease of native coronary artery without angina pectoris: Secondary | ICD-10-CM | POA: Diagnosis not present

## 2015-07-13 DIAGNOSIS — I214 Non-ST elevation (NSTEMI) myocardial infarction: Secondary | ICD-10-CM | POA: Diagnosis not present

## 2015-07-13 DIAGNOSIS — N184 Chronic kidney disease, stage 4 (severe): Secondary | ICD-10-CM | POA: Diagnosis not present

## 2015-07-13 LAB — BASIC METABOLIC PANEL
ANION GAP: 9 (ref 5–15)
BUN: 66 mg/dL — ABNORMAL HIGH (ref 6–20)
CALCIUM: 8.6 mg/dL — AB (ref 8.9–10.3)
CO2: 26 mmol/L (ref 22–32)
Chloride: 104 mmol/L (ref 101–111)
Creatinine, Ser: 3.18 mg/dL — ABNORMAL HIGH (ref 0.44–1.00)
GFR, EST AFRICAN AMERICAN: 13 mL/min — AB (ref >60)
GFR, EST NON AFRICAN AMERICAN: 12 mL/min — AB (ref >60)
Glucose, Bld: 132 mg/dL — ABNORMAL HIGH (ref 65–99)
Potassium: 3.6 mmol/L (ref 3.5–5.1)
Sodium: 139 mmol/L (ref 135–145)

## 2015-07-13 LAB — CBC
HEMATOCRIT: 27.2 % — AB (ref 36.0–46.0)
Hemoglobin: 8.9 g/dL — ABNORMAL LOW (ref 12.0–15.0)
MCH: 27.8 pg (ref 26.0–34.0)
MCHC: 32.7 g/dL (ref 30.0–36.0)
MCV: 85 fL (ref 78.0–100.0)
PLATELETS: 173 10*3/uL (ref 150–400)
RBC: 3.2 MIL/uL — ABNORMAL LOW (ref 3.87–5.11)
RDW: 15.2 % (ref 11.5–15.5)
WBC: 4.7 10*3/uL (ref 4.0–10.5)

## 2015-07-13 LAB — TROPONIN I: TROPONIN I: 0.78 ng/mL — AB (ref <0.031)

## 2015-07-13 LAB — HEMOGLOBIN A1C
Hgb A1c MFr Bld: 6.5 % — ABNORMAL HIGH (ref 4.8–5.6)
Mean Plasma Glucose: 140 mg/dL

## 2015-07-13 LAB — LIPID PANEL
CHOLESTEROL: 117 mg/dL (ref 0–200)
HDL: 34 mg/dL — AB (ref >40)
LDL Cholesterol: 60 mg/dL (ref 0–99)
TRIGLYCERIDES: 116 mg/dL (ref <150)
Total CHOL/HDL Ratio: 3.4 RATIO
VLDL: 23 mg/dL (ref 0–40)

## 2015-07-13 LAB — GLUCOSE, CAPILLARY
GLUCOSE-CAPILLARY: 83 mg/dL (ref 65–99)
Glucose-Capillary: 158 mg/dL — ABNORMAL HIGH (ref 65–99)

## 2015-07-13 MED ORDER — CLONIDINE HCL 0.2 MG PO TABS: 0.2000 mg | ORAL_TABLET | Freq: Two times a day (BID) | ORAL | Status: DC

## 2015-07-13 MED ORDER — ENSURE ENLIVE PO LIQD
237.0000 mL | Freq: Two times a day (BID) | ORAL | Status: DC
  Administered 2015-07-13: 237 mL via ORAL

## 2015-07-13 MED ORDER — FAMOTIDINE 20 MG PO TABS: 20.0000 mg | ORAL_TABLET | Freq: Every day | ORAL | Status: DC

## 2015-07-13 MED ORDER — ISOSORBIDE MONONITRATE ER 30 MG PO TB24
30.0000 mg | ORAL_TABLET | Freq: Every day | ORAL | Status: DC
  Administered 2015-07-13: 30 mg via ORAL
  Filled 2015-07-13: qty 1

## 2015-07-13 MED ORDER — ISOSORBIDE MONONITRATE ER 30 MG PO TB24: 30.0000 mg | ORAL_TABLET | Freq: Every day | ORAL | Status: DC

## 2015-07-13 MED ORDER — GLUCERNA PO LIQD: 237.0000 mL | Freq: Two times a day (BID) | ORAL | Status: DC

## 2015-07-13 MED ORDER — CARVEDILOL 25 MG PO TABS: 25.0000 mg | ORAL_TABLET | Freq: Two times a day (BID) | ORAL | Status: DC

## 2015-07-13 MED ORDER — NITROGLYCERIN 0.4 MG SL SUBL: 0.4000 mg | SUBLINGUAL_TABLET | SUBLINGUAL | Status: AC | PRN

## 2015-07-13 NOTE — Discharge Summary (Signed)
Physician Discharge Summary  Adrienne Bowman ZOX:[id number] DOB: Dec 04, 1917 DOA: 07/12/2015  PCP: Ruthe Mannan, MD  Admit date: 07/12/2015 Discharge date: 07/13/2015  Recommendations for Outpatient Follow-up:  1. F/u with cardiology in 1-2 weeks for f/u NSTEMI 2. PCP in 1 week for BP check  Discharge Diagnoses:  Principal Problem:   NSTEMI (non-ST elevated myocardial infarction) Active Problems:   Essential hypertension   Chronic kidney disease, stage IV (severe)   PVD (peripheral vascular disease)   CAD (coronary artery disease)   Chest pain   Diabetes mellitus with nephropathy   S/P CABG (coronary artery bypass graft)   HLD (hyperlipidemia)   S/P AKA (above knee amputation) unilateral   Discharge Condition: stable, improved  Diet recommendation: healthy heart  Wt Readings from Last 3 Encounters:  07/13/15 45.9 kg (101 lb 3.1 oz)  05/12/15 49.896 kg (110 lb)  04/24/15 43.999 kg (97 lb)    History of present illness:   Adrienne Bowman is a 81 y.o. female with past medical history significant for hypertension, hyperlipidemia, diabetes mellitus type 2 with nephropathy, chronic kidney disease stage 3-4, peripheral vascular disease is status post left AKA and GERD; who presented to the emergency department secondary to chest pain. Patient reported having some chest pain that started in the middle of her chest and radiated to the left side and also to her left arm; the pain lasted approximately 20-30 minutes and was associated with mild shortness of breath and nausea. Patient denied diaphoresis, vomiting, palpitations, abdominal pain, dysuria, melena, hematochezia, hematemesis, headaches, blurred vision or any other complaints. In the ED, she was found to have some ST depression on the anterior/lateral leads of her EKG and a troponin of 0.04 in the settings of chronic kidney disease. Her chest x-ray demonstrated some vascular congestion but no acute infiltrates.   Hospital Course:    NSTEMI in patient with known CAD and PAD. Agree with medical management given her CKD stage IV and high risk for ARF with contrast exposure. Patient is not a good candidate for dialysis given her frailty, age, and vascular disease.  Her troponins peaked at 0.82 on 8/28 and trended down.  She continued aspirin and her atenolol was changed to carvedilol.  She continued statin (not high dose given age and frailty) and I added imdur.  Her ECHO was reviewed by Dr. Anne Fu who stated her EF appeared approximately stable.   A1c 6.5, TSH 1.6 and lipid profile (mildly low HDL may be due to malnutrition).  After discussion, the patient stated she would like to go home.  She is greater than 48 hours since her MI event occurred and she did not have any concerning heart rhythm problems on telemetry.  She voiced understanding of the possibility of fatal arrhythmia in the setting of recent MI, however, she is DNR and did not want electric cardioversion or resuscitation anyway.  She is already on standard of care therapy for her heart disease.    Essential hypertension: Elevated and uncontrolled on presentation. Patient noncompliant with her medications chronically according to PCP and cardiology notes, however, she stated she had been taking them prior to admission.  Her BP trended down quickly after resuming her home medications.  Her clonidine dose was reduced to 0.2mg  BID. Carvedilol replaced atenolol and hydralazine 100mg  TID remained the same.  Imdur was added.    PVD (peripheral vascular disease): Status post left AKA.  Appeared stable.  Continued aspirin and statin.  Diabetes mellitus with nephropathy: A1c 6.5.  Discontinued sulfonylurea due to risk for hypoglycemia. Consider tolerating slightly higher A1c.    HLD (hyperlipidemia):  Continued statin.  GERD: continuePPI and when necessary GI cocktail.  Chronic systolic heart failure with prior EF of 45-50%.  Official ECHO report pending, but EF per Dr.  Anne Fu appears stable.  Continued statin.    CKD stage IV, creatinine stable near 3.2.    Severe protein calorie malnutrition with BMI 16.3 and 14-lb weight loss in last 6 months, 9-lbs in last 2 months. Recommended supplemental nutrition.    Anemia of renal disease, hemoglobin trended down slightly but no clinical evidence of bleeding.  Recommend outpatient monitoring by PCP.    Consultants:  Cardiology  Procedures:  ECHO  Antibiotics:  none  Discharge Exam: Filed Vitals:   07/13/15 1250  BP: 127/47  Pulse: 71  Temp: 98.2 F (36.8 C)  Resp: 18   Filed Vitals:   07/13/15 0941 07/13/15 1134 07/13/15 1137 07/13/15 1250  BP: 183/52 147/45 147/45 127/47  Pulse:    71  Temp:    98.2 F (36.8 C)  TempSrc:    Oral  Resp:    18  Height:      Weight:      SpO2:    98%     General: Cachectic F, No acute distress, pleasant, oriented  HEENT: NCAT, MMM  Cardiovascular: RRR, nl S1, S2 no mrg, 2+ pulses, warm extremities  Respiratory: CTAB, no increased WOB  Abdomen: NABS, soft, NT/ND  MSK: Normal tone and bulk of RLE, no LEE. Left leg AKA  Neuro: Grossly intact  Discharge Instructions      Discharge Instructions    (HEART FAILURE PATIENTS) Call MD:  Anytime you have any of the following symptoms: 1) 3 pound weight gain in 24 hours or 5 pounds in 1 week 2) shortness of breath, with or without a dry hacking cough 3) swelling in the hands, feet or stomach 4) if you have to sleep on extra pillows at night in order to breathe.    Complete by:  As directed      Call MD for:  difficulty breathing, headache or visual disturbances    Complete by:  As directed      Call MD for:  extreme fatigue    Complete by:  As directed      Call MD for:  hives    Complete by:  As directed      Call MD for:  persistant dizziness or light-headedness    Complete by:  As directed      Call MD for:  persistant nausea and vomiting    Complete by:  As directed      Call MD  for:  severe uncontrolled pain    Complete by:  As directed      Call MD for:  temperature >100.4    Complete by:  As directed      Diet - low sodium heart healthy    Complete by:  As directed      Discharge instructions    Complete by:  As directed   You had a heart attack.  Please make sure you take aspirin every day.  Because of your chronic kidney disease, we have made some changes to your medications.  Please STOP your glyburide and atenolol.  Your diabetes is well enough controlled at this point that you do not need medication.  I have also reduced the doses of your clonidine and your famotidine.  Please  start carvedilol (to replace atenolol) and imdur.  These medications are safe with your kidney disease and will help reduce chest pains.  You may use nitroglycerin as needed for chest pain.  If you need to take more than 2 doses in a row to get your pain to improve, please call 911.  You have lost some weight.  Please start drinking glucerna (or generic equivalent) twice a day to boost your calories.     Increase activity slowly    Complete by:  As directed             Medication List    STOP taking these medications        atenolol 50 MG tablet  Commonly known as:  TENORMIN     glyBURIDE 1.25 MG tablet  Commonly known as:  DIABETA      TAKE these medications        amlodipine-atorvastatin 10-10 MG per tablet  Commonly known as:  CADUET  TAKE ONE TABLET BY MOUTH EVERY DAY     aspirin 81 MG tablet  Take 81 mg by mouth daily.     carvedilol 25 MG tablet  Commonly known as:  COREG  Take 1 tablet (25 mg total) by mouth 2 (two) times daily with a meal.     cloNIDine 0.2 MG tablet  Commonly known as:  CATAPRES  Take 1 tablet (0.2 mg total) by mouth 2 (two) times daily.     famotidine 20 MG tablet  Commonly known as:  PEPCID  Take 1 tablet (20 mg total) by mouth daily.     furosemide 40 MG tablet  Commonly known as:  LASIX  Take 1 tablet (40 mg total) by mouth daily.      GLUCERNA Liqd  Take 237 mLs by mouth 2 (two) times daily between meals.     hydrALAZINE 100 MG tablet  Commonly known as:  APRESOLINE  Take 1 tablet (100 mg total) by mouth 3 (three) times daily.     isosorbide mononitrate 30 MG 24 hr tablet  Commonly known as:  IMDUR  Take 1 tablet (30 mg total) by mouth daily.     multivitamin per tablet  Take 1 tablet by mouth daily.     nitroGLYCERIN 0.4 MG SL tablet  Commonly known as:  NITROSTAT  Place 1 tablet (0.4 mg total) under the tongue every 5 (five) minutes as needed for chest pain.       Follow-up Information    Follow up with Ruthe Mannan, MD.   Specialty:  [facility]   Contact information:   [address] [place] [address] [phone]       Follow up with Rollene Rotunda, MD. Schedule an appointment as soon as possible for a visit in 1 week.   Specialty:  Cardiology   Contact information:   [address] [place] [address] [phone]        The results of significant diagnostics from this hospitalization (including imaging, microbiology, ancillary and laboratory) are listed below for reference.    Significant Diagnostic Studies: Dg Chest 2 View  07/12/2015   CLINICAL DATA:  Left chest pain since last night.  EXAM: CHEST  2 VIEW  COMPARISON:  January 27, 2004  FINDINGS: The heart size and mediastinal contours are stable. Patient status post prior median sternotomy and CABG. There is mild interstitial pulmonary edema. There is mild atelectasis of bilateral lung bases. There is no focal pneumonia or pleural effusion.  The visualized skeletal structures are unremarkable.  IMPRESSION: Mild bilateral interstitial edema.   Electronically Signed   By: Sherian Rein M.D.   On: 07/12/2015 12:43    Microbiology: No results found for this or any previous visit (from the past 240 hour(s)).   Labs: Basic Metabolic Panel:  Recent Labs Lab 07/12/15 1140 07/12/15 1645 07/13/15 0412  NA 139  --   139  K 3.4*  --  3.6  CL 105  --  104  CO2 23  --  26  GLUCOSE 295*  --  132*  BUN 74*  --  66*  CREATININE 3.40* 3.27* 3.18*  CALCIUM 9.1  --  8.6*   Liver Function Tests: No results for input(s): AST, ALT, ALKPHOS, BILITOT, PROT, ALBUMIN in the last 168 hours. No results for input(s): LIPASE, AMYLASE in the last 168 hours. No results for input(s): AMMONIA in the last 168 hours. CBC:  Recent Labs Lab 07/12/15 1140 07/12/15 1645 07/13/15 0412  WBC 7.0 6.0 4.7  HGB 10.1* 9.3* 8.9*  HCT 31.1* 28.0* 27.2*  MCV 85.2 84.6 85.0  PLT 211 177 173   Cardiac Enzymes:  Recent Labs Lab 07/12/15 1140 07/12/15 1645 07/12/15 2215 07/13/15 0412  TROPONINI 0.07* 0.32* 0.82* 0.78*   BNP: BNP (last 3 results) No results for input(s): BNP in the last 8760 hours.  ProBNP (last 3 results) No results for input(s): PROBNP in the last 8760 hours.  CBG:  Recent Labs Lab 07/12/15 1635 07/12/15 2153 07/13/15 1248  GLUCAP 135* 123* 158*    Time coordinating discharge: 35 minutes  Signed:  Jena Tegeler  [facility] 07/13/2015, 3:59 PM

## 2015-07-13 NOTE — Progress Notes (Signed)
Discharge Summary gone over with patient and family members. All questions answered to patients satisfaction. Patient knows to pick up medications from [facility] in [place]. IV removed, telemetry discontinued. Patient discharged home with family by wheelchair.

## 2015-07-13 NOTE — Progress Notes (Signed)
This RN ambulated with patient in halls. Patient on room air. Patient walked approx 100 feet with walker and prosthetic leg for assistance. Patient tolerated walk well. No chest pain, no SOB.   Patient states "I use my walker at home to get all around and do the cleaning".  Patient states, " I need to get my leg (prosthetic leg) cut down, its too tall."  Patient's prosthetic leg is significantly longer than her right leg which could be a safety hazard.  Pt educated on falls, and states that she  " just needs to go down to [address] and have them cut it down".

## 2015-07-13 NOTE — Progress Notes (Signed)
Initial Nutrition Assessment  DOCUMENTATION CODES:   Severe malnutrition in context of chronic illness, Underweight  INTERVENTION:    Ensure Enlive PO BID, each supplement provides 350 kcal and 20 grams of protein  NUTRITION DIAGNOSIS:   Malnutrition related to chronic illness as evidenced by severe depletion of body fat, severe depletion of muscle mass.  GOAL:   Patient will meet greater than or equal to 90% of their needs  MONITOR:   PO intake, Supplement acceptance, Labs, Weight trends, Skin  REASON FOR ASSESSMENT:   Consult Assessment of nutrition requirement/status  ASSESSMENT:   81 y.o. female with past medical history significant for hypertension, hyperlipidemia, diabetes mellitus type 2 with nephropathy, chronic kidney disease stage 3-4, peripheral vascular disease is status post left AKA and GERD; who presented to the emergency department secondary to chest pain.  Patient reports that she's always been small and that she has been eating okay. She has lost 8% of usual weight in the past 2 months. She likes Ensure supplements okay. PO intake of supper last night was 75%. Nutrition-Focused physical exam completed. Findings are severe fat depletion, severe muscle depletion, and no edema. Patient with severe PCM.   Diet Order:  Diet Heart Room service appropriate?: Yes; Fluid consistency:: Thin Diet - low sodium heart healthy  Skin:  Reviewed, no issues  Last BM:  8/28  Height:   Ht Readings from Last 1 Encounters:  07/13/15  (1.676 m)    Weight:   Wt Readings from Last 1 Encounters:  07/13/15 101 lb 3.1 oz (45.9 kg)    Ideal Body Weight:  54.4 kg  BMI:  17.76 (underweight) using adjusted weight for AKA  Estimated Nutritional Needs:   Kcal:  1400-1600  Protein:  60-70 gm  Fluid:  >/= 1.5 L  EDUCATION NEEDS:   No education needs identified at this time   Joaquin Courts, RD, LDN, CNSC Pager [phone] After Hours Pager [phone]

## 2015-07-13 NOTE — Progress Notes (Signed)
Echocardiogram 2D Echocardiogram has been performed.  Tye Savoy 07/13/2015, 2:36 PM

## 2015-07-13 NOTE — Consult Note (Signed)
Admit date: 07/12/2015 Referring Physician  Dr. Malachi Bonds Primary Physician Ruthe Mannan, MD Primary Cardiologist  Dr. Antoine Poche Reason for Consultation  Elevated troponin  HPI: 81 year old with coronary artery disease status post CABG 2002, peripheral vascular disease severe with left above-knee amputation, difficult to control hypertension, diabetic nephropathy, chronic kidney disease stage IV, creatinine 3.1 Who presented to the emergency department with complaint of chest pain which started on the night of 07/11/15 greater than 24 hours ago in the middle of her chest radiating to her left arm which lasted approximately 20 minutes and may been associated with some shortness of breath. She came to the emergency department and was found to have ST segment depression inferior laterally in troponin was mildly elevated. Cardiology was called by ER physician to discuss case. Her hypertension was noted to be uncontrolled on admission and Catapres was adjusted, atenolol was switched to carvedilol.  Her last cardiac catheterization was 2002, ejection fraction 45-50%, managed conservatively, no longer on ACE inhibitor or Aldactone given her progressive kidney disease. In the past, attempted been made to try to increase her hydralazine and increase her other antihypertensives however she did not wish to comply with this.   PMH:   Past Medical History  Diagnosis Date  . CHF (congestive heart failure) 2002    acute CHF while getting lower extremilty arteriograms /  Led to CABG  x6  . Hypertension     very difficult to control  . PVD (peripheral vascular disease)     extensive PVD / left AKA  . Murmur, heart     soft systolic outflow murmur and a grade 2/6 murmur of the aortic insufficiency  . S/P AKA (above knee amputation)     left AKA with prosthesis  . DIABETES MELLITUS, TYPE II   . NEPHROPATHY, DIABETIC   . Coronary artery disease 2002    CABG x6 / left internal mammary artery graft to the LAD, a  saphenous vein graft to the diagonal, a sequential saphenous vein graft to the 1st and 2nd obtuse marginal branches, and a sequential saphenous vein graft the the distal right coronary artery and posterior descending  . Anemia   . CKD (chronic kidney disease)   . Advanced age     PSH:   Past Surgical History  Procedure Laterality Date  . Coronary artery bypass graft  2002    x6 per Dr. Laneta Simmers  . Eye surgery  1987 & 1988     left eye  . Vascular surgery      multiple peripheral vascular surgeries  . Left aka  2006  . Right renal artery stent  2004  . Rfpbpg  2002  . Lower extremity angiogram  February 21, 2012  . Pr vein bypass graft,aorto-fem-pop  Oct. 17, 2012    Right  Fem-Distal post. Tibial artery BPG.  . Lower extremity angiogram N/A 02/21/2012    Procedure: LOWER EXTREMITY ANGIOGRAM;  Surgeon: Nada Libman, MD;  Location: [facility] CATH LAB;  Service: Cardiovascular;  Laterality: N/A;  . Lower extremity angiogram Right 12/25/2012    Procedure: LOWER EXTREMITY ANGIOGRAM;  Surgeon: Nada Libman, MD;  Location: [facility] CATH LAB;  Service: Cardiovascular;  Laterality: Right;  rt leg angio  . Abdominal angiogram  12/25/2012    Procedure: ABDOMINAL ANGIOGRAM;  Surgeon: Nada Libman, MD;  Location: [facility] CATH LAB;  Service: Cardiovascular;;   Allergies:  Review of patient's allergies indicates no known allergies. Prior to Admit Meds:   Prior to  Admission medications   Medication Sig Start Date End Date Taking? Authorizing Provider  amlodipine-atorvastatin (CADUET) 10-10 MG per tablet TAKE ONE TABLET BY MOUTH EVERY DAY 09/03/14  Yes Tonny Bollman, MD  aspirin 81 MG tablet Take 81 mg by mouth daily.     Yes Historical Provider, MD  atenolol (TENORMIN) 50 MG tablet TAKE ONE TABLET BY MOUTH ONCE DAILY 06/09/15  Yes Rollene Rotunda, MD  cloNIDine (CATAPRES) 0.3 MG tablet TAKE ONE TABLET BY MOUTH TWICE DAILY 03/30/15  Yes Dyann Kief, PA-C  famotidine (PEPCID) 20 MG tablet Take 1 tablet (20 mg total)  by mouth 2 (two) times daily. 12/11/14  Yes Rollene Rotunda, MD  furosemide (LASIX) 40 MG tablet Take 1 tablet (40 mg total) by mouth daily. 12/10/14  Yes Dianne Dun, MD  glyBURIDE (DIABETA) 1.25 MG tablet Take 1 tablet (1.25 mg total) by mouth daily with breakfast. Needs an appointment with Dr and labs, for any additional refills. 12/18/14  Yes Dianne Dun, MD  hydrALAZINE (APRESOLINE) 100 MG tablet Take 1 tablet (100 mg total) by mouth 3 (three) times daily. 03/05/15  Yes Rollene Rotunda, MD  multivitamin [facility]) per tablet Take 1 tablet by mouth daily.     Yes Historical Provider, MD   Fam HX:    Family History  Problem Relation Age of Onset  . Coronary artery disease      prevalent in sibling  . Heart disease Mother   . Hypertension Daughter   . Cancer Son     Lung  and  Throat  . Heart attack Son   . Hyperlipidemia Daughter   . Hypertension Daughter   . Hypertension Daughter   . Hypertension Daughter    Social HX:    Social History   Social History  . Marital Status: Widowed    Spouse Name: N/A  . Number of Children: N/A  . Years of Education: N/A   Occupational History  . Not on file.   Social History Main Topics  . Smoking status: Never Smoker   . Smokeless tobacco: Never Used  . Alcohol Use: No  . Drug Use: No  . Sexual Activity: No   Other Topics Concern  . Not on file   Social History Narrative     ROS: Denies syncope, orthopnea, PND, fever, bleeding All 11 ROS were addressed and are negative except what is stated in the HPI   Physical Exam: Blood pressure 156/45, pulse 73, temperature 98.3 F (36.8 C), temperature source Oral, resp. rate 22, height 5\' 6"  (1.676 m), weight 101 lb 3.1 oz (45.9 kg), SpO2 98 %.   General: Elderly, thin, in no acute distress Head: Eyes PERRLA, No xanthomas.   Normal cephalic and atramatic  Lungs:   Mild crackles heard at bases bilaterally, good respiratory effort. Heart:   HRRR S1 S2 Pulses are 2+ & equal. 2/6 systolic  left lower sternal border murmur, no rubs, gallops.  No carotid bruit. No JVD.  No abdominal bruits.  Abdomen: Bowel sounds are positive, abdomen soft and non-tender without masses. No hepatosplenomegaly. Msk:  Back normal. Normal strength and tone for age. Extremities:  No clubbing, cyanosis or edema.  Difficult to palpate distal pulses right leg. Left AKA Neuro: Alert and oriented X 3, non-focal, MAE x 4 GU: Deferred Rectal: Deferred Psych:  Good affect, responds appropriately, pleasant      Labs: Lab Results  Component Value Date   WBC 4.7 07/13/2015   HGB 8.9* 07/13/2015  HCT 27.2* 07/13/2015   MCV 85.0 07/13/2015   PLT 173 07/13/2015     Recent Labs Lab 07/13/15 0412  NA 139  K 3.6  CL 104  CO2 26  BUN 66*  CREATININE 3.18*  CALCIUM 8.6*  GLUCOSE 132*    Recent Labs  07/12/15 1140 07/12/15 1645 07/12/15 2215 07/13/15 0412  TROPONINI 0.07* 0.32* 0.82* 0.78*   Lab Results  Component Value Date   CHOL 117 07/13/2015   HDL 34* 07/13/2015   LDLCALC 60 07/13/2015   TRIG 116 07/13/2015   No results found for: DDIMER   Radiology:  Dg Chest 2 View  07/12/2015   CLINICAL DATA:  Left chest pain since last night.  EXAM: CHEST  2 VIEW  COMPARISON:  January 27, 2004  FINDINGS: The heart size and mediastinal contours are stable. Patient status post prior median sternotomy and CABG. There is mild interstitial pulmonary edema. There is mild atelectasis of bilateral lung bases. There is no focal pneumonia or pleural effusion. The visualized skeletal structures are unremarkable.  IMPRESSION: Mild bilateral interstitial edema.   Electronically Signed   By: Sherian Rein M.D.   On: 07/12/2015 12:43   Personally viewed.  EKG:  Sinus rhythm, inferior lateral ST segment depression, T-wave inversion, no change from prior EKG Personally viewed.   Scheduled Meds: . amLODipine  10 mg Oral Daily   And  . atorvastatin  10 mg Oral Daily  . aspirin EC  81 mg Oral Daily  .  carvedilol  25 mg Oral BID WC  . cloNIDine  0.2 mg Oral BID  . furosemide  40 mg Oral Daily  . glyBURIDE  1.25 mg Oral Q breakfast  . heparin  5,000 Units Subcutaneous 3 times per day  . hydrALAZINE  100 mg Oral TID  . insulin aspart  0-9 Units Subcutaneous TID WC  . isosorbide mononitrate  30 mg Oral Daily  . multivitamin with minerals  1 tablet Oral Daily  . pantoprazole  40 mg Oral Q1200   Continuous Infusions:  PRN Meds:.acetaminophen, gi cocktail, morphine injection, nitroGLYCERIN, ondansetron (ZOFRAN) IV   ASSESSMENT/PLAN:    81 year old female with chronic kidney disease stage IV, creatinine 3.1 with severe coronary artery disease status post CABG 2002, severe peripheral vascular disease status post left AKA, diabetes with diabetic nephropathy, uncontrolled hypertension in part possibly secondary to medication noncompliance here with episode of chest discomfort, elevated troponin currently trending down (peak 0.82) consistent with non-ST elevation myocardial infarction.  1. Non-ST elevation myocardial infarction  - Continue with medical management. Noninvasive strategy (advanced age, chronic kidney disease with dramatically increased risk of adverse outcome from cardiac catheterization).  - Thankfully, troponin was minimally elevated. In fact, this could be in part, enhanced by chronic kidney disease as well as uncontrolled hypertension i.e. degree of demand ischemia. Nonetheless, increase in troponin portends increase morbidity/mortality.  - Since her troponin is currently trending downward and her chest discomfort initiated greater than 24 hours ago I'm comfortable with continuing with conservative management, no IV heparin (increased bleeding risk, thin weight 101 pounds, elderly 81 year old with hypertension at times, risk may outweigh benefit).  - Continue beta blocker, statin, aspirin, Nitrates. No ACE inhibitor because of chronic kidney disease  - Echocardiogram to assess LV  function ordered  2. Coronary artery disease  -CABG 2002  3. Chronic kidney disease stage IV  - Creatinine 3.1  4. Uncontrolled hypertension/essential hypertension  - I agree with change in medications especially atenolol over to carvedilol  given her underlying renal disease. Also on Catapres. She's had issues in the past with controlling her hypertension. Please see Kary Kos note as an outpatient.  5. Ischemic cardiomyopathy  - Prior ejection fraction 45-50%.  - Low-dose furosemide, hydralazine, isosorbide, beta blocker  6. Severe peripheral vascular disease  - AKA left leg  7. Diabetic nephropathy  - Avoid NSAIDs.  We will continue to follow.  Donato Schultz, MD  07/13/2015  8:07 AM

## 2015-07-14 ENCOUNTER — Other Ambulatory Visit: Payer: Self-pay | Admitting: Cardiology

## 2015-07-14 ENCOUNTER — Other Ambulatory Visit: Payer: Self-pay | Admitting: Family Medicine

## 2015-07-14 ENCOUNTER — Telehealth: Payer: Self-pay | Admitting: *Deleted

## 2015-07-14 NOTE — Telephone Encounter (Signed)
Transition Care Management Follow-up Telephone Call   Date discharged? 07/13/15   How have you been since you were released from the hospital? Feeling weak, but improved.   Do you understand why you were in the hospital? yes   Do you understand the discharge instructions? yes   Where were you discharged to? Home   Items Reviewed:  Medications reviewed: yes  Allergies reviewed: yes  Dietary changes reviewed: no  Referrals reviewed: yes, cardiology   Functional Questionnaire:   Activities of Daily Living (ADLs):   She states they are independent in the following: ambulation, bathing and hygiene, feeding, continence, grooming, toileting and dressing States they require assistance with the following: None   Any transportation issues/concerns?: no   Any patient concerns? no   Confirmed importance and date/time of follow-up visits scheduled yes, 07/21/15 @ 1130  Provider Appointment booked with Ruthe Mannan, MD  Confirmed with patient if condition begins to worsen call PCP or go to the ER.  Patient was given the office number and encouraged to call back with question or concerns.  : yes

## 2015-07-15 NOTE — Telephone Encounter (Signed)
Pt had recent labs at the hospital. pls advise

## 2015-07-21 ENCOUNTER — Encounter: Payer: Self-pay | Admitting: Family Medicine

## 2015-07-21 ENCOUNTER — Ambulatory Visit: Payer: Medicare Other | Admitting: Cardiology

## 2015-07-21 ENCOUNTER — Ambulatory Visit (INDEPENDENT_AMBULATORY_CARE_PROVIDER_SITE_OTHER): Payer: Medicare Other | Admitting: Family Medicine

## 2015-07-21 VITALS — BP 182/52 | HR 70 | Temp 97.5°F | Wt 112.5 lb

## 2015-07-21 DIAGNOSIS — E1121 Type 2 diabetes mellitus with diabetic nephropathy: Secondary | ICD-10-CM

## 2015-07-21 DIAGNOSIS — Z89612 Acquired absence of left leg above knee: Secondary | ICD-10-CM

## 2015-07-21 DIAGNOSIS — I214 Non-ST elevation (NSTEMI) myocardial infarction: Secondary | ICD-10-CM

## 2015-07-21 DIAGNOSIS — Z951 Presence of aortocoronary bypass graft: Secondary | ICD-10-CM | POA: Diagnosis not present

## 2015-07-21 DIAGNOSIS — I739 Peripheral vascular disease, unspecified: Secondary | ICD-10-CM

## 2015-07-21 DIAGNOSIS — I10 Essential (primary) hypertension: Secondary | ICD-10-CM

## 2015-07-21 DIAGNOSIS — I70209 Unspecified atherosclerosis of native arteries of extremities, unspecified extremity: Secondary | ICD-10-CM

## 2015-07-21 DIAGNOSIS — L98499 Non-pressure chronic ulcer of skin of other sites with unspecified severity: Secondary | ICD-10-CM

## 2015-07-21 DIAGNOSIS — N184 Chronic kidney disease, stage 4 (severe): Secondary | ICD-10-CM

## 2015-07-21 MED ORDER — FUROSEMIDE 40 MG PO TABS: 40.0000 mg | ORAL_TABLET | Freq: Every day | ORAL | Status: DC

## 2015-07-21 MED ORDER — CLONIDINE HCL 0.2 MG PO TABS: 0.2000 mg | ORAL_TABLET | Freq: Two times a day (BID) | ORAL | Status: DC

## 2015-07-21 MED ORDER — FAMOTIDINE 20 MG PO TABS: 20.0000 mg | ORAL_TABLET | Freq: Every day | ORAL | Status: DC

## 2015-07-21 NOTE — Assessment & Plan Note (Signed)
Cr stable. Not a candidate for HD.

## 2015-07-21 NOTE — Progress Notes (Signed)
Pre visit review using our clinic review tool, if applicable. No additional management support is needed unless otherwise documented below in the visit note. 

## 2015-07-21 NOTE — Assessment & Plan Note (Signed)
Agree with d/c'ing sulfonylurea. a1c very reasonable for her comorbities.

## 2015-07-21 NOTE — Assessment & Plan Note (Signed)
Keep appt with cardiology. No changes made to rxs today.

## 2015-07-21 NOTE — Patient Instructions (Signed)
Good to see you. Please keep your appointment with your cardiologist.

## 2015-07-21 NOTE — Assessment & Plan Note (Signed)
Chronically elevated and elevated again today. See previous notes- pt refuses to change rxs.  Compliance has been an issue.

## 2015-07-21 NOTE — Progress Notes (Signed)
Subjective:   Patient ID: Adrienne Bowman, female    DOB: [birth year], 81 y.o.   MRN: [id number]  Adrienne Bowman is a pleasant 81 y.o. year old female who presents to clinic today with Hospitalization Follow-up and Fatigue  on 07/21/2015  HPI:  Admitted to [facility] 8/28- 07/13/15. Notes reviewed.  Past medical history significant for hypertension, hyperlipidemia, diabetes mellitus type 2 with nephropathy, chronic kidney disease stage 3-4, peripheral vascular disease is status post left AKA and GERD; who presented to the emergency department with chest pain which was associated with mild shortness of breath and nausea.   In the ED, she was found to have some ST depression on the anterior/lateral leads of her EKG and a troponin of 0.04 in the settings of chronic kidney disease. Her chest x-ray demonstrated some vascular congestion but no acute infiltrates.   Diagnosed with NSTEMI- cardiology consulted.  Given her comorbidities, treated with medical management. Currently not having chest pain.  HTN- Known noncompliance with rxs, BP was well controlled in hospital once she was restarted on home rxs. Atenolol stopped and started on Carvedilol, continued on Imdur, hydralazine, clonidine (increased to 0.2 mg twice daily), and caduet.  a1c good- glyburide d/c'd.  Cr stable at discharge- CKD stage IV Lab Results  Component Value Date   CREATININE 3.18* 07/13/2015   Lab Results  Component Value Date   WBC 4.7 07/13/2015   HGB 8.9* 07/13/2015   HCT 27.2* 07/13/2015   MCV 85.0 07/13/2015   PLT 173 07/13/2015   Dg Chest 2 View  07/12/2015   CLINICAL DATA:  Left chest pain since last night.  EXAM: CHEST  2 VIEW  COMPARISON:  January 27, 2004  FINDINGS: The heart size and mediastinal contours are stable. Patient status post prior median sternotomy and CABG. There is mild interstitial pulmonary edema. There is mild atelectasis of bilateral lung bases. There is no focal pneumonia or pleural effusion.  The visualized skeletal structures are unremarkable.  IMPRESSION: Mild bilateral interstitial edema.   Electronically Signed   By: Sherian Rein M.D.   On: 07/12/2015 12:43   Has follow up with cardiology scheduled for 9/22.  Current Outpatient Prescriptions on File Prior to Visit  Medication Sig Dispense Refill  . amlodipine-atorvastatin (CADUET) 10-10 MG per tablet TAKE ONE TABLET BY MOUTH EVERY DAY 90 tablet 3  . aspirin 81 MG tablet Take 81 mg by mouth daily.      . carvedilol (COREG) 25 MG tablet Take 1 tablet (25 mg total) by mouth 2 (two) times daily with a meal. 60 tablet 0  . cloNIDine (CATAPRES) 0.2 MG tablet Take 1 tablet (0.2 mg total) by mouth 2 (two) times daily. 60 tablet 0  . famotidine (PEPCID) 20 MG tablet Take 1 tablet (20 mg total) by mouth daily. 30 tablet 0  . furosemide (LASIX) 40 MG tablet TAKE ONE TABLET BY MOUTH ONCE DAILY 30 tablet 0  . GLUCERNA (GLUCERNA) LIQD Take 237 mLs by mouth 2 (two) times daily between meals. 60 Can 0  . hydrALAZINE (APRESOLINE) 100 MG tablet TAKE ONE TABLET BY MOUTH THREE TIMES DAILY 90 tablet 3  . isosorbide mononitrate (IMDUR) 30 MG 24 hr tablet Take 1 tablet (30 mg total) by mouth daily. 30 tablet 0  . multivitamin (THERAGRAN) per tablet Take 1 tablet by mouth daily.      . nitroGLYCERIN (NITROSTAT) 0.4 MG SL tablet Place 1 tablet (0.4 mg total) under the tongue every 5 (five) minutes as  needed for chest pain. 30 tablet 0   No current facility-administered medications on file prior to visit.    No Known Allergies  Past Medical History  Diagnosis Date  . CHF (congestive heart failure) 2002    acute CHF while getting lower extremilty arteriograms /  Led to CABG  x6  . Hypertension     very difficult to control  . PVD (peripheral vascular disease)     extensive PVD / left AKA  . Murmur, heart     soft systolic outflow murmur and a grade 2/6 murmur of the aortic insufficiency  . S/P AKA (above knee amputation)     left AKA with  prosthesis  . DIABETES MELLITUS, TYPE II   . NEPHROPATHY, DIABETIC   . Coronary artery disease 2002    CABG x6 / left internal mammary artery graft to the LAD, a saphenous vein graft to the diagonal, a sequential saphenous vein graft to the 1st and 2nd obtuse marginal branches, and a sequential saphenous vein graft the the distal right coronary artery and posterior descending  . Anemia   . CKD (chronic kidney disease)   . Advanced age     Past Surgical History  Procedure Laterality Date  . Coronary artery bypass graft  2002    x6 per Dr. Laneta Simmers  . Eye surgery  1987 & 1988     left eye  . Vascular surgery      multiple peripheral vascular surgeries  . Left aka  2006  . Right renal artery stent  2004  . Rfpbpg  2002  . Lower extremity angiogram  February 21, 2012  . Pr vein bypass graft,aorto-fem-pop  Oct. 17, 2012    Right  Fem-Distal post. Tibial artery BPG.  . Lower extremity angiogram N/A 02/21/2012    Procedure: LOWER EXTREMITY ANGIOGRAM;  Surgeon: Nada Libman, MD;  Location: [facility] CATH LAB;  Service: Cardiovascular;  Laterality: N/A;  . Lower extremity angiogram Right 12/25/2012    Procedure: LOWER EXTREMITY ANGIOGRAM;  Surgeon: Nada Libman, MD;  Location: [facility] CATH LAB;  Service: Cardiovascular;  Laterality: Right;  rt leg angio  . Abdominal angiogram  12/25/2012    Procedure: ABDOMINAL ANGIOGRAM;  Surgeon: Nada Libman, MD;  Location: [facility] CATH LAB;  Service: Cardiovascular;;    Family History  Problem Relation Age of Onset  . Coronary artery disease      prevalent in sibling  . Heart disease Mother   . Hypertension Daughter   . Cancer Son     Lung  and  Throat  . Heart attack Son   . Hyperlipidemia Daughter   . Hypertension Daughter   . Hypertension Daughter   . Hypertension Daughter     Social History   Social History  . Marital Status: Widowed    Spouse Name: N/A  . Number of Children: N/A  . Years of Education: N/A   Occupational History  . Not on file.     Social History Main Topics  . Smoking status: Never Smoker   . Smokeless tobacco: Never Used  . Alcohol Use: No  . Drug Use: No  . Sexual Activity: No   Other Topics Concern  . Not on file   Social History Narrative   The PMH, PSH, Social History, Family History, Medications, and allergies have been reviewed in [facility], and have been updated if relevant.     Review of Systems  Constitutional: Negative.   HENT: Negative.   Eyes: Negative.  Respiratory: Negative.   Cardiovascular: Negative.   Gastrointestinal: Negative.   Endocrine: Negative.   Genitourinary: Negative.   Musculoskeletal: Negative.   Skin: Negative.   Allergic/Immunologic: Negative.   Neurological: Negative.   Hematological: Negative.   All other systems reviewed and are negative.      Objective:    BP 182/52 mmHg  Pulse 70  Temp(Src) 97.5 F (36.4 C) (Oral)  Wt 112 lb 8 oz (51.03 kg)  SpO2 98%  BP Readings from Last 3 Encounters:  07/21/15 182/52  07/13/15 168/54  05/12/15 208/64    Physical Exam  Constitutional: She is oriented to person, place, and time. No distress.  HENT:  Head: Normocephalic.  Eyes: Conjunctivae are normal.  Neck: Normal range of motion.  Cardiovascular: Normal rate.   Pulmonary/Chest: Effort normal.  Musculoskeletal:  Normal tone and bulk of RLE, no LEE. Left leg AKA  Neurological: She is alert and oriented to person, place, and time.  Skin: Skin is warm and dry. She is not diaphoretic.  Psychiatric: She has a normal mood and affect. Her behavior is normal. Thought content normal.  Nursing note and vitals reviewed.      Assessment & Plan:   NSTEMI (non-ST elevated myocardial infarction)  S/P CABG (coronary artery bypass graft)  S/P AKA (above knee amputation) unilateral, left  Essential hypertension  Atherosclerotic PVD with ulceration No Follow-up on file.

## 2015-07-28 ENCOUNTER — Telehealth: Payer: Self-pay | Admitting: Family Medicine

## 2015-07-28 NOTE — Telephone Encounter (Signed)
Formed signed and returned to [place]

## 2015-07-28 NOTE — Telephone Encounter (Signed)
Daughter dropped off FMLA paperwork In [place] IN BOX  For review and signature

## 2015-07-29 NOTE — Telephone Encounter (Signed)
Spoke to ms hines daughter  She will have ms hines call office FMLA paperwork ready  Ms hines needs to sign and date Does she want to come back to office to sign paperwork??

## 2015-07-31 NOTE — Telephone Encounter (Signed)
Spoke to daughter she stated Ms hines will come by 9/16 to sign paperwork

## 2015-07-31 NOTE — Telephone Encounter (Signed)
Ms Adrienne Bowman came by to sign paperwork Copy faxed to [phone]  Copy to pt Copy to scan Copy to file  Pt aware

## 2015-08-04 NOTE — Telephone Encounter (Signed)
Lupita Leash @ [facility] HR department called  She needed  # 3 medical facts in of take to appointments  In your IN BOX for review and initials beside what was changed

## 2015-08-04 NOTE — Telephone Encounter (Signed)
Initialed and in my box.

## 2015-08-05 NOTE — Telephone Encounter (Signed)
Paper work faxed 9/20/[facility]

## 2015-08-06 ENCOUNTER — Ambulatory Visit (INDEPENDENT_AMBULATORY_CARE_PROVIDER_SITE_OTHER): Payer: Medicare Other | Admitting: Cardiology

## 2015-08-06 ENCOUNTER — Encounter: Payer: Self-pay | Admitting: Cardiology

## 2015-08-06 VITALS — BP 154/50 | HR 72 | Ht 61.0 in | Wt 110.2 lb

## 2015-08-06 DIAGNOSIS — N184 Chronic kidney disease, stage 4 (severe): Secondary | ICD-10-CM

## 2015-08-06 DIAGNOSIS — I208 Other forms of angina pectoris: Secondary | ICD-10-CM

## 2015-08-06 DIAGNOSIS — I10 Essential (primary) hypertension: Secondary | ICD-10-CM

## 2015-08-06 DIAGNOSIS — R0789 Other chest pain: Secondary | ICD-10-CM | POA: Diagnosis not present

## 2015-08-06 DIAGNOSIS — I5022 Chronic systolic (congestive) heart failure: Secondary | ICD-10-CM

## 2015-08-06 DIAGNOSIS — I2510 Atherosclerotic heart disease of native coronary artery without angina pectoris: Secondary | ICD-10-CM | POA: Diagnosis not present

## 2015-08-06 MED ORDER — ISOSORBIDE MONONITRATE ER 60 MG PO TB24: 60.0000 mg | ORAL_TABLET | Freq: Every day | ORAL | Status: DC

## 2015-08-06 NOTE — Patient Instructions (Signed)
Your physician recommends that you schedule a follow-up appointment in: WITH EXTENDER WHEN DR HOCHREIN IS IN THE OFFICE IN 2 WEEKS  INCREASE ISOSORBIDE TO 60 MG ONCE DAILY= 2 OF THE 30 MG TABLETS ONCE DAILY

## 2015-08-06 NOTE — Progress Notes (Signed)
Cardiology Office Note   Date:  08/06/2015   ID:  Adrienne Bowman, DOB 12-Mar-1918, MRN [id number]  PCP:  Ruthe Mannan, MD  Cardiologist:  Dr. Antoine Poche    Chief Complaint  Patient presents with  . Shortness of Breath    anytime/no edema  . Chest Pain    last a few minutes/occurs at rest      History of Present Illness: Adrienne Bowman is a 81 y.o. female who presents for post hospital d/c 07/13/15, for chest pain-NSTEMI with pk troponin of 0.82 and SOB. Her HTN was not controlled on admit.   She has a history of coronary artery disease status post CABG 2002, peripheral vascular disease severe with left above-knee amputation, difficult to control hypertension, diabetic nephropathy, chronic kidney disease stage IV, creatinine 3.1.  Her last cardiac catheterization was 2002-prior to CABG, ejection fraction 45-50%, managed conservatively, no longer on ACE inhibitor or Aldactone given her progressive kidney disease.  Plan in hospital was to continue medical management.   ECHO:Study Conclusions  - Left ventricle: The cavity size was normal. There was moderate concentric hypertrophy. Systolic function was normal. The estimated ejection fraction was in the range of 55% to 60%. Wall motion was normal; there were no regional wall motion abnormalities. Doppler parameters are consistent with abnormal left ventricular relaxation (grade 1 diastolic dysfunction). Doppler parameters are consistent with high ventricular filling pressure. - Aortic valve: There was mild regurgitation. - Mitral valve: The findings are consistent with mild stenosis. There was mild regurgitation. - Left atrium: The atrium was moderately dilated. - Right atrium: The atrium was mildly dilated   TODAY: she complains of chest pressure that is uncomfortable for her.  She took a NTG last pm with relief. No associated nausea or diaphoresis and no radiation.      Past Medical History  Diagnosis Date  .  CHF (congestive heart failure) 2002    acute CHF while getting lower extremilty arteriograms /  Led to CABG  x6  . Hypertension     very difficult to control  . PVD (peripheral vascular disease)     extensive PVD / left AKA  . Murmur, heart     soft systolic outflow murmur and a grade 2/6 murmur of the aortic insufficiency  . S/P AKA (above knee amputation)     left AKA with prosthesis  . DIABETES MELLITUS, TYPE II   . NEPHROPATHY, DIABETIC   . Coronary artery disease 2002    CABG x6 / left internal mammary artery graft to the LAD, a saphenous vein graft to the diagonal, a sequential saphenous vein graft to the 1st and 2nd obtuse marginal branches, and a sequential saphenous vein graft the the distal right coronary artery and posterior descending  . Anemia   . CKD (chronic kidney disease)   . Advanced age     Past Surgical History  Procedure Laterality Date  . Coronary artery bypass graft  2002    x6 per Dr. Laneta Simmers  . Eye surgery  1987 & 1988     left eye  . Vascular surgery      multiple peripheral vascular surgeries  . Left aka  2006  . Right renal artery stent  2004  . Rfpbpg  2002  . Lower extremity angiogram  February 21, 2012  . Pr vein bypass graft,aorto-fem-pop  Oct. 17, 2012    Right  Fem-Distal post. Tibial artery BPG.  . Lower extremity angiogram N/A 02/21/2012    Procedure:  LOWER EXTREMITY ANGIOGRAM;  Surgeon: Nada Libman, MD;  Location: [facility] CATH LAB;  Service: Cardiovascular;  Laterality: N/A;  . Lower extremity angiogram Right 12/25/2012    Procedure: LOWER EXTREMITY ANGIOGRAM;  Surgeon: Nada Libman, MD;  Location: [facility] CATH LAB;  Service: Cardiovascular;  Laterality: Right;  rt leg angio  . Abdominal angiogram  12/25/2012    Procedure: ABDOMINAL ANGIOGRAM;  Surgeon: Nada Libman, MD;  Location: [facility] CATH LAB;  Service: Cardiovascular;;     Current Outpatient Prescriptions  Medication Sig Dispense Refill  . amlodipine-atorvastatin (CADUET) 10-10 MG per tablet  TAKE ONE TABLET BY MOUTH EVERY DAY 90 tablet 3  . aspirin 81 MG tablet Take 81 mg by mouth daily.      . carvedilol (COREG) 25 MG tablet Take 1 tablet (25 mg total) by mouth 2 (two) times daily with a meal. 60 tablet 0  . cloNIDine (CATAPRES) 0.2 MG tablet Take 1 tablet (0.2 mg total) by mouth 2 (two) times daily. 60 tablet 0  . famotidine (PEPCID) 20 MG tablet Take 1 tablet (20 mg total) by mouth daily. 30 tablet 0  . furosemide (LASIX) 40 MG tablet Take 1 tablet (40 mg total) by mouth daily. 30 tablet 3  . GLUCERNA (GLUCERNA) LIQD Take 237 mLs by mouth 2 (two) times daily between meals. 60 Can 0  . hydrALAZINE (APRESOLINE) 100 MG tablet TAKE ONE TABLET BY MOUTH THREE TIMES DAILY 90 tablet 3  . isosorbide mononitrate (IMDUR) 30 MG 24 hr tablet Take 1 tablet (30 mg total) by mouth daily. 30 tablet 0  . multivitamin (THERAGRAN) per tablet Take 1 tablet by mouth daily.      . nitroGLYCERIN (NITROSTAT) 0.4 MG SL tablet Place 1 tablet (0.4 mg total) under the tongue every 5 (five) minutes as needed for chest pain. 30 tablet 0   No current facility-administered medications for this visit.    Allergies:   Review of patient's allergies indicates no known allergies.    Social History:  The patient  reports that she has never smoked. She has never used smokeless tobacco. She reports that she does not drink alcohol or use illicit drugs.   Family History:  The patient's family history includes Cancer in her son; Coronary artery disease in an other family member; Heart attack in her son; Heart disease in her mother; Hyperlipidemia in her daughter; Hypertension in her daughter, daughter, daughter, and daughter.    ROS:  General:no colds or fevers, no weight changes Skin:no rashes or ulcers HEENT:no blurred vision, no congestion CV:see HPI PUL:see HPI GI:no diarrhea constipation or melena, no indigestion GU:no hematuria, no dysuria MS:no joint pain, no claudication Neuro:no syncope, no  lightheadedness Endo:no diabetes, no thyroid disease  Wt Readings from Last 3 Encounters:  08/06/15 110 lb 3.2 oz (49.986 kg)  07/21/15 112 lb 8 oz (51.03 kg)  07/13/15 101 lb 3.1 oz (45.9 kg)     PHYSICAL EXAM: VS:  BP 154/50 mmHg  Pulse 72  Ht  (1.549 m)  Wt 110 lb 3.2 oz (49.986 kg)  BMI 20.83 kg/m2 , BMI Body mass index is 20.83 kg/(m^2). General:Pleasant affect, NAD Skin:Warm and dry, brisk capillary refill HEENT:normocephalic, sclera clear, mucus membranes moist Neck:supple, no JVD, no bruits  Heart:S1S2 RRR without murmur, gallup, rub or click Lungs:clear without rales, rhonchi, or wheezes ZOX:WRUE, non tender, + BS, do not palpate liver spleen or masses Ext:no lower ext edema on rt, LT prosthesis, 2+ radial pulses Neuro:alert and oriented  X 3, MAE, follows commands, + facial symmetry    EKG:  EKG is ordered today. The ekg ordered today demonstrates SR abnormal but no changes from EKG in hospital.   Recent Labs: 12/18/2014: ALT 18 07/12/2015: TSH 1.599 07/13/2015: BUN 66*; Creatinine, Ser 3.18*; Hemoglobin 8.9*; Platelets 173; Potassium 3.6; Sodium 139    Lipid Panel    Component Value Date/Time   CHOL 117 07/13/2015 0412   TRIG 116 07/13/2015 0412   HDL 34* 07/13/2015 0412   CHOLHDL 3.4 07/13/2015 0412   VLDL 23 07/13/2015 0412   LDLCALC 60 07/13/2015 0412   LDLDIRECT 59.6 08/16/2012 1010       Other studies Reviewed: Additional studies/ records that were reviewed today include: hospital notes, echo.   ASSESSMENT AND PLAN:  1.  Angina, continues with angina, difficult situation with ckd-4 - discussed with Dr. Daiva Nakayama will increase Imdur today to 60 mg and see back in 1-2 weeks, if still with pain could add Ranexa.  Instructed to go to ER if pain significant.  2. CAD- treating medicaly s/p CABG  3.  HTN controlled    4. S/p AKA Lt    Current medicines are reviewed with the patient today.  The patient Has no concerns regarding  medicines.  The following changes have been made:  See above Labs/ tests ordered today include:see above  Disposition:   FU:  see above  Signed, Leone Brand, NP  08/06/2015 3:17 PM    [facility] [address] [place], [place], [place]  [address]/ [address] [place], [place] Phone: [phone]; Fax: [phone]  [phone]

## 2015-08-20 ENCOUNTER — Ambulatory Visit (INDEPENDENT_AMBULATORY_CARE_PROVIDER_SITE_OTHER): Payer: Medicare Other | Admitting: Physician Assistant

## 2015-08-20 ENCOUNTER — Encounter: Payer: Self-pay | Admitting: Physician Assistant

## 2015-08-20 VITALS — BP 140/32 | HR 64 | Ht 63.0 in | Wt 107.6 lb

## 2015-08-20 DIAGNOSIS — I208 Other forms of angina pectoris: Secondary | ICD-10-CM | POA: Diagnosis not present

## 2015-08-20 DIAGNOSIS — R0789 Other chest pain: Secondary | ICD-10-CM

## 2015-08-20 MED ORDER — CARVEDILOL 25 MG PO TABS: 25.0000 mg | ORAL_TABLET | Freq: Two times a day (BID) | ORAL | Status: AC

## 2015-08-20 MED ORDER — ISOSORBIDE MONONITRATE ER 30 MG PO TB24: 30.0000 mg | ORAL_TABLET | Freq: Every day | ORAL | Status: DC

## 2015-08-20 NOTE — Progress Notes (Signed)
Patient ID: LETRICIA Bowman, female   DOB: 06-04-1918, 81 y.o.   MRN: [id number]    Date:  08/20/2015   ID:  Adrienne Bowman, DOB 21-Jul-1918, MRN [id number]  PCP:  Ruthe Mannan, MD  Primary Cardiologist:  [facility]  Chief Complaint  Patient presents with  . Follow-up    2 week// IMDUR was increased from 30 to 60, pt states it is too much for her//no other Sx.     History of Present Illness: Adrienne Bowman is a 81 y.o. female who was seen for post hospital follow up after d/c 07/13/15, for chest pain-NSTEMI with pk troponin of 0.82 and SOB. Her HTN was not controlled on admit. She has a history of coronary artery disease status post CABG 2002, peripheral vascular disease severe with left above-knee amputation, difficult to control hypertension, diabetic nephropathy, chronic kidney disease stage IV, creatinine 3.1. Her last cardiac catheterization was 2002-prior to CABG, ejection fraction 45-50%, managed conservatively, no longer on ACE inhibitor or Aldactone given her progressive kidney disease. Plan in hospital was to continue medical management.   Patient is here for follow-up of her chest pain.  She says that she did not tolerate the increase in Imdur to 60 mg at all.  It made her dizzy.  Interestingly, the intensity and frequency of chest pain has diminished considerably and now she feels just a slight "pinprick" on the left side. She prefers to just take 30 mg of Imdur.  She otherwise denies nausea, vomiting, fever, shortness of breath, orthopnea, dizziness, PND, cough, congestion, abdominal pain, hematochezia, melena, lower extremity edema.  ECHO:Study Conclusions  - Left ventricle: The cavity size was normal. There was moderate concentric hypertrophy. Systolic function was normal. The estimated ejection fraction was in the range of 55% to 60%. Wall motion was normal; there were no regional wall motion abnormalities. Doppler parameters are consistent with abnormal left  ventricular relaxation (grade 1 diastolic dysfunction). Doppler parameters are consistent with high ventricular filling pressure. - Aortic valve: There was mild regurgitation. - Mitral valve: The findings are consistent with mild stenosis. There was mild regurgitation. - Left atrium: The atrium was moderately dilated. - Right atrium: The atrium was mildly dilated    Wt Readings from Last 3 Encounters:  08/20/15 48.807 kg (107 lb 9.6 oz)  08/06/15 49.986 kg (110 lb 3.2 oz)  07/21/15 51.03 kg (112 lb 8 oz)     Past Medical History  Diagnosis Date  . CHF (congestive heart failure) 2002    acute CHF while getting lower extremilty arteriograms /  Led to CABG  x6  . Hypertension     very difficult to control  . PVD (peripheral vascular disease)     extensive PVD / left AKA  . Murmur, heart     soft systolic outflow murmur and a grade 2/6 murmur of the aortic insufficiency  . S/P AKA (above knee amputation)     left AKA with prosthesis  . DIABETES MELLITUS, TYPE II   . NEPHROPATHY, DIABETIC   . Coronary artery disease 2002    CABG x6 / left internal mammary artery graft to the LAD, a saphenous vein graft to the diagonal, a sequential saphenous vein graft to the 1st and 2nd obtuse marginal branches, and a sequential saphenous vein graft the the distal right coronary artery and posterior descending  . Anemia   . CKD (chronic kidney disease)   . Advanced age     Current Outpatient Prescriptions  Medication Sig Dispense  Refill  . amlodipine-atorvastatin (CADUET) 10-10 MG per tablet TAKE ONE TABLET BY MOUTH EVERY DAY 90 tablet 3  . aspirin 81 MG tablet Take 81 mg by mouth daily.      . carvedilol (COREG) 25 MG tablet Take 1 tablet (25 mg total) by mouth 2 (two) times daily with a meal. 60 tablet 0  . cloNIDine (CATAPRES) 0.2 MG tablet Take 1 tablet (0.2 mg total) by mouth 2 (two) times daily. 60 tablet 0  . famotidine (PEPCID) 20 MG tablet Take 1 tablet (20 mg total) by mouth  daily. 30 tablet 0  . furosemide (LASIX) 40 MG tablet Take 1 tablet (40 mg total) by mouth daily. 30 tablet 3  . GLUCERNA (GLUCERNA) LIQD Take 237 mLs by mouth 2 (two) times daily between meals. 60 Can 0  . hydrALAZINE (APRESOLINE) 100 MG tablet TAKE ONE TABLET BY MOUTH THREE TIMES DAILY 90 tablet 3  . isosorbide mononitrate (IMDUR) 60 MG 24 hr tablet Take 1 tablet (60 mg total) by mouth daily. 90 tablet 3  . multivitamin (THERAGRAN) per tablet Take 1 tablet by mouth daily.      . nitroGLYCERIN (NITROSTAT) 0.4 MG SL tablet Place 1 tablet (0.4 mg total) under the tongue every 5 (five) minutes as needed for chest pain. 30 tablet 0   No current facility-administered medications for this visit.    Allergies:   No Known Allergies  Social History:  The patient  reports that she has never smoked. She has never used smokeless tobacco. She reports that she does not drink alcohol or use illicit drugs.   Family history:   Family History  Problem Relation Age of Onset  . Coronary artery disease      prevalent in sibling  . Heart disease Mother   . Hypertension Daughter   . Cancer Son     Lung  and  Throat  . Heart attack Son   . Hyperlipidemia Daughter   . Hypertension Daughter   . Hypertension Daughter   . Hypertension Daughter     ROS:  Please see the history of present illness.  All other systems reviewed and negative.   PHYSICAL EXAM: VS:  BP 140/32 mmHg  Pulse 64  Ht  (1.6 m)  Wt 48.807 kg (107 lb 9.6 oz)  BMI 19.07 kg/m2 Thin, well developed, in no acute distress HEENT: Pupils are equal round react to light accommodation extraocular movements are intact.  Neck: no JVDNo cervical lymphadenopathy. Cardiac: Regular rate and rhythm without murmurs rubs or gallops. Lungs:  clear to auscultation bilaterally, no wheezing, rhonchi or rales Ext: no right lower extremity edema.  2+ radial and right dorsalis pedis pulses. Skin: warm and dry Neuro:  Grossly normal    ASSESSMENT  AND PLAN:  1. Angina Frequency and intensity of her angina has decreased considerably despite not increasing Imdur to 60 mg. She describes just minor discomfort with last just a few minutes. She did not tolerate 60 mg of Imdur because of dizziness but did continue to take the 30.  At this point I do not think we need to add renexa.  2. CAD- treating medicaly s/p CABG  3. HTN controlled   4. S/p AKA Lt

## 2015-08-20 NOTE — Patient Instructions (Signed)
Your physician has recommended you make the following change in your medication: DECREASE ISOSORBIDE TO 30 MG DAILY.  Your physician recommends that you schedule a follow-up appointment in: 3 MONTHS WITH DR. HOCHREIN.

## 2015-09-12 ENCOUNTER — Other Ambulatory Visit: Payer: Self-pay | Admitting: Family Medicine

## 2015-10-22 ENCOUNTER — Ambulatory Visit (INDEPENDENT_AMBULATORY_CARE_PROVIDER_SITE_OTHER): Payer: Medicare Other | Admitting: Ophthalmology

## 2015-10-30 ENCOUNTER — Other Ambulatory Visit: Payer: Self-pay | Admitting: Family Medicine

## 2015-11-12 ENCOUNTER — Encounter: Payer: Self-pay | Admitting: *Deleted

## 2015-11-20 ENCOUNTER — Encounter: Payer: Self-pay | Admitting: Family

## 2015-11-21 ENCOUNTER — Other Ambulatory Visit: Payer: Self-pay | Admitting: *Deleted

## 2015-11-21 DIAGNOSIS — Z95828 Presence of other vascular implants and grafts: Secondary | ICD-10-CM

## 2015-11-21 DIAGNOSIS — I739 Peripheral vascular disease, unspecified: Secondary | ICD-10-CM

## 2015-11-23 ENCOUNTER — Other Ambulatory Visit: Payer: Self-pay | Admitting: Cardiovascular Disease

## 2015-11-23 NOTE — Telephone Encounter (Signed)
Rx(s) sent to pharmacy electronically.  

## 2015-11-23 NOTE — Telephone Encounter (Signed)
Dr. Hochrein patient 

## 2015-11-24 ENCOUNTER — Ambulatory Visit: Payer: Medicare Other | Admitting: Family

## 2015-11-24 ENCOUNTER — Encounter (HOSPITAL_COMMUNITY): Payer: Medicare Other

## 2015-11-24 ENCOUNTER — Other Ambulatory Visit: Payer: Self-pay | Admitting: Vascular Surgery

## 2015-11-24 ENCOUNTER — Other Ambulatory Visit (HOSPITAL_COMMUNITY): Payer: Medicare Other

## 2015-11-24 DIAGNOSIS — Z95828 Presence of other vascular implants and grafts: Secondary | ICD-10-CM

## 2015-11-24 DIAGNOSIS — I739 Peripheral vascular disease, unspecified: Secondary | ICD-10-CM

## 2015-11-26 ENCOUNTER — Encounter: Payer: Self-pay | Admitting: Cardiology

## 2015-11-26 ENCOUNTER — Ambulatory Visit (INDEPENDENT_AMBULATORY_CARE_PROVIDER_SITE_OTHER): Payer: Medicare Other | Admitting: Cardiology

## 2015-11-26 VITALS — BP 148/48 | HR 69 | Ht 65.0 in | Wt 102.1 lb

## 2015-11-26 DIAGNOSIS — I10 Essential (primary) hypertension: Secondary | ICD-10-CM | POA: Diagnosis not present

## 2015-11-26 NOTE — Patient Instructions (Signed)
Medication Instructions:  Your physician recommends that you continue on your current medications as directed. Please refer to the Current Medication list given to you today.   Labwork: none  Testing/Procedures: none  Follow-Up: Your physician wants you to follow-up in: 6 months with Dr. Hochrein. You will receive a reminder letter in the mail two months in advance. If you don't receive a letter, please call our office to schedule the follow-up appointment.   Any Other Special Instructions Will Be Listed Below (If Applicable).     If you need a refill on your cardiac medications before your next appointment, please call your pharmacy.   

## 2015-11-26 NOTE — Progress Notes (Signed)
Cardiology Office Note   Date:  11/26/2015   ID:  Adrienne Bowman, DOB 1918/03/12, MRN [id number]  PCP:  Ruthe Mannan, MD  Cardiologist:   Rollene Rotunda, MD   Chief Complaint  Patient presents with  . Coronary Artery Disease      History of Present Illness: Adrienne Bowman is a 82 y.o. female who presents for the patient presents for follow-up of coronary artery disease status post CABG. Reisinger somewhat frequently recently because she was having some chest pain. We actually tried going up on her Imdur a she seems to have had a paradoxical reaction increasing chest pain she's not taking this medicine and she says she feels well. She gets around in her wheelchair and walks on her prosthesis. She can do some household chores including vacuuming without bringing on any of the symptoms. She doesn't have any chest pressure by her report that she denies any shortness of breath, PND or orthopnea. She said no palpitations, presyncope or syncope.  Past Medical History  Diagnosis Date  . CHF (congestive heart failure) (HCC) 2002    acute CHF while getting lower extremilty arteriograms /  Led to CABG  x6  . Hypertension     very difficult to control  . PVD (peripheral vascular disease) (HCC)     extensive PVD / left AKA  . Murmur, heart     soft systolic outflow murmur and a grade 2/6 murmur of the aortic insufficiency  . S/P AKA (above knee amputation) (HCC)     left AKA with prosthesis  . DIABETES MELLITUS, TYPE II   . NEPHROPATHY, DIABETIC   . Coronary artery disease 2002    CABG x6 / left internal mammary artery graft to the LAD, a saphenous vein graft to the diagonal, a sequential saphenous vein graft to the 1st and 2nd obtuse marginal branches, and a sequential saphenous vein graft the the distal right coronary artery and posterior descending  . Anemia   . CKD (chronic kidney disease)   . Advanced age     Past Surgical History  Procedure Laterality Date  . Coronary artery  bypass graft  2002    x6 per Dr. Laneta Simmers  . Eye surgery  1987 & 1988     left eye  . Vascular surgery      multiple peripheral vascular surgeries  . Left aka  2006  . Right renal artery stent  2004  . Rfpbpg  2002  . Lower extremity angiogram  February 21, 2012  . Pr vein bypass graft,aorto-fem-pop  Oct. 17, 2012    Right  Fem-Distal post. Tibial artery BPG.  . Lower extremity angiogram N/A 02/21/2012    Procedure: LOWER EXTREMITY ANGIOGRAM;  Surgeon: Nada Libman, MD;  [facility] CATH LAB;  Service: Cardiovascular;  Laterality: N/A;  . Lower extremity angiogram Right 12/25/2012    Procedure: LOWER EXTREMITY ANGIOGRAM;  Surgeon: Nada Libman, MD;  Location: [facility] CATH LAB;  Service: Cardiovascular;  Laterality: Right;  rt leg angio  . Abdominal angiogram  12/25/2012    Procedure: ABDOMINAL ANGIOGRAM;  Surgeon: Nada Libman, MD;  Location: [facility] CATH LAB;  Service: Cardiovascular;;     Current Outpatient Prescriptions  Medication Sig Dispense Refill  . amlodipine-atorvastatin (CADUET) 10-10 MG tablet TAKE ONE TABLET BY MOUTH ONCE DAILY 90 tablet 2  . aspirin 81 MG tablet Take 81 mg by mouth daily.      . carvedilol (COREG) 25 MG tablet Take  1 tablet (25 mg total) by mouth 2 (two) times daily with a meal. 180 tablet 3  . cloNIDine (CATAPRES) 0.2 MG tablet TAKE ONE TABLET BY MOUTH TWICE DAILY 60 tablet 3  . famotidine (PEPCID) 20 MG tablet TAKE ONE TABLET BY MOUTH ONCE DAILY 30 tablet 0  . furosemide (LASIX) 40 MG tablet Take 1 tablet (40 mg total) by mouth daily. 30 tablet 3  . GLUCERNA (GLUCERNA) LIQD Take 237 mLs by mouth 2 (two) times daily between meals. 60 Can 0  . hydrALAZINE (APRESOLINE) 100 MG tablet TAKE ONE TABLET BY MOUTH THREE TIMES DAILY 90 tablet 3  . multivitamin (THERAGRAN) per tablet Take 1 tablet by mouth daily.      . nitroGLYCERIN (NITROSTAT) 0.4 MG SL tablet Place 1 tablet (0.4 mg total) under the tongue every 5 (five) minutes as needed for chest pain. 30 tablet 0    No current facility-administered medications for this visit.    Allergies:   Review of patient's allergies indicates no known allergies.    ROS:  Please see the history of present illness.   Otherwise, review of systems are positive for none.   All other systems are reviewed and negative.    PHYSICAL EXAM: VS:  BP 148/48 mmHg  Pulse 69  Ht 5\' 5"  (1.651 m)  Wt 102 lb 2 oz (46.324 kg)  BMI 16.99 kg/m2 , BMI Body mass index is 16.99 kg/(m^2). GENERAL:  Well appearing, thin NECK:  No jugular venous distention, waveform within normal limits, carotid upstroke brisk and symmetric, bilateral bruits, no thyromegaly LUNGS:  Clear to auscultation bilaterally BACK:  No CVA tenderness CHEST:  Unremarkable HEART:  PMI not displaced or sustained,S1 and S2 within normal limits, no S3, no S4, no clicks, no rubs, soft apical systolic murmurs ABD:  Flat, positive bowel sounds normal in frequency in pitch, positive bruit, no rebound, no guarding, no midline pulsatile mass, no hepatomegaly, no splenomegaly EXT:  2 plus pulses throughout, no edema, no cyanosis no clubbing, left leg amputation, right great and second toe amputation, femoral bruits   EKG:  EKG is ordered today. The ekg ordered today demonstrates sinus rhythm, rate 69, axis within normal limits, poor anterior R wave progression, left atrial enlargement, inferolateral T wave inversions consistent with ischemia but unchanged from previous.  11/26/2015    Recent Labs: 12/18/2014: ALT 18 07/12/2015: TSH 1.599 07/13/2015: BUN 66*; Creatinine, Ser 3.18*; Hemoglobin 8.9*; Platelets 173; Potassium 3.6; Sodium 139    Lipid Panel    Component Value Date/Time   CHOL 117 07/13/2015 0412   TRIG 116 07/13/2015 0412   HDL 34* 07/13/2015 0412   CHOLHDL 3.4 07/13/2015 0412   VLDL 23 07/13/2015 0412   LDLCALC 60 07/13/2015 0412   LDLDIRECT 59.6 08/16/2012 1010     Lab Results  Component Value Date   HGBA1C 6.5* 07/12/2015     Wt Readings  from Last 3 Encounters:  11/26/15 102 lb 2 oz (46.324 kg)  08/20/15 107 lb 9.6 oz (48.807 kg)  08/06/15 110 lb 3.2 oz (49.986 kg)      Other studies Reviewed: Additional studies/ records that were reviewed today include: None.    ASSESSMENT AND PLAN:  Essential hypertension The blood pressure is at target. No change in medications is indicated. We will continue with therapeutic lifestyle changes (TLC).   Chronic kidney disease Most recent creatinine 3.1.   This can be followed by her primary provider.  Her most recent prior to this was 3.4.  Baseline 2.8 earlier last year. No change in therapy is following.   Occlusion and stenosis of carotid artery without mention of cerebral infarction She is followed by [facility].   CAD (coronary artery disease) The patient has no new sypmtoms.  No further cardiovascular testing is indicated.  We will continue with aggressive risk reduction and meds as listed.  Congestive heart failure She seems to be euvolemic.  At this point, no change in therapy is indicated.  We have reviewed salt and fluid restrictions.  No further cardiovascular testing is indicated.   Current medicines are reviewed at length with the patient today.  The patient does not have concerns regarding medicines.  The following changes have been made:  no change  Labs/ tests ordered today include: None     Disposition:   FU with me in six months.     Signed, Rollene Rotunda, MD  11/26/2015 2:00 PM    [facility]

## 2015-12-06 ENCOUNTER — Other Ambulatory Visit: Payer: Self-pay | Admitting: Cardiology

## 2015-12-06 ENCOUNTER — Other Ambulatory Visit: Payer: Self-pay | Admitting: Family Medicine

## 2015-12-07 NOTE — Telephone Encounter (Signed)
Rx request sent to pharmacy.  

## 2015-12-07 NOTE — Telephone Encounter (Signed)
Last f/u 07/2015 

## 2016-01-16 ENCOUNTER — Encounter (HOSPITAL_COMMUNITY): Payer: Self-pay | Admitting: Family Medicine

## 2016-01-16 ENCOUNTER — Emergency Department (HOSPITAL_COMMUNITY): Payer: Medicare Other

## 2016-01-16 ENCOUNTER — Inpatient Hospital Stay (HOSPITAL_COMMUNITY)
Admission: EM | Admit: 2016-01-16 | Discharge: 2016-01-23 | DRG: 291 | Disposition: A | Discharge status: Skilled Nursing Facility | Payer: Medicare Other | Attending: Family Medicine | Admitting: Family Medicine

## 2016-01-16 DIAGNOSIS — Z79899 Other long term (current) drug therapy: Secondary | ICD-10-CM

## 2016-01-16 DIAGNOSIS — Z7982 Long term (current) use of aspirin: Secondary | ICD-10-CM

## 2016-01-16 DIAGNOSIS — E1122 Type 2 diabetes mellitus with diabetic chronic kidney disease: Secondary | ICD-10-CM | POA: Diagnosis not present

## 2016-01-16 DIAGNOSIS — N179 Acute kidney failure, unspecified: Secondary | ICD-10-CM | POA: Diagnosis not present

## 2016-01-16 DIAGNOSIS — R0602 Shortness of breath: Secondary | ICD-10-CM | POA: Diagnosis not present

## 2016-01-16 DIAGNOSIS — N184 Chronic kidney disease, stage 4 (severe): Secondary | ICD-10-CM

## 2016-01-16 DIAGNOSIS — I130 Hypertensive heart and chronic kidney disease with heart failure and stage 1 through stage 4 chronic kidney disease, or unspecified chronic kidney disease: Secondary | ICD-10-CM | POA: Diagnosis not present

## 2016-01-16 DIAGNOSIS — N183 Chronic kidney disease, stage 3 (moderate): Secondary | ICD-10-CM | POA: Diagnosis present

## 2016-01-16 DIAGNOSIS — I5033 Acute on chronic diastolic (congestive) heart failure: Secondary | ICD-10-CM | POA: Diagnosis present

## 2016-01-16 DIAGNOSIS — R0600 Dyspnea, unspecified: Secondary | ICD-10-CM

## 2016-01-16 DIAGNOSIS — I160 Hypertensive urgency: Secondary | ICD-10-CM | POA: Diagnosis present

## 2016-01-16 DIAGNOSIS — I739 Peripheral vascular disease, unspecified: Secondary | ICD-10-CM

## 2016-01-16 DIAGNOSIS — R0902 Hypoxemia: Secondary | ICD-10-CM | POA: Diagnosis present

## 2016-01-16 DIAGNOSIS — Z515 Encounter for palliative care: Secondary | ICD-10-CM | POA: Diagnosis present

## 2016-01-16 DIAGNOSIS — E1121 Type 2 diabetes mellitus with diabetic nephropathy: Secondary | ICD-10-CM | POA: Diagnosis present

## 2016-01-16 DIAGNOSIS — E1151 Type 2 diabetes mellitus with diabetic peripheral angiopathy without gangrene: Secondary | ICD-10-CM | POA: Diagnosis present

## 2016-01-16 DIAGNOSIS — E785 Hyperlipidemia, unspecified: Secondary | ICD-10-CM | POA: Diagnosis present

## 2016-01-16 DIAGNOSIS — Z681 Body mass index (BMI) 19 or less, adult: Secondary | ICD-10-CM

## 2016-01-16 DIAGNOSIS — I509 Heart failure, unspecified: Secondary | ICD-10-CM | POA: Diagnosis not present

## 2016-01-16 DIAGNOSIS — I2510 Atherosclerotic heart disease of native coronary artery without angina pectoris: Secondary | ICD-10-CM | POA: Diagnosis present

## 2016-01-16 DIAGNOSIS — I25110 Atherosclerotic heart disease of native coronary artery with unstable angina pectoris: Secondary | ICD-10-CM

## 2016-01-16 DIAGNOSIS — I4510 Unspecified right bundle-branch block: Secondary | ICD-10-CM | POA: Diagnosis present

## 2016-01-16 DIAGNOSIS — D638 Anemia in other chronic diseases classified elsewhere: Secondary | ICD-10-CM | POA: Diagnosis present

## 2016-01-16 DIAGNOSIS — I5043 Acute on chronic combined systolic (congestive) and diastolic (congestive) heart failure: Secondary | ICD-10-CM | POA: Diagnosis present

## 2016-01-16 DIAGNOSIS — I25119 Atherosclerotic heart disease of native coronary artery with unspecified angina pectoris: Secondary | ICD-10-CM | POA: Diagnosis present

## 2016-01-16 DIAGNOSIS — N189 Chronic kidney disease, unspecified: Secondary | ICD-10-CM

## 2016-01-16 DIAGNOSIS — Z8249 Family history of ischemic heart disease and other diseases of the circulatory system: Secondary | ICD-10-CM

## 2016-01-16 DIAGNOSIS — Z89612 Acquired absence of left leg above knee: Secondary | ICD-10-CM

## 2016-01-16 DIAGNOSIS — I472 Ventricular tachycardia: Secondary | ICD-10-CM | POA: Diagnosis present

## 2016-01-16 DIAGNOSIS — Z951 Presence of aortocoronary bypass graft: Secondary | ICD-10-CM

## 2016-01-16 DIAGNOSIS — Z66 Do not resuscitate: Secondary | ICD-10-CM | POA: Diagnosis present

## 2016-01-16 DIAGNOSIS — Z89619 Acquired absence of unspecified leg above knee: Secondary | ICD-10-CM

## 2016-01-16 DIAGNOSIS — E43 Unspecified severe protein-calorie malnutrition: Secondary | ICD-10-CM | POA: Diagnosis present

## 2016-01-16 DIAGNOSIS — E876 Hypokalemia: Secondary | ICD-10-CM | POA: Diagnosis present

## 2016-01-16 DIAGNOSIS — I351 Nonrheumatic aortic (valve) insufficiency: Secondary | ICD-10-CM | POA: Diagnosis present

## 2016-01-16 LAB — BASIC METABOLIC PANEL
ANION GAP: 13 (ref 5–15)
BUN: 95 mg/dL — ABNORMAL HIGH (ref 6–20)
CALCIUM: 9 mg/dL (ref 8.9–10.3)
CO2: 22 mmol/L (ref 22–32)
CREATININE: 4.01 mg/dL — AB (ref 0.44–1.00)
Chloride: 106 mmol/L (ref 101–111)
GFR calc non Af Amer: 9 mL/min — ABNORMAL LOW (ref >60)
GFR, EST AFRICAN AMERICAN: 10 mL/min — AB (ref >60)
Glucose, Bld: 259 mg/dL — ABNORMAL HIGH (ref 65–99)
Potassium: 4.3 mmol/L (ref 3.5–5.1)
SODIUM: 141 mmol/L (ref 135–145)

## 2016-01-16 LAB — CBC
HCT: 28.1 % — ABNORMAL LOW (ref 36.0–46.0)
HEMOGLOBIN: 8.8 g/dL — AB (ref 12.0–15.0)
MCH: 27.1 pg (ref 26.0–34.0)
MCHC: 31.3 g/dL (ref 30.0–36.0)
MCV: 86.5 fL (ref 78.0–100.0)
PLATELETS: 266 10*3/uL (ref 150–400)
RBC: 3.25 MIL/uL — AB (ref 3.87–5.11)
RDW: 12.9 % (ref 11.5–15.5)
WBC: 8.9 10*3/uL (ref 4.0–10.5)

## 2016-01-16 LAB — I-STAT TROPONIN, ED: TROPONIN I, POC: 0.09 ng/mL — AB (ref 0.00–0.08)

## 2016-01-16 LAB — BRAIN NATRIURETIC PEPTIDE: B NATRIURETIC PEPTIDE 5: 3887.7 pg/mL — AB (ref 0.0–100.0)

## 2016-01-16 LAB — GLUCOSE, CAPILLARY: GLUCOSE-CAPILLARY: 200 mg/dL — AB (ref 65–99)

## 2016-01-16 MED ORDER — CHLORHEXIDINE GLUCONATE CLOTH 2 % EX PADS
6.0000 | MEDICATED_PAD | Freq: Once | CUTANEOUS | Status: AC
  Administered 2016-01-16: 6 via TOPICAL

## 2016-01-16 MED ORDER — ASPIRIN EC 81 MG PO TBEC
81.0000 mg | DELAYED_RELEASE_TABLET | Freq: Every day | ORAL | Status: DC
  Administered 2016-01-17 – 2016-01-23 (×7): 81 mg via ORAL
  Filled 2016-01-16 (×7): qty 1

## 2016-01-16 MED ORDER — INSULIN ASPART 100 UNIT/ML ~~LOC~~ SOLN
0.0000 [IU] | Freq: Three times a day (TID) | SUBCUTANEOUS | Status: DC
  Administered 2016-01-17: 2 [IU] via SUBCUTANEOUS

## 2016-01-16 MED ORDER — FAMOTIDINE 20 MG PO TABS
20.0000 mg | ORAL_TABLET | Freq: Every day | ORAL | Status: DC
  Administered 2016-01-17 – 2016-01-23 (×7): 20 mg via ORAL
  Filled 2016-01-16 (×7): qty 1

## 2016-01-16 MED ORDER — CARVEDILOL 25 MG PO TABS
25.0000 mg | ORAL_TABLET | Freq: Two times a day (BID) | ORAL | Status: DC
  Administered 2016-01-17 – 2016-01-23 (×13): 25 mg via ORAL
  Filled 2016-01-16 (×13): qty 1

## 2016-01-16 MED ORDER — ACETAMINOPHEN 325 MG PO TABS
650.0000 mg | ORAL_TABLET | ORAL | Status: DC | PRN
  Administered 2016-01-17: 650 mg via ORAL

## 2016-01-16 MED ORDER — ONDANSETRON HCL 4 MG/2ML IJ SOLN: 4.0000 mg | Freq: Four times a day (QID) | INTRAMUSCULAR | Status: DC | PRN

## 2016-01-16 MED ORDER — LEVALBUTEROL HCL 0.63 MG/3ML IN NEBU
0.6300 mg | INHALATION_SOLUTION | RESPIRATORY_TRACT | Status: DC | PRN
  Administered 2016-01-16 – 2016-01-17 (×2): 0.63 mg via RESPIRATORY_TRACT
  Filled 2016-01-16: qty 3

## 2016-01-16 MED ORDER — FUROSEMIDE 10 MG/ML IJ SOLN
60.0000 mg | Freq: Two times a day (BID) | INTRAMUSCULAR | Status: DC
  Administered 2016-01-17: 60 mg via INTRAVENOUS
  Filled 2016-01-16: qty 6

## 2016-01-16 MED ORDER — IPRATROPIUM-ALBUTEROL 0.5-2.5 (3) MG/3ML IN SOLN
3.0000 mL | Freq: Once | RESPIRATORY_TRACT | Status: AC
  Administered 2016-01-16: 3 mL via RESPIRATORY_TRACT
  Filled 2016-01-16: qty 3

## 2016-01-16 MED ORDER — LEVALBUTEROL HCL 0.63 MG/3ML IN NEBU
INHALATION_SOLUTION | RESPIRATORY_TRACT | Status: AC
  Filled 2016-01-16: qty 3

## 2016-01-16 MED ORDER — NITROGLYCERIN 2 % TD OINT
1.0000 [in_us] | TOPICAL_OINTMENT | Freq: Once | TRANSDERMAL | Status: AC
  Administered 2016-01-16: 1 [in_us] via TOPICAL
  Filled 2016-01-16: qty 1

## 2016-01-16 MED ORDER — CLONIDINE HCL 0.2 MG PO TABS
0.2000 mg | ORAL_TABLET | Freq: Two times a day (BID) | ORAL | Status: DC
  Administered 2016-01-17 (×3): 0.2 mg via ORAL
  Filled 2016-01-16 (×3): qty 1

## 2016-01-16 MED ORDER — ACETAMINOPHEN 325 MG PO TABS
650.0000 mg | ORAL_TABLET | ORAL | Status: DC | PRN
  Filled 2016-01-16: qty 2

## 2016-01-16 MED ORDER — ALPRAZOLAM 0.25 MG PO TABS
0.2500 mg | ORAL_TABLET | Freq: Two times a day (BID) | ORAL | Status: DC | PRN
Start: 2016-01-16 — End: 2016-01-23
  Administered 2016-01-17: 0.25 mg via ORAL
  Filled 2016-01-16: qty 1

## 2016-01-16 MED ORDER — SODIUM CHLORIDE 0.9% FLUSH: 3.0000 mL | INTRAVENOUS | Status: DC | PRN

## 2016-01-16 MED ORDER — HYDRALAZINE HCL 50 MG PO TABS
100.0000 mg | ORAL_TABLET | Freq: Three times a day (TID) | ORAL | Status: DC
  Administered 2016-01-17 – 2016-01-23 (×21): 100 mg via ORAL
  Filled 2016-01-16 (×22): qty 2

## 2016-01-16 MED ORDER — FUROSEMIDE 10 MG/ML IJ SOLN
40.0000 mg | Freq: Once | INTRAMUSCULAR | Status: AC
  Administered 2016-01-16: 40 mg via INTRAVENOUS
  Filled 2016-01-16: qty 4

## 2016-01-16 MED ORDER — SODIUM CHLORIDE 0.9 % IV SOLN: 250.0000 mL | INTRAVENOUS | Status: DC | PRN

## 2016-01-16 MED ORDER — INSULIN ASPART 100 UNIT/ML ~~LOC~~ SOLN: 0.0000 [IU] | Freq: Every day | SUBCUTANEOUS | Status: DC

## 2016-01-16 MED ORDER — MORPHINE SULFATE (PF) 2 MG/ML IV SOLN
2.0000 mg | INTRAVENOUS | Status: DC | PRN
  Filled 2016-01-16: qty 1

## 2016-01-16 MED ORDER — SODIUM CHLORIDE 0.9% FLUSH
3.0000 mL | Freq: Two times a day (BID) | INTRAVENOUS | Status: DC
  Administered 2016-01-17 – 2016-01-22 (×8): 3 mL via INTRAVENOUS

## 2016-01-16 NOTE — ED Notes (Signed)
Pt here for chest pain and SOB that started this am. Pt sts left sided

## 2016-01-16 NOTE — ED Provider Notes (Addendum)
CSN: [id number]     Arrival date & time 01/16/16  1847 History   First MD Initiated Contact with Patient 01/16/16 1900     Chief Complaint  Patient presents with  . Shortness of Breath  . Chest Pain     (Consider location/radiation/quality/duration/timing/severity/associated sxs/prior Treatment) HPI Comments: Patient is a 82 year old female with past medical history of hypertension, coronary artery disease with bypass surgery, peripheral vascular disease, and right renal artery stent. She presents today with difficulty breathing that began this morning shortly after waking from sleep. She denies any fevers or chills. She denies any cough. She does report some tightness across her chest.   Patient is a 82 y.o. female presenting with shortness of breath and chest pain. The history is provided by the patient.  Shortness of Breath Severity:  Moderate Onset quality:  Sudden Duration:  12 hours Timing:  Constant Progression:  Worsening Chronicity:  New Context: activity   Relieved by:  Nothing Worsened by:  Nothing tried Ineffective treatments:  None tried Associated symptoms: chest pain   Chest Pain Associated symptoms: shortness of breath     Past Medical History  Diagnosis Date  . CHF (congestive heart failure) (HCC) 2002    acute CHF while getting lower extremilty arteriograms /  Led to CABG  x6  . Hypertension     very difficult to control  . PVD (peripheral vascular disease) (HCC)     extensive PVD / left AKA  . Murmur, heart     soft systolic outflow murmur and a grade 2/6 murmur of the aortic insufficiency  . S/P AKA (above knee amputation) (HCC)     left AKA with prosthesis  . DIABETES MELLITUS, TYPE II   . NEPHROPATHY, DIABETIC   . Coronary artery disease 2002    CABG x6 / left internal mammary artery graft to the LAD, a saphenous vein graft to the diagonal, a sequential saphenous vein graft to the 1st and 2nd obtuse marginal branches, and a sequential saphenous vein  graft the the distal right coronary artery and posterior descending  . Anemia   . CKD (chronic kidney disease)   . Advanced age    Past Surgical History  Procedure Laterality Date  . Coronary artery bypass graft  2002    x6 per Dr. Laneta SimmersBartle  . Eye surgery  1987 & 1988     left eye  . Vascular surgery      multiple peripheral vascular surgeries  . Left aka  2006  . Right renal artery stent  2004  . Rfpbpg  2002  . Lower extremity angiogram  February 21, 2012  . Pr vein bypass graft,aorto-fem-pop  Oct. 17, 2012    Right  Fem-Distal post. Tibial artery BPG.  . Lower extremity angiogram N/A 02/21/2012    Procedure: LOWER EXTREMITY ANGIOGRAM;  Surgeon: Nada LibmanVance W Brabham, MD;  [facility];  Service: Cardiovascular;  Laterality: N/A;  . Lower extremity angiogram Right 12/25/2012    Procedure: LOWER EXTREMITY ANGIOGRAM;  Surgeon: Nada LibmanVance W Brabham, MD;  [facility];  Service: Cardiovascular;  Laterality: Right;  rt leg angio  . Abdominal angiogram  12/25/2012    Procedure: ABDOMINAL ANGIOGRAM;  Surgeon: Nada LibmanVance W Brabham, MD;  [facility];  Service: Cardiovascular;;   Family History  Problem Relation Age of Onset  . Coronary artery disease      prevalent in sibling  . Heart disease Mother   . Hypertension Daughter   . Cancer  Son     Lung  and  Throat  . Heart attack Son   . Hyperlipidemia Daughter   . Hypertension Daughter   . Hypertension Daughter   . Hypertension Daughter    Social History  Substance Use Topics  . Smoking status: Never Smoker   . Smokeless tobacco: Never Used  . Alcohol Use: No   OB History    No data available     Review of Systems  Respiratory: Positive for shortness of breath.   Cardiovascular: Positive for chest pain.  All other systems reviewed and are negative.     Allergies  Review of patient's allergies indicates no known allergies.  Home Medications   Prior to Admission medications   Medication Sig Start Date End Date  Taking? Authorizing Provider  amlodipine-atorvastatin (CADUET) 10-10 MG tablet TAKE ONE TABLET BY MOUTH ONCE DAILY 11/23/15   Rollene Rotunda, MD  aspirin 81 MG tablet Take 81 mg by mouth daily.      Historical Provider, MD  carvedilol (COREG) 25 MG tablet Take 1 tablet (25 mg total) by mouth 2 (two) times daily with a meal. 08/20/15   Dwana Melena, PA-C  cloNIDine (CATAPRES) 0.2 MG tablet TAKE ONE TABLET BY MOUTH TWICE DAILY 09/14/15   Dianne Dun, MD  famotidine (PEPCID) 20 MG tablet TAKE ONE TABLET BY MOUTH ONCE DAILY 12/07/15   Dianne Dun, MD  furosemide (LASIX) 40 MG tablet TAKE ONE TABLET BY MOUTH ONCE DAILY 12/07/15   Dianne Dun, MD  GLUCERNA (GLUCERNA) LIQD Take 237 mLs by mouth 2 (two) times daily between meals. 07/13/15   Renae Fickle, MD  hydrALAZINE (APRESOLINE) 100 MG tablet TAKE ONE TABLET BY MOUTH THREE TIMES DAILY 12/07/15   Rollene Rotunda, MD  multivitamin [facility]) per tablet Take 1 tablet by mouth daily.      Historical Provider, MD  nitroGLYCERIN (NITROSTAT) 0.4 MG SL tablet Place 1 tablet (0.4 mg total) under the tongue every 5 (five) minutes as needed for chest pain. 07/13/15   Renae Fickle, MD   BP 169/69 mmHg  Pulse 102  Resp 32  SpO2 99% Physical Exam  Constitutional: She is oriented to person, place, and time.  Patient is an elderly female in moderate respiratory distress.  HENT:  Head: Normocephalic and atraumatic.  Neck: Normal range of motion. Neck supple.  Cardiovascular: Normal rate, regular rhythm and normal heart sounds.   No murmur heard. Pulmonary/Chest: She is in respiratory distress. She has rales.  She is in moderate respiratory distress with rales in the bases bilaterally.  Abdominal: Soft. Bowel sounds are normal.  Musculoskeletal: Normal range of motion.  Neurological: She is alert and oriented to person, place, and time.  Skin: Skin is warm and dry.  Nursing note and vitals reviewed.   ED Course  Procedures (including critical care  time) Labs Review Labs Reviewed  BASIC METABOLIC PANEL  CBC  BRAIN NATRIURETIC PEPTIDE  I-STAT TROPOININ, ED    Imaging Review No results found. I have personally reviewed and evaluated these images and lab results as part of my medical decision-making.   EKG Interpretation   Date/Time:  Saturday January 16 2016 18:54:59 EST Ventricular Rate:  102 PR Interval:  208 QRS Duration: 102 QT Interval:  362 QTC Calculation: 471 R Axis:   33 Text Interpretation:  Sinus tachycardia Possible Left atrial enlargement  Incomplete right bundle branch block Anteroseptal infarct , age  undetermined Marked ST abnormality, possible inferior subendocardial  injury Abnormal  ECG T wave abnormality more pronounced when compared with  07/13/15 Confirmed by Tomeika Weinmann  MD, Geordie Nooney ([id number]) on 01/16/2016 9:04:35 PM      MDM   Final diagnoses:  None    Patient presents with complaints of difficulty breathing. This started this morning and has rapidly worsened. She arrived in moderate respiratory distress. Chest x-ray reveals CHF/pulmonary edema and her BNP is significantly elevated. She is given IV Lasix, nitroglycerin paste, a nebulizer treatment, and has improved somewhat. She will be admitted to the hospitalist service under the care of Dr. Adela Glimpse.  CRITICAL CARE Performed by: Geoffery Lyons Total critical care time: 30 minutes Critical care time was exclusive of separately billable procedures and treating other patients. Critical care was necessary to treat or prevent imminent or life-threatening deterioration. Critical care was time spent personally by me on the following activities: development of treatment plan with patient and/or surrogate as well as nursing, discussions with consultants, evaluation of patient's response to treatment, examination of patient, obtaining history from patient or surrogate, ordering and performing treatments and interventions, ordering and review of laboratory studies,  ordering and review of radiographic studies, pulse oximetry and re-evaluation of patient's condition.     Geoffery Lyons, MD 01/16/16 1914  Geoffery Lyons, MD 02/09/16 [phone]

## 2016-01-16 NOTE — H&P (Addendum)
PCP: Ruthe Mannan, MD  Cardiology [facility]   Referring provider Delo   Chief Complaint:  Shortness of breath  HPI: Adrienne Bowman is a 82 y.o. female   has a past medical history of CHF (congestive heart failure) (HCC) (2002); Hypertension; PVD (peripheral vascular disease) (HCC); Murmur, heart; S/P AKA (above knee amputation) (HCC); DIABETES MELLITUS, TYPE II; NEPHROPATHY, DIABETIC; Coronary artery disease (2002); Anemia; CKD (chronic kidney disease); and Advanced age.   Presented with  dyspnea since AM associated with chest pain on the left side. Patient reports she woke up from sleep with chest pain feeling like something sticking up under her left ribs not similar to her MI this was associated shortness of breath. No fevers no chills no cough she still has some persistent chest tightness and chest pain. Reports she was feeling so bad she did not take any of her home medications today  IN ER: Chest x-ray worrisome for CHF exacerbation versus atypical infectious process. Elevated BNP 3887 creatinine up from baseline 3.2 up to 4.01,  troponin slightly elevated at 0.09 Since arrival to ER her shortness of breath have improved but still has persistent chest pain.  Cardiology has been consulted. ER provider states will consult requested medical admission  Regarding pertinent past history: hx of CAD sp cabg, and peripheral vascular disease status post AKA. Patient is diabetic with chronic kidney disease Baseline reactive in around 3.2  Hospitalist was called for admission for CHF exacerbation   Review of Systems:    Pertinent positives include: chest pain, shortness of breath at rest.dyspnea on exertion,   Constitutional:  No weight loss, night sweats, Fevers, chills, fatigue, weight loss  HEENT:  No headaches, Difficulty swallowing,Tooth/dental problems,Sore throat,  No sneezing, itching, ear ache, nasal congestion, post nasal drip,  Cardio-vascular:  No  Orthopnea, PND,  anasarca, dizziness, palpitations.no Bilateral lower extremity swelling  GI:  No heartburn, indigestion, abdominal pain, nausea, vomiting, diarrhea, change in bowel habits, loss of appetite, melena, blood in stool, hematemesis Resp:  no No No excess mucus, no productive cough, No non-productive cough, No coughing up of blood.No change in color of mucus.No wheezing. Skin:  no rash or lesions. No jaundice GU:  no dysuria, change in color of urine, no urgency or frequency. No straining to urinate.  No flank pain.  Musculoskeletal:  No joint pain or no joint swelling. No decreased range of motion. No back pain.  Psych:  No change in mood or affect. No depression or anxiety. No memory loss.  Neuro: no localizing neurological complaints, no tingling, no weakness, no double vision, no gait abnormality, no slurred speech, no confusion  Otherwise ROS are negative except for above, 10 systems were reviewed  Past Medical History: Past Medical History  Diagnosis Date  . CHF (congestive heart failure) (HCC) 2002    acute CHF while getting lower extremilty arteriograms /  Led to CABG  x6  . Hypertension     very difficult to control  . PVD (peripheral vascular disease) (HCC)     extensive PVD / left AKA  . Murmur, heart     soft systolic outflow murmur and a grade 2/6 murmur of the aortic insufficiency  . S/P AKA (above knee amputation) (HCC)     left AKA with prosthesis  . DIABETES MELLITUS, TYPE II   . NEPHROPATHY, DIABETIC   . Coronary artery disease 2002    CABG x6 / left internal mammary artery graft to the LAD, a saphenous vein graft  to the diagonal, a sequential saphenous vein graft to the 1st and 2nd obtuse marginal branches, and a sequential saphenous vein graft the the distal right coronary artery and posterior descending  . Anemia   . CKD (chronic kidney disease)   . Advanced age    Past Surgical History  Procedure Laterality Date  . Coronary artery bypass graft  2002    x6 per  Dr. Laneta SimmersBartle  . Eye surgery  1987 & 1988     left eye  . Vascular surgery      multiple peripheral vascular surgeries  . Left aka  2006  . Right renal artery stent  2004  . Rfpbpg  2002  . Lower extremity angiogram  February 21, 2012  . Pr vein bypass graft,aorto-fem-pop  Oct. 17, 2012    Right  Fem-Distal post. Tibial artery BPG.  . Lower extremity angiogram N/A 02/21/2012    Procedure: LOWER EXTREMITY ANGIOGRAM;  Surgeon: Nada LibmanVance W Brabham, MD;  [facility];  Service: Cardiovascular;  Laterality: N/A;  . Lower extremity angiogram Right 12/25/2012    Procedure: LOWER EXTREMITY ANGIOGRAM;  Surgeon: Nada LibmanVance W Brabham, MD;  [facility];  Service: Cardiovascular;  Laterality: Right;  rt leg angio  . Abdominal angiogram  12/25/2012    Procedure: ABDOMINAL ANGIOGRAM;  Surgeon: Nada LibmanVance W Brabham, MD;  [facility];  Service: Cardiovascular;;     Medications: Prior to Admission medications   Medication Sig Start Date End Date Taking? Authorizing Provider  aspirin EC 81 MG tablet Take 81 mg by mouth daily.   Yes Historical Provider, MD  amlodipine-atorvastatin (CADUET) 10-10 MG tablet TAKE ONE TABLET BY MOUTH ONCE DAILY 11/23/15   Rollene RotundaJames [facility], MD  carvedilol (COREG) 25 MG tablet Take 1 tablet (25 mg total) by mouth 2 (two) times daily with a meal. 08/20/15   Dwana MelenaBryan W Hager, PA-C  cloNIDine (CATAPRES) 0.2 MG tablet TAKE ONE TABLET BY MOUTH TWICE DAILY 09/14/15   Dianne Dunalia M Aron, MD  famotidine (PEPCID) 20 MG tablet TAKE ONE TABLET BY MOUTH ONCE DAILY 12/07/15   Dianne Dunalia M Aron, MD  furosemide (LASIX) 40 MG tablet TAKE ONE TABLET BY MOUTH ONCE DAILY 12/07/15   Dianne Dunalia M Aron, MD  GLUCERNA (GLUCERNA) LIQD Take 237 mLs by mouth 2 (two) times daily between meals. 07/13/15   Renae FickleMackenzie Short, MD  hydrALAZINE (APRESOLINE) 100 MG tablet TAKE ONE TABLET BY MOUTH THREE TIMES DAILY 12/07/15   Rollene RotundaJames [facility], MD  multivitamin [facility](THERAGRAN) per tablet Take 1 tablet by mouth daily.      Historical Provider, MD   nitroGLYCERIN (NITROSTAT) 0.4 MG SL tablet Place 1 tablet (0.4 mg total) under the tongue every 5 (five) minutes as needed for chest pain. 07/13/15   Renae FickleMackenzie Short, MD    Allergies:  No Known Allergies  Social History:  Ambulatory   walker   Lives at home alone With family close by     reports that she has never smoked. She has never used smokeless tobacco. She reports that she does not drink alcohol or use illicit drugs.     Family History: family history includes Cancer in her son; Heart attack in her son; Heart disease in her mother; Hyperlipidemia in her daughter; Hypertension in her daughter, daughter, daughter, and daughter.    Physical Exam: Patient Vitals for the past 24 hrs:  BP Pulse Resp SpO2  01/16/16 2100 152/67 mmHg 88 25 96 %  01/16/16 2030 (!) 152/47 mmHg 88 (!) 30  96 %  01/16/16 2015 (!) 148/45 mmHg 88 (!) 30 96 %  01/16/16 2000 (!) 142/49 mmHg 91 (!) 36 96 %  01/16/16 1945 156/60 mmHg 96 23 95 %  01/16/16 1930 (!) 158/46 mmHg 97 (!) 39 100 %  01/16/16 1906 169/69 mmHg 100 (!) 38 98 %  01/16/16 1905 169/69 mmHg 102 (!) 32 99 %    1. General:  in No Acute distress 2. Psychological: Alert and  Oriented 3. Head/ENT:   Moist  Mucous Membranes                          Head Non traumatic, neck supple                          Normal  Dentition 4. SKIN: normal  Skin turgor,  Skin clean Dry and intact no rash 5. Heart: Regular rate and rhythm systolic Murmur, Rub or gallop 6. Lungs:  no wheezes some crackles   7. Abdomen: Soft, non-tender, Non distended 8. Lower extremities: no clubbing, cyanosis, or edema 9. Neurologically Grossly intact, moving all 4 extremities equally 10. MSK: Normal range of motion  body mass index is unknown because there is no weight on file.   Labs on Admission:   Results for orders placed or performed during the hospital encounter of 01/16/16 (from the past 24 hour(s))  Basic metabolic panel     Status: Abnormal   Collection  Time: 01/16/16  7:15 PM  Result Value Ref Range   Sodium 141 135 - 145 mmol/L   Potassium 4.3 3.5 - 5.1 mmol/L   Chloride 106 101 - 111 mmol/L   CO2 22 22 - 32 mmol/L   Glucose, Bld 259 (H) 65 - 99 mg/dL   BUN 95 (H) 6 - 20 mg/dL   Creatinine, Ser 1.61 (H) 0.44 - 1.00 mg/dL   Calcium 9.0 8.9 - 09.6 mg/dL   GFR calc non Af Amer 9 (L) >60 mL/min   GFR calc Af Amer 10 (L) >60 mL/min   Anion gap 13 5 - 15  CBC     Status: Abnormal   Collection Time: 01/16/16  7:15 PM  Result Value Ref Range   WBC 8.9 4.0 - 10.5 K/uL   RBC 3.25 (L) 3.87 - 5.11 MIL/uL   Hemoglobin 8.8 (L) 12.0 - 15.0 g/dL   HCT 04.5 (L) 40.9 - 81.1 %   MCV 86.5 78.0 - 100.0 fL   MCH 27.1 26.0 - 34.0 pg   MCHC 31.3 30.0 - 36.0 g/dL   RDW 91.4 78.2 - 95.6 %   Platelets 266 150 - 400 K/uL  Brain natriuretic peptide     Status: Abnormal   Collection Time: 01/16/16  7:15 PM  Result Value Ref Range   B Natriuretic Peptide 3887.7 (H) 0.0 - 100.0 pg/mL  I-stat troponin, ED (not at [facility])     Status: Abnormal   Collection Time: 01/16/16  7:24 PM  Result Value Ref Range   Troponin i, poc 0.09 (HH) 0.00 - 0.08 ng/mL   Comment NOTIFIED PHYSICIAN    Comment 3            UA not obtained  Lab Results  Component Value Date   HGBA1C 6.5* 07/12/2015    CrCl cannot be calculated (Unknown ideal weight.).  BNP (last 3 results) No results for input(s): PROBNP in the last 8760 hours.  Other results:  I have pearsonaly reviewed this: ECG REPORT  Rate: 102  Rhythm: Sinus tachycardia with incomplete right bundle branch block ST&T Change: ST depressions in lateral and inferior leads QTC 471  There were no vitals filed for this visit.   Cultures: No results found for: SDES, SPECREQUEST, CULT, REPTSTATUS   Radiological Exams on Admission: Dg Chest Port 1 View  01/16/2016  CLINICAL DATA:  82 year old female with shortness of breath EXAM: PORTABLE CHEST 1 VIEW COMPARISON:  Radiograph dated 07/12/2015 FINDINGS: Single  portable view of the chest demonstrate emphysematous changes of the lungs. There is diffuse interstitial prominence with areas of hazy and nodular airspace opacity predominantly involving the mid to lower lung fields. Findings most compatible with worsening interstitial edema with possible superimposed infection. Clinical correlation recommended. No significant pleural effusion identified. There is no pneumothorax. Stable cardiac silhouette. Median sternotomy wires and CABG vascular clips noted. There is osteopenia with degenerative changes of the spine. No acute fracture. IMPRESSION: Interval worsening of the pulmonary edema with possible superimposed infection. Clinical correlation is recommended. Electronically Signed   By: Elgie Collard M.D.   On: 01/16/2016 19:53    Chart has been reviewed  Family  at  Bedside  plan of care was discussed with grand Daughter Rodman Key [phone]    Assessment/Plan  82 year old female history of coronary artery disease, chronic diastolic heart failure, hypertension, peripheral vascular disease and chronic kidney disease as well as diabetes presents with chest pain shortness of breath or evidence of fluid overload and elevated troponin   Present on Admission:   acute respiratory distress  - hypoxia and increased dose work of breathing. Likely thought to be secondary to CHF exacerbation and acute pulmonary edema. Patient is afebrile infection is somewhat less likely but given atypical chest x-ray for now will cover with azithromycin and Rocephin for possible atypical pneumonia  . Acute on chronic diastolic CHF (congestive heart failure), NYHA class 1 (HCC) - - admit on telemetry, cycle cardiac enzymes, obtain serial ECG, to evaluate for ischemia as a cause of heart failure  monitor daily weight  diurese with IV lasix and monitor orthostatics and creatinine to avoid over diuresis.  Order echogram to evaluate EF and valves Not on ACE/ARBi due to  chronic kidney disease  cardiology consulted by ER provider  . Elevated troponin - in the setting of ongoing chest pain cannot rule out unstable angina and or ACS. Will initiate heparin drip for now continue to monitor on telemetry cycle cardiac enzymes discussed with patient and family the overall goals of care at this point he would like to avoid aggressive interventions especially given patient's history of chronic kidney disease she would not be good candidate for cardiac catheterization  . Essential hypertension - restart home medications  . Peripheral vascular disease, unspecified (HCC) restart home medications currently stable  . Acute on chronic renal failure (HCC) - unsure if slow progression versus worsening renal function in the setting of decreased cardiac output given pulmonary edema will continue Lasix for now and monitor carefully kidney function Family states that she's been followed by nephrology and was told that she would not be good candidate for dialysis   diabetes mellitus with chronic kidney disease will order sliding scale . CAD (coronary artery disease) -continue aspirin, initiate heparin given ongoing chest pain and abnormal EKG Check hemoglobin A1c and lipid panel  Prophylaxis: Lovenox   CODE STATUS:   DNR/DNI as per patient  family and patient would like to avoid aggressive interventions  prefer supportive measures  Disposition:  To home once workup is complete and patient is stable  Other plan as per orders.  I have spent a total of 67   min on this admission  extra time was taken due to complexity of the case  Asti Mackley 01/16/2016, 10:32 PM    [facility]  Pager [phone]   after 2 AM please page floor coverage PA If 7AM-7PM, please contact the day team taking care of the patient  [email or web address]  Password [email or web address]  Addendum nursing staff alerted me to patient having increased work of breathing maintaining oxygen saturation. With 40 mg of Lasix only  produced 100 mL of urine. We'll give additional 60 mg given history of chronic kidney disease chest x-ray showing persistent pulmonary edema although atypical infiltrates cannot be completely ruled out. We'll give morphine for increased work of breathing. Continue to cycle cardiac enzymes. As per discussion with family and patient avoid over aggressive interventions but continue supportive measures  Terris Bodin  01/17/2016  12:38 AM

## 2016-01-17 ENCOUNTER — Observation Stay (HOSPITAL_COMMUNITY): Payer: Medicare Other

## 2016-01-17 DIAGNOSIS — I5033 Acute on chronic diastolic (congestive) heart failure: Secondary | ICD-10-CM | POA: Diagnosis present

## 2016-01-17 DIAGNOSIS — Z951 Presence of aortocoronary bypass graft: Secondary | ICD-10-CM | POA: Diagnosis not present

## 2016-01-17 DIAGNOSIS — Z79899 Other long term (current) drug therapy: Secondary | ICD-10-CM | POA: Diagnosis not present

## 2016-01-17 DIAGNOSIS — I25119 Atherosclerotic heart disease of native coronary artery with unspecified angina pectoris: Secondary | ICD-10-CM | POA: Diagnosis present

## 2016-01-17 DIAGNOSIS — I472 Ventricular tachycardia: Secondary | ICD-10-CM | POA: Diagnosis present

## 2016-01-17 DIAGNOSIS — N189 Chronic kidney disease, unspecified: Secondary | ICD-10-CM | POA: Diagnosis not present

## 2016-01-17 DIAGNOSIS — E785 Hyperlipidemia, unspecified: Secondary | ICD-10-CM | POA: Diagnosis present

## 2016-01-17 DIAGNOSIS — D638 Anemia in other chronic diseases classified elsewhere: Secondary | ICD-10-CM | POA: Diagnosis present

## 2016-01-17 DIAGNOSIS — R7989 Other specified abnormal findings of blood chemistry: Secondary | ICD-10-CM | POA: Diagnosis not present

## 2016-01-17 DIAGNOSIS — Z66 Do not resuscitate: Secondary | ICD-10-CM | POA: Diagnosis present

## 2016-01-17 DIAGNOSIS — E1121 Type 2 diabetes mellitus with diabetic nephropathy: Secondary | ICD-10-CM | POA: Diagnosis present

## 2016-01-17 DIAGNOSIS — I509 Heart failure, unspecified: Secondary | ICD-10-CM

## 2016-01-17 DIAGNOSIS — R0602 Shortness of breath: Secondary | ICD-10-CM | POA: Diagnosis present

## 2016-01-17 DIAGNOSIS — I4510 Unspecified right bundle-branch block: Secondary | ICD-10-CM | POA: Diagnosis present

## 2016-01-17 DIAGNOSIS — R938 Abnormal findings on diagnostic imaging of other specified body structures: Secondary | ICD-10-CM | POA: Diagnosis not present

## 2016-01-17 DIAGNOSIS — Z8249 Family history of ischemic heart disease and other diseases of the circulatory system: Secondary | ICD-10-CM | POA: Diagnosis not present

## 2016-01-17 DIAGNOSIS — I160 Hypertensive urgency: Secondary | ICD-10-CM | POA: Diagnosis present

## 2016-01-17 DIAGNOSIS — I351 Nonrheumatic aortic (valve) insufficiency: Secondary | ICD-10-CM | POA: Diagnosis present

## 2016-01-17 DIAGNOSIS — I2510 Atherosclerotic heart disease of native coronary artery without angina pectoris: Secondary | ICD-10-CM | POA: Diagnosis not present

## 2016-01-17 DIAGNOSIS — I10 Essential (primary) hypertension: Secondary | ICD-10-CM | POA: Diagnosis not present

## 2016-01-17 DIAGNOSIS — Z681 Body mass index (BMI) 19 or less, adult: Secondary | ICD-10-CM | POA: Diagnosis not present

## 2016-01-17 DIAGNOSIS — E43 Unspecified severe protein-calorie malnutrition: Secondary | ICD-10-CM | POA: Diagnosis present

## 2016-01-17 DIAGNOSIS — Z515 Encounter for palliative care: Secondary | ICD-10-CM | POA: Diagnosis present

## 2016-01-17 DIAGNOSIS — I739 Peripheral vascular disease, unspecified: Secondary | ICD-10-CM | POA: Diagnosis not present

## 2016-01-17 DIAGNOSIS — E1151 Type 2 diabetes mellitus with diabetic peripheral angiopathy without gangrene: Secondary | ICD-10-CM | POA: Diagnosis present

## 2016-01-17 DIAGNOSIS — Z7982 Long term (current) use of aspirin: Secondary | ICD-10-CM | POA: Diagnosis not present

## 2016-01-17 DIAGNOSIS — R0902 Hypoxemia: Secondary | ICD-10-CM | POA: Diagnosis present

## 2016-01-17 DIAGNOSIS — Z89612 Acquired absence of left leg above knee: Secondary | ICD-10-CM | POA: Diagnosis not present

## 2016-01-17 DIAGNOSIS — E1122 Type 2 diabetes mellitus with diabetic chronic kidney disease: Secondary | ICD-10-CM | POA: Diagnosis present

## 2016-01-17 DIAGNOSIS — E876 Hypokalemia: Secondary | ICD-10-CM | POA: Diagnosis present

## 2016-01-17 DIAGNOSIS — I130 Hypertensive heart and chronic kidney disease with heart failure and stage 1 through stage 4 chronic kidney disease, or unspecified chronic kidney disease: Secondary | ICD-10-CM | POA: Diagnosis present

## 2016-01-17 DIAGNOSIS — Z7189 Other specified counseling: Secondary | ICD-10-CM | POA: Diagnosis not present

## 2016-01-17 DIAGNOSIS — N179 Acute kidney failure, unspecified: Secondary | ICD-10-CM | POA: Diagnosis not present

## 2016-01-17 DIAGNOSIS — I5043 Acute on chronic combined systolic (congestive) and diastolic (congestive) heart failure: Secondary | ICD-10-CM | POA: Diagnosis present

## 2016-01-17 DIAGNOSIS — N183 Chronic kidney disease, stage 3 (moderate): Secondary | ICD-10-CM | POA: Diagnosis present

## 2016-01-17 LAB — BASIC METABOLIC PANEL
Anion gap: 13 (ref 5–15)
BUN: 93 mg/dL — AB (ref 6–20)
CALCIUM: 8.7 mg/dL — AB (ref 8.9–10.3)
CO2: 21 mmol/L — ABNORMAL LOW (ref 22–32)
CREATININE: 4.06 mg/dL — AB (ref 0.44–1.00)
Chloride: 104 mmol/L (ref 101–111)
GFR calc Af Amer: 10 mL/min — ABNORMAL LOW (ref >60)
GFR, EST NON AFRICAN AMERICAN: 9 mL/min — AB (ref >60)
Glucose, Bld: 223 mg/dL — ABNORMAL HIGH (ref 65–99)
Potassium: 3.8 mmol/L (ref 3.5–5.1)
SODIUM: 138 mmol/L (ref 135–145)

## 2016-01-17 LAB — HEPARIN LEVEL (UNFRACTIONATED)
HEPARIN UNFRACTIONATED: 0.39 [IU]/mL (ref 0.30–0.70)
HEPARIN UNFRACTIONATED: 0.34 [IU]/mL (ref 0.30–0.70)
Heparin Unfractionated: 0.27 IU/mL — ABNORMAL LOW (ref 0.30–0.70)

## 2016-01-17 LAB — GLUCOSE, CAPILLARY
GLUCOSE-CAPILLARY: 158 mg/dL — AB (ref 65–99)
GLUCOSE-CAPILLARY: 167 mg/dL — AB (ref 65–99)
GLUCOSE-CAPILLARY: 114 mg/dL — AB (ref 65–99)
Glucose-Capillary: 165 mg/dL — ABNORMAL HIGH (ref 65–99)

## 2016-01-17 LAB — LIPID PANEL
CHOL/HDL RATIO: 4 ratio
CHOLESTEROL: 116 mg/dL (ref 0–200)
HDL: 29 mg/dL — ABNORMAL LOW (ref >40)
LDL CALC: 69 mg/dL (ref 0–99)
Triglycerides: 91 mg/dL (ref <150)
VLDL: 18 mg/dL (ref 0–40)

## 2016-01-17 LAB — CBC
HCT: 24.8 % — ABNORMAL LOW (ref 36.0–46.0)
HEMATOCRIT: 24.7 % — AB (ref 36.0–46.0)
Hemoglobin: 7.9 g/dL — ABNORMAL LOW (ref 12.0–15.0)
Hemoglobin: 7.7 g/dL — ABNORMAL LOW (ref 12.0–15.0)
MCH: 27.1 pg (ref 26.0–34.0)
MCH: 26.4 pg (ref 26.0–34.0)
MCHC: 31.9 g/dL (ref 30.0–36.0)
MCHC: 31.2 g/dL (ref 30.0–36.0)
MCV: 84.9 fL (ref 78.0–100.0)
MCV: 84.6 fL (ref 78.0–100.0)
PLATELETS: 214 10*3/uL (ref 150–400)
Platelets: 227 10*3/uL (ref 150–400)
RBC: 2.92 MIL/uL — ABNORMAL LOW (ref 3.87–5.11)
RBC: 2.92 MIL/uL — ABNORMAL LOW (ref 3.87–5.11)
RDW: 13 % (ref 11.5–15.5)
RDW: 13 % (ref 11.5–15.5)
WBC: 7.3 10*3/uL (ref 4.0–10.5)
WBC: 7.3 10*3/uL (ref 4.0–10.5)

## 2016-01-17 LAB — TROPONIN I
TROPONIN I: 0.7 ng/mL — AB (ref <0.031)
TROPONIN I: 0.99 ng/mL — AB (ref <0.031)
TROPONIN I: 0.99 ng/mL — AB (ref <0.031)
Troponin I: 0.21 ng/mL — ABNORMAL HIGH (ref <0.031)
Troponin I: 0.8 ng/mL (ref <0.031)

## 2016-01-17 LAB — VITAMIN B12: VITAMIN B 12: 1948 pg/mL — AB (ref 180–914)

## 2016-01-17 LAB — IRON AND TIBC
Iron: 13 ug/dL — ABNORMAL LOW (ref 28–170)
SATURATION RATIOS: 7 % — AB (ref 10.4–31.8)
TIBC: 199 ug/dL — AB (ref 250–450)
UIBC: 186 ug/dL

## 2016-01-17 LAB — INFLUENZA PANEL BY PCR (TYPE A & B)
H1N1 flu by pcr: NOT DETECTED
INFLAPCR: NEGATIVE
Influenza B By PCR: NEGATIVE

## 2016-01-17 LAB — FOLATE: Folate: >80 ng/mL (ref >5.9)

## 2016-01-17 LAB — MRSA PCR SCREENING: MRSA BY PCR: NEGATIVE

## 2016-01-17 LAB — STREP PNEUMONIAE URINARY ANTIGEN: STREP PNEUMO URINARY ANTIGEN: NEGATIVE

## 2016-01-17 LAB — BRAIN NATRIURETIC PEPTIDE

## 2016-01-17 LAB — PROCALCITONIN: Procalcitonin: <0.1 ng/mL

## 2016-01-17 LAB — RETICULOCYTES
RBC.: 2.92 MIL/uL — ABNORMAL LOW (ref 3.87–5.11)
RETIC CT PCT: 1.4 % (ref 0.4–3.1)
Retic Count, Absolute: 40.9 10*3/uL (ref 19.0–186.0)

## 2016-01-17 LAB — TSH: TSH: 2.488 u[IU]/mL (ref 0.350–4.500)

## 2016-01-17 LAB — FERRITIN: FERRITIN: 36 ng/mL (ref 11–307)

## 2016-01-17 MED ORDER — METOLAZONE 5 MG PO TABS: 2.5000 mg | ORAL_TABLET | Freq: Every day | ORAL | Status: DC

## 2016-01-17 MED ORDER — INSULIN ASPART 100 UNIT/ML ~~LOC~~ SOLN
0.0000 [IU] | Freq: Every day | SUBCUTANEOUS | Status: DC
  Administered 2016-01-20: 2 [IU] via SUBCUTANEOUS

## 2016-01-17 MED ORDER — DEXTROSE 5 % IV SOLN
1.0000 g | Freq: Every day | INTRAVENOUS | Status: DC
  Administered 2016-01-17: 1 g via INTRAVENOUS
  Filled 2016-01-17 (×2): qty 10

## 2016-01-17 MED ORDER — HEPARIN (PORCINE) IN NACL 100-0.45 UNIT/ML-% IJ SOLN
600.0000 [IU]/h | INTRAMUSCULAR | Status: DC
  Administered 2016-01-17: 500 [IU]/h via INTRAVENOUS
  Filled 2016-01-17 (×2): qty 250

## 2016-01-17 MED ORDER — METOLAZONE 5 MG PO TABS
2.5000 mg | ORAL_TABLET | Freq: Once | ORAL | Status: AC
  Administered 2016-01-17: 2.5 mg via ORAL
  Filled 2016-01-17: qty 1

## 2016-01-17 MED ORDER — WHITE PETROLATUM GEL
Status: AC
  Filled 2016-01-17: qty 1

## 2016-01-17 MED ORDER — METOLAZONE 5 MG PO TABS
2.5000 mg | ORAL_TABLET | Freq: Every day | ORAL | Status: DC
  Administered 2016-01-17 – 2016-01-21 (×5): 2.5 mg via ORAL
  Filled 2016-01-17 (×5): qty 1

## 2016-01-17 MED ORDER — CETYLPYRIDINIUM CHLORIDE 0.05 % MT LIQD
7.0000 mL | Freq: Two times a day (BID) | OROMUCOSAL | Status: DC
  Administered 2016-01-17 – 2016-01-23 (×12): 7 mL via OROMUCOSAL

## 2016-01-17 MED ORDER — DEXTROSE 5 % IV SOLN
500.0000 mg | Freq: Every day | INTRAVENOUS | Status: DC
  Administered 2016-01-17: 500 mg via INTRAVENOUS
  Filled 2016-01-17 (×2): qty 500

## 2016-01-17 MED ORDER — FUROSEMIDE 10 MG/ML IJ SOLN
80.0000 mg | Freq: Two times a day (BID) | INTRAMUSCULAR | Status: DC
  Administered 2016-01-17: 20 mg via INTRAVENOUS
  Administered 2016-01-17 – 2016-01-18 (×2): 80 mg via INTRAVENOUS
  Filled 2016-01-17 (×3): qty 8

## 2016-01-17 MED ORDER — HEPARIN BOLUS VIA INFUSION
2000.0000 [IU] | Freq: Once | INTRAVENOUS | Status: AC
  Administered 2016-01-17: 2000 [IU] via INTRAVENOUS
  Filled 2016-01-17: qty 2000

## 2016-01-17 MED ORDER — FUROSEMIDE 10 MG/ML IJ SOLN
60.0000 mg | Freq: Once | INTRAMUSCULAR | Status: AC
  Administered 2016-01-17: 60 mg via INTRAVENOUS
  Filled 2016-01-17: qty 6

## 2016-01-17 MED ORDER — INSULIN ASPART 100 UNIT/ML ~~LOC~~ SOLN
0.0000 [IU] | Freq: Three times a day (TID) | SUBCUTANEOUS | Status: DC
  Administered 2016-01-17 – 2016-01-19 (×6): 2 [IU] via SUBCUTANEOUS
  Administered 2016-01-19: 1 [IU] via SUBCUTANEOUS
  Administered 2016-01-20: 2 [IU] via SUBCUTANEOUS
  Administered 2016-01-20: 7 [IU] via SUBCUTANEOUS
  Administered 2016-01-21 (×2): 5 [IU] via SUBCUTANEOUS
  Administered 2016-01-21: 2 [IU] via SUBCUTANEOUS
  Administered 2016-01-22: 3 [IU] via SUBCUTANEOUS
  Administered 2016-01-22: 1 [IU] via SUBCUTANEOUS
  Administered 2016-01-23: 3 [IU] via SUBCUTANEOUS
  Administered 2016-01-23: 1 [IU] via SUBCUTANEOUS

## 2016-01-17 MED ORDER — ATORVASTATIN CALCIUM 10 MG PO TABS
10.0000 mg | ORAL_TABLET | Freq: Every day | ORAL | Status: DC
Start: 2016-01-17 — End: 2016-01-23
  Administered 2016-01-17 – 2016-01-22 (×6): 10 mg via ORAL
  Filled 2016-01-17 (×6): qty 1

## 2016-01-17 MED ORDER — AMLODIPINE BESYLATE 10 MG PO TABS
10.0000 mg | ORAL_TABLET | Freq: Every day | ORAL | Status: DC
  Administered 2016-01-17 – 2016-01-23 (×7): 10 mg via ORAL
  Filled 2016-01-17 (×7): qty 1

## 2016-01-17 MED ORDER — INSULIN ASPART 100 UNIT/ML ~~LOC~~ SOLN
3.0000 [IU] | Freq: Three times a day (TID) | SUBCUTANEOUS | Status: DC
  Administered 2016-01-17 – 2016-01-23 (×14): 3 [IU] via SUBCUTANEOUS

## 2016-01-17 NOTE — Progress Notes (Signed)
ANTICOAGULATION CONSULT NOTE - Initial Consult  Pharmacy Consult for Heparin  Indication: chest pain/ACS  No Known Allergies  Vital Signs: Temp: 98.1 F (36.7 C) (03/04 2345) Temp Source: Oral (03/04 2345) BP: 165/95 mmHg (03/04 2245) Pulse Rate: 90 (03/04 2245)  Labs:  Recent Labs  01/16/16 1915  HGB 8.8*  HCT 28.1*  PLT 266  CREATININE 4.01*    Medical History: Past Medical History  Diagnosis Date  . CHF (congestive heart failure) (HCC) 2002    acute CHF while getting lower extremilty arteriograms /  Led to CABG  x6  . Hypertension     very difficult to control  . PVD (peripheral vascular disease) (HCC)     extensive PVD / left AKA  . Murmur, heart     soft systolic outflow murmur and a grade 2/6 murmur of the aortic insufficiency  . S/P AKA (above knee amputation) (HCC)     left AKA with prosthesis  . DIABETES MELLITUS, TYPE II   . NEPHROPATHY, DIABETIC   . Coronary artery disease 2002    CABG x6 / left internal mammary artery graft to the LAD, a saphenous vein graft to the diagonal, a sequential saphenous vein graft to the 1st and 2nd obtuse marginal branches, and a sequential saphenous vein graft the the distal right coronary artery and posterior descending  . Anemia   . CKD (chronic kidney disease)   . Advanced age    Assessment: 82 y/o F with shortness of breath, found to have mildly elevated troponin, starting heparin, Hgb 8.8, noted CKD, other labs reviewed.   Goal of Therapy:  Heparin level 0.3-0.7 units/ml Monitor platelets by anticoagulation protocol: Yes   Plan:  -Heparin 2000 units BOLUS -Start heparin drip at 500 units/hr -0800 HL -Daily CBC/HL -Monitor for bleeding  Abran DukeLedford, Caryl Fate 01/16/2016,11:51 PM

## 2016-01-17 NOTE — Progress Notes (Addendum)
ANTICOAGULATION CONSULT NOTE - Initial Consult  Pharmacy Consult for Heparin  Indication: chest pain/ACS  No Known Allergies  Vital Signs: Temp: 97.8 F (36.6 C) (03/05 0455) Temp Source: Oral (03/05 0455) BP: 141/53 mmHg (03/05 0455) Pulse Rate: 79 (03/05 0455)  Labs:  Recent Labs  01/16/16 1915 01/16/16 2343 01/17/16 0600  HGB 8.8*  --  7.9*  HCT 28.1*  --  24.8*  PLT 266  --  227  HEPARINUNFRC  --   --  0.39  CREATININE 4.01*  --  4.06*  TROPONINI  --  0.21* 0.70*  0.80*    Medical History: Past Medical History  Diagnosis Date  . CHF (congestive heart failure) (HCC) 2002    acute CHF while getting lower extremilty arteriograms /  Led to CABG  x6  . Hypertension     very difficult to control  . PVD (peripheral vascular disease) (HCC)     extensive PVD / left AKA  . Murmur, heart     soft systolic outflow murmur and a grade 2/6 murmur of the aortic insufficiency  . S/P AKA (above knee amputation) (HCC)     left AKA with prosthesis  . DIABETES MELLITUS, TYPE II   . NEPHROPATHY, DIABETIC   . Coronary artery disease 2002    CABG x6 / left internal mammary artery graft to the LAD, a saphenous vein graft to the diagonal, a sequential saphenous vein graft to the 1st and 2nd obtuse marginal branches, and a sequential saphenous vein graft the the distal right coronary artery and posterior descending  . Anemia   . CKD (chronic kidney disease)   . Advanced age    Assessment: 82 yo F admitted 3/4 with shortness of breath, found to have mildly elevated troponin, so pharmacy was consulted to start heparin.  HL drawn ~2 hours early this morning is therapeutic at 0.39 on 500 units/hr.  Hgb is lower today 8.8>7.9, plts stable wnl. No issues per RN.  Goal of Therapy:  Heparin level 0.3-0.7 units/ml Monitor platelets by anticoagulation protocol: Yes   Plan:  - Continue heparin infusion at 500 units/hr - F/u 8-hr HL @ 1400 - Daily HL, CBC - Monitor s/sx of  bleeding  Cassie L. Roseanne RenoStewart, PharmD PGY2 Infectious Diseases Pharmacy Resident Pager: [phone] 01/17/2016 9:47 AM   Addendum: 1400 8-hr repeat HL is subtherapeutic at 0.27.  Will increase rate to 600 units/hr and f/u another 8-hr HL @ 2330.  Cassie L. Roseanne RenoStewart, PharmD PGY2 Infectious Diseases Pharmacy Resident Pager: [phone] 01/17/2016 3:17 PM

## 2016-01-17 NOTE — Progress Notes (Signed)
[facility] PROGRESS NOTE    Progress Note   Adrienne Bowman ZOX:[id number] DOB: May 05, 1918 DOA: 01/16/2016 PCP: Ruthe Mannan, MD   Brief Narrative:   Adrienne Bowman is an 82 y.o. female past medical history chronic systolic heart failure with an EF of 45%, no presents to the ED with dyspnea and left-sided chest pain that started the morning of admission  Assessment/Plan:   Acute on chronic diastolic CHF (congestive heart failure), NYHA class 1 (HCC)/  Elevated troponin/  Acute on chronic renal failure (HCC) She has a chest x-ray that shows worsening pulmonary infiltrates, with an elevated BNP, positive JVD trace lower extremity edema. She also has crackles at her bases bilaterally more predominantly on the right than on the left. I will increase IV Lasix and add metolazone, as she seems to be having poor response to diuretic therapy as an outpatient. DC IV Rocephin and azithromycin she has remained afebrile no leukocytosis, cough and her acetone is less than 0.1 2-D echo is pending. She's had a mild elevation in cardiac enzymes which could be probably multifactorial due to her chronic renal disease and acute decompensated heart failure versus ACS as her cardiac markers a doubling. Continue IV heparin. She's not candidate for cardiac intervention due to the advance age and chronic renal disease. Avoid ACE inhibitor due to advanced chronic renal disease. Authorizing her creatinine is likely due to her severe acute decompensated heart failure. We'll continue to trend. Diabetes Mellitus controlled HbA1c 6.5: Check an A1c start sliding scale insulin.  Essential hypertension: Fairly controlled continue current regimen. Hopefully diuresis will improve blood pressure.  Peripheral vascular disease, unspecified (HCC)/ S/P AKA (above knee amputation) unilateral (HCC) Continue current home regimen.    Hyperlipidemia: Cont stains.    DVT Prophylaxis - Lovenox ordered.  Family  Communication: none Disposition Plan: Unable to determine. Code Status:     Code Status Orders        Start     Ordered   01/16/16 2329  Do not attempt resuscitation (DNR)   Continuous    Question Answer Comment  In the event of cardiac or respiratory ARREST Do not call a "code blue"   In the event of cardiac or respiratory ARREST Do not perform Intubation, CPR, defibrillation or ACLS   In the event of cardiac or respiratory ARREST Use medication by any route, position, wound care, and other measures to relive pain and suffering. May use oxygen, suction and manual treatment of airway obstruction as needed for comfort.      01/16/16 2328    Code Status History    Date Active Date Inactive Code Status Order ID Comments User Context   07/12/2015  3:54 PM 07/13/2015  8:26 PM DNR [id number]  [name], MD Inpatient        IV Access:    Peripheral IV   Procedures and diagnostic studies:   Dg Chest Port 1 View  02-02-16  CLINICAL DATA:  82 year old female with chest pain and shortness of breath EXAM: PORTABLE CHEST 1 VIEW COMPARISON:  Radiograph dated 01/16/2016 FINDINGS: There is diffuse interstitial prominence with bilateral mid to lower lung field interstitial and airspace hazy densities. Overall there has been interval progression of the airspace densities since the prior study most compatible with pneumonia. There is no significant pleural effusion. No pneumothorax. The cardiac borders are silhouetted. Median sternotomy wires and CABG vascular clips noted. The aorta is tortuous. There is atherosclerotic calcification of the visualized aorta. No acute osseous pathology  identified. IMPRESSION: Interval progression of the bilateral airspace opacities. Follow-up recommended. Electronically Signed   By: Elgie CollardArash  Radparvar M.D.   On: 01/17/2016 00:58   Dg Chest Port 1 View  01/16/2016  CLINICAL DATA:  82 year old female with shortness of breath EXAM: PORTABLE CHEST 1 VIEW COMPARISON:   Radiograph dated 07/12/2015 FINDINGS: Single portable view of the chest demonstrate emphysematous changes of the lungs. There is diffuse interstitial prominence with areas of hazy and nodular airspace opacity predominantly involving the mid to lower lung fields. Findings most compatible with worsening interstitial edema with possible superimposed infection. Clinical correlation recommended. No significant pleural effusion identified. There is no pneumothorax. Stable cardiac silhouette. Median sternotomy wires and CABG vascular clips noted. There is osteopenia with degenerative changes of the spine. No acute fracture. IMPRESSION: Interval worsening of the pulmonary edema with possible superimposed infection. Clinical correlation is recommended. Electronically Signed   By: Elgie CollardArash  Radparvar M.D.   On: 01/16/2016 19:53     Medical Consultants:    None.  Anti-Infectives:   Anti-infectives    Start     Dose/Rate Route Frequency Ordered Stop   01/17/16 0130  cefTRIAXone (ROCEPHIN) 1 g in dextrose 5 % 50 mL IVPB     1 g 100 mL/hr over 30 Minutes Intravenous Daily at bedtime 01/17/16 0034     01/17/16 0130  azithromycin (ZITHROMAX) 500 mg in dextrose 5 % 250 mL IVPB     500 mg 250 mL/hr over 60 Minutes Intravenous Daily at bedtime 01/17/16 0034        Subjective:    Adrienne Bowman she relates her shortness of breath has not improved. She denies any chest pain or cough.  Objective:    Filed Vitals:   01/16/16 2345 01/16/16 2353 01/17/16 0040 01/17/16 0455  BP:  165/62 144/75 141/53  Pulse:  93 87 79  Temp: 98.1 F (36.7 C)   97.8 F (36.6 C)  TempSrc: Oral   Oral  Resp:  37 19 24  Height: 5\' 4"  (1.626 m)     Weight: 46.267 kg (102 lb)     SpO2:  96% 98% 100%    Intake/Output Summary (Last 24 hours) at 01/17/16 0842 Last data filed at 01/17/16 0617  Gross per 24 hour  Intake 326.92 ml  Output    150 ml  Net 176.92 ml   Filed Weights   01/16/16 2345  Weight: 46.267 kg (102  lb)    Exam: Gen:  Surprisingly she is awake alert and oriented 3 Cardiovascular:  RRR, positive JVD, with trace lower extremity edema. Chest and lungs:   Chest good air movement with crackles at bases worse on the right than on the left. Abdomen:  Abdomen soft, NT/ND, + BS Extremities:  trace edema   Data Reviewed:    Labs: Basic Metabolic Panel:  Recent Labs Lab 01/16/16 1915 01/17/16 0600  NA 141 138  K 4.3 3.8  CL 106 104  CO2 22 21*  GLUCOSE 259* 223*  BUN 95* 93*  CREATININE 4.01* 4.06*  CALCIUM 9.0 8.7*   GFR Estimated Creatinine Clearance: 6.1 mL/min (by C-G formula based on Cr of 4.06). Liver Function Tests: No results for input(s): AST, ALT, ALKPHOS, BILITOT, PROT, ALBUMIN in the last 168 hours. No results for input(s): LIPASE, AMYLASE in the last 168 hours. No results for input(s): AMMONIA in the last 168 hours. Coagulation profile No results for input(s): INR, PROTIME in the last 168 hours.  CBC:  Recent Labs Lab  01/16/16 1915 01/17/16 0600  WBC 8.9 7.3  HGB 8.8* 7.9*  HCT 28.1* 24.8*  MCV 86.5 84.9  PLT 266 227   Cardiac Enzymes:  Recent Labs Lab 01/16/16 2343 01/17/16 0600  TROPONINI 0.21* 0.70*  0.80*   BNP (last 3 results) No results for input(s): PROBNP in the last 8760 hours. CBG:  Recent Labs Lab 01/16/16 2348 01/17/16 0759  GLUCAP 200* 165*   D-Dimer: No results for input(s): DDIMER in the last 72 hours. Hgb A1c: No results for input(s): HGBA1C in the last 72 hours. Lipid Profile:  Recent Labs  01/17/16 0600  CHOL 116  HDL 29*  LDLCALC 69  TRIG 91  CHOLHDL 4.0   Thyroid function studies:  Recent Labs  01/17/16 0600  TSH 2.488   Anemia work up:  Recent Labs  01/17/16 0600  RETICCTPCT 1.4   Sepsis Labs:  Recent Labs Lab 01/16/16 1915 01/17/16 0040 01/17/16 0600  PROCALCITON  --  <0.10  --   WBC 8.9  --  7.3   Microbiology Recent Results (from the past 240 hour(s))  MRSA PCR Screening      Status: None   Collection Time: 01/16/16 11:23 PM  Result Value Ref Range Status   MRSA by PCR NEGATIVE NEGATIVE Final    Comment:        The GeneXpert MRSA Assay (FDA approved for NASAL specimens only), is one component of a comprehensive MRSA colonization surveillance program. It is not intended to diagnose MRSA infection nor to guide or monitor treatment for MRSA infections.      Medications:   . amLODipine  10 mg Oral Daily  . antiseptic oral rinse  7 mL Mouth Rinse BID  . aspirin EC  81 mg Oral Daily  . atorvastatin  10 mg Oral q1800  . azithromycin  500 mg Intravenous QHS  . carvedilol  25 mg Oral BID WC  . cefTRIAXone (ROCEPHIN) IVPB 1 gram/50 mL D5W  1 g Intravenous QHS  . cloNIDine  0.2 mg Oral BID  . famotidine  20 mg Oral Daily  . furosemide  60 mg Intravenous BID  . hydrALAZINE  100 mg Oral 3 times per day  . insulin aspart  0-5 Units Subcutaneous QHS  . insulin aspart  0-9 Units Subcutaneous TID WC  . levalbuterol      . sodium chloride flush  3 mL Intravenous Q12H   Continuous Infusions: . heparin 500 Units/hr (01/17/16 0054)    Time spent: 25 min     Adrienne Bowman Rosine Beat  [facility] Pager [phone]  *Please refer to [email or web address], password [email or web address] to get updated schedule on who will round on this patient, as hospitalists switch teams weekly. If 7PM-7AM, please contact night-coverage at [email or web address], password [email or web address] for any overnight needs.  01/17/2016, 8:42 AM

## 2016-01-17 NOTE — Progress Notes (Signed)
Bladder scan done, revealed 100cc.  Pt able to notify nursing staff for bedpan assistance.  Family at bedside.  Per nursing judgment, foley catheter not indicated at this time.  Will continue to monitor.

## 2016-01-17 NOTE — Consult Note (Addendum)
Cardiology Consult    Patient ID: Adrienne Bowman MRN: [id number], DOB/AGE: 1917/12/28   Admit date: 01/16/2016 Date of Consult: 01/17/2016 Primary Physician: Ruthe Mannan, MD Primary Cardiologist:  Rollene Rotunda Requesting Provider:  David Stall, MD  CC:  Dyspnea, Troponin elevation  HPI    82 yo F pt of Dr. Antoine Poche w a h/o CAD s/p CABG 2002, HFpEF, PVD s/p AKA, DM with nephropathy, HTN, HLD, Anemia, & CKD (baseline Cr 3.2) p/w dyspnea & left lower rib pain that started shortly after wakening.  She denied worsening of her rib pain with oral intake, deep breaths, or position.  She denied fevers or cough.  She denied lower extremity swelling, orthopnea, paroxysmal nocturnal dyspnea, lightheadedness, or palpitations.  She claimed adherence to her medications, though there is controversy about this in her medical records.  At baseline she lives alone & does her own cooking & cleaning.    In the ER tonight, she was found to be hypertensive on  presentation (BP 169/69).  EKG revealed LVH, anterior infarct, & inferolateral ST-T abnormalities, more prominent than her prior ECG in the system but similar to her ECG 07/12/15 when she presented with similar symptoms, except that her chest pain was previously higher in her left chest.  With today's presentation, CXR was notable for bilateral airspace opacities.  She was given NTG paste 2% & a Duoneb.  She was also given Furosemide 40 mg IV at 19:25 in the ED and 60 mg at 00:46 on the floor but has had little UOP.  The pt stated that she is feeling better with resolution of dyspnea & left lower rib pain (unclear if the timing of this was related to NTG paste placement) now despite little UOP.  She denied reduction in UOP prior to coming to the hospital.  Notably, the pt is DNR, & her family would like to be conservative in their efforts.    Past Medical History   Past Medical History  Diagnosis Date  . CHF (congestive heart failure) (HCC) 2002    acute CHF  while getting lower extremilty arteriograms /  Led to CABG  x6  . Hypertension     very difficult to control  . PVD (peripheral vascular disease) (HCC)     extensive PVD / left AKA  . Murmur, heart     soft systolic outflow murmur and a grade 2/6 murmur of the aortic insufficiency  . S/P AKA (above knee amputation) (HCC)     left AKA with prosthesis  . DIABETES MELLITUS, TYPE II   . NEPHROPATHY, DIABETIC   . Coronary artery disease 2002    CABG x6 / left internal mammary artery graft to the LAD, a saphenous vein graft to the diagonal, a sequential saphenous vein graft to the 1st and 2nd obtuse marginal branches, and a sequential saphenous vein graft the the distal right coronary artery and posterior descending  . Anemia   . CKD (chronic kidney disease)   . Advanced age     Past Surgical History  Procedure Laterality Date  . Coronary artery bypass graft  2002    x6 per Dr. Laneta Simmers  . Eye surgery  1987 & 1988     left eye  . Vascular surgery      multiple peripheral vascular surgeries  . Left aka  2006  . Right renal artery stent  2004  . Rfpbpg  2002  . Lower extremity angiogram  February 21, 2012  . Pr vein bypass  graft,aorto-fem-pop  Oct. 17, 2012    Right  Fem-Distal post. Tibial artery BPG.  . Lower extremity angiogram N/A 02/21/2012    Procedure: LOWER EXTREMITY ANGIOGRAM;  Surgeon: Nada LibmanVance W Brabham, MD;  [facility];  Service: Cardiovascular;  Laterality: N/A;  . Lower extremity angiogram Right 12/25/2012    Procedure: LOWER EXTREMITY ANGIOGRAM;  Surgeon: Nada LibmanVance W Brabham, MD;  [facility];  Service: Cardiovascular;  Laterality: Right;  rt leg angio  . Abdominal angiogram  12/25/2012    Procedure: ABDOMINAL ANGIOGRAM;  Surgeon: Nada LibmanVance W Brabham, MD;  [facility];  Service: Cardiovascular;;    Allergies  No Known Allergies  Inpatient Medications    . antiseptic oral rinse  7 mL Mouth Rinse BID  . aspirin EC  81 mg Oral Daily  . azithromycin  500 mg  Intravenous QHS  . carvedilol  25 mg Oral BID WC  . cefTRIAXone (ROCEPHIN) IVPB 1 gram/50 mL D5W  1 g Intravenous QHS  . cloNIDine  0.2 mg Oral BID  . famotidine  20 mg Oral Daily  . furosemide  60 mg Intravenous BID  . hydrALAZINE  100 mg Oral 3 times per day  . insulin aspart  0-5 Units Subcutaneous QHS  . insulin aspart  0-9 Units Subcutaneous TID WC  . levalbuterol      . metolazone  2.5 mg Oral Once  . sodium chloride flush  3 mL Intravenous Q12H   Family History    Family History  Problem Relation Age of Onset  . Coronary artery disease      prevalent in sibling  . Heart disease Mother   . Hypertension Daughter   . Cancer Son     Lung  and  Throat  . Heart attack Son   . Hyperlipidemia Daughter   . Hypertension Daughter   . Hypertension Daughter   . Hypertension Daughter    Social History    Social History   Social History  . Marital Status: Widowed    Spouse Name: N/A  . Number of Children: N/A  . Years of Education: N/A   Occupational History  . Not on file.   Social History Main Topics  . Smoking status: Never Smoker   . Smokeless tobacco: Never Used  . Alcohol Use: No  . Drug Use: No  . Sexual Activity: No   Other Topics Concern  . Not on file   Social History Narrative    Review of Systems    General:  No chills, fever, night sweats or weight changes.  Cardiovascular:  Positive for dyspnea.  No chest pain, edema, orthopnea, palpitations, paroxysmal nocturnal dyspnea. Dermatological: No rash, lesions/masses Respiratory: No cough. Urologic: No hematuria, dysuria Abdominal:   No nausea, vomiting, diarrhea, bright red blood per rectum, melena, or hematemesis Neurologic:  No visual changes, wkns, changes in mental status. All other systems reviewed and are otherwise negative except as noted above.  Physical Exam    Blood pressure 141/53, pulse 79, temperature 97.8 F (36.6 C), temperature source Oral, resp. rate 24, height 5\' 4"  (1.626 m),  weight 46.267 kg (102 lb), SpO2 100 %.  General: Pleasant, NAD Psych: Normal affect. Neuro: Alert and oriented X 3. Moves all extremities spontaneously. HEENT: Normal  Neck: JVD to mid neck at 45 degrees. Lungs:  Crackles b/l lower lobes Heart: RRR no s3, s4, or murmurs. Abdomen: Distended.  Soft, non-tender, BS + x 4.  Extremities: No clubbing, cyanosis or edema. DP/PT/Radials  2+ and equal bilaterally.  Labs    Troponin [facility] of Care Test)  Recent Labs  01/16/16 1924  TROPIPOC 0.09*    Recent Labs  01/16/16 2343  TROPONINI 0.21*   Lab Results  Component Value Date   WBC 7.3 02/15/16   HGB 7.9* 02/15/16   HCT 24.8* 02-15-16   MCV 84.9 Feb 15, 2016   PLT 227 02-15-2016    Recent Labs Lab 01/16/16 1915  NA 141  K 4.3  CL 106  CO2 22  BUN 95*  CREATININE 4.01*  CALCIUM 9.0  GLUCOSE 259*   Lab Results  Component Value Date   CHOL 117 07/13/2015   HDL 34* 07/13/2015   LDLCALC 60 07/13/2015   TRIG 116 07/13/2015   No results found for: [facility]   Radiology Studies    Dg Chest Port 1 View  02-15-16  CLINICAL DATA:  82 year old female with chest pain and shortness of breath EXAM: PORTABLE CHEST 1 VIEW COMPARISON:  Radiograph dated 01/16/2016 FINDINGS: There is diffuse interstitial prominence with bilateral mid to lower lung field interstitial and airspace hazy densities. Overall there has been interval progression of the airspace densities since the prior study most compatible with pneumonia. There is no significant pleural effusion. No pneumothorax. The cardiac borders are silhouetted. Median sternotomy wires and CABG vascular clips noted. The aorta is tortuous. There is atherosclerotic calcification of the visualized aorta. No acute osseous pathology identified. IMPRESSION: Interval progression of the bilateral airspace opacities. Follow-up recommended. Electronically Signed   By: Elgie Collard M.D.   On: 02-15-16 00:58   Dg Chest Port 1 View  01/16/2016   CLINICAL DATA:  82 year old female with shortness of breath EXAM: PORTABLE CHEST 1 VIEW COMPARISON:  Radiograph dated 07/12/2015 FINDINGS: Single portable view of the chest demonstrate emphysematous changes of the lungs. There is diffuse interstitial prominence with areas of hazy and nodular airspace opacity predominantly involving the mid to lower lung fields. Findings most compatible with worsening interstitial edema with possible superimposed infection. Clinical correlation recommended. No significant pleural effusion identified. There is no pneumothorax. Stable cardiac silhouette. Median sternotomy wires and CABG vascular clips noted. There is osteopenia with degenerative changes of the spine. No acute fracture. IMPRESSION: Interval worsening of the pulmonary edema with possible superimposed infection. Clinical correlation is recommended. Electronically Signed   By: Elgie Collard M.D.   On: 01/16/2016 19:53   Assessment & Plan    A/P:  82 yo F w a h/o CAD s/p CABG 2002, HFpEF, PVD s/p AKA, DM with nephropathy, HTN, HLD, Anemia, & CKD (baseline Cr 3.2) p/w dyspnea & pain under her left rib that started shortly after wakening.   # Dyspnea & rib pain with troponin elevation in the setting of known CAD - Her dyspnea & troponin elevation are most likely related to HFpEF, hypertensive urgency, & AKI on CKD.  She is volume overloaded on physical exam with  BNP elevation.  Her rib pain was in an unusual location to be ischemic in etiology & was more likely related to abdominal swelling.  Her inferolateral ST-T abnormalities have improved this morning with better blood pressure control.  It is concerning that she is having having a poor response to accelerating diuretic therapy.  Dr. Antoine Poche had previously attempted Imdur to address angina, but this paradoxically worsened her symptoms.     - Monitor on telemetry with serial cardiac enzymes until peaked.    - Continue to monitor strict I/O & daily weights.     -  We will plan to add a thiazide diuretic to her loop diuretic (Metolazone now, prior to her Furosemide dosing of 60 mg IV this morning).   - To address chronic anginal pain, she is appropriately on maximal Carvedilol.  We will resume her Amlodipine. - Should pain recur, could consider Ranolazine as an alternative anti-anginal agent.   - I am not opposed to the Heparin & repeat TTE but not necessarily think that these are necessary.    # Hypertension - As per above, this was elevated on presentation.  Notably, BP improved with initiation of home medications, though her Amlodipine was replaced with NTG paste. - We will discontinue the NTG paste & resume her Amlodipine.   - We will continue her Carvedilol 25 mg PO BID, Clonidine 0.2 mg PO BID, & Hydralazine 100 mg PO TID for now. - Given concerns regarding compliance, a Clonidine patch may be a better option than Clonidine pills, which can result in rebound hypertension when missed.    # AKI - This may be related to cardiorenal syndrome from increased CVP affecting her renal perfusion pressure.  She follows an outpatient nephrologist & is not an HD candidate. - Monitor response to diuresis.  # Hyperlipidemia - No transaminitis.   - We have resumed her home Atorvastatin.  Signed, Julaine Hua, MD 01/17/2016, 6:52 AM

## 2016-01-18 ENCOUNTER — Inpatient Hospital Stay (HOSPITAL_COMMUNITY): Payer: Medicare Other

## 2016-01-18 DIAGNOSIS — I10 Essential (primary) hypertension: Secondary | ICD-10-CM

## 2016-01-18 DIAGNOSIS — N189 Chronic kidney disease, unspecified: Secondary | ICD-10-CM

## 2016-01-18 DIAGNOSIS — I509 Heart failure, unspecified: Secondary | ICD-10-CM

## 2016-01-18 DIAGNOSIS — N179 Acute kidney failure, unspecified: Secondary | ICD-10-CM

## 2016-01-18 DIAGNOSIS — I5033 Acute on chronic diastolic (congestive) heart failure: Secondary | ICD-10-CM

## 2016-01-18 DIAGNOSIS — I2510 Atherosclerotic heart disease of native coronary artery without angina pectoris: Secondary | ICD-10-CM

## 2016-01-18 DIAGNOSIS — R7989 Other specified abnormal findings of blood chemistry: Secondary | ICD-10-CM

## 2016-01-18 LAB — BRAIN NATRIURETIC PEPTIDE: B Natriuretic Peptide: 2597.7 pg/mL — ABNORMAL HIGH (ref 0.0–100.0)

## 2016-01-18 LAB — BASIC METABOLIC PANEL
Anion gap: 14 (ref 5–15)
BUN: 99 mg/dL — AB (ref 6–20)
CALCIUM: 8.5 mg/dL — AB (ref 8.9–10.3)
CO2: 21 mmol/L — AB (ref 22–32)
CREATININE: 4.26 mg/dL — AB (ref 0.44–1.00)
Chloride: 101 mmol/L (ref 101–111)
GFR calc non Af Amer: 8 mL/min — ABNORMAL LOW (ref >60)
GFR, EST AFRICAN AMERICAN: 9 mL/min — AB (ref >60)
Glucose, Bld: 151 mg/dL — ABNORMAL HIGH (ref 65–99)
Potassium: 4 mmol/L (ref 3.5–5.1)
Sodium: 136 mmol/L (ref 135–145)

## 2016-01-18 LAB — HEPARIN LEVEL (UNFRACTIONATED): HEPARIN UNFRACTIONATED: 0.3 [IU]/mL (ref 0.30–0.70)

## 2016-01-18 LAB — GLUCOSE, CAPILLARY
GLUCOSE-CAPILLARY: 89 mg/dL (ref 65–99)
Glucose-Capillary: 118 mg/dL — ABNORMAL HIGH (ref 65–99)
Glucose-Capillary: 192 mg/dL — ABNORMAL HIGH (ref 65–99)
Glucose-Capillary: 173 mg/dL — ABNORMAL HIGH (ref 65–99)

## 2016-01-18 LAB — HEMOGLOBIN A1C
HEMOGLOBIN A1C: 7.1 % — AB (ref 4.8–5.6)
MEAN PLASMA GLUCOSE: 157 mg/dL

## 2016-01-18 LAB — LEGIONELLA ANTIGEN, URINE

## 2016-01-18 MED ORDER — CLONIDINE HCL 0.3 MG/24HR TD PTWK
0.3000 mg | MEDICATED_PATCH | TRANSDERMAL | Status: DC
  Administered 2016-01-18: 0.3 mg via TRANSDERMAL
  Filled 2016-01-18: qty 1

## 2016-01-18 MED ORDER — BOOST / RESOURCE BREEZE PO LIQD
1.0000 | Freq: Two times a day (BID) | ORAL | Status: DC
  Administered 2016-01-18 – 2016-01-22 (×9): 1 via ORAL

## 2016-01-18 MED ORDER — FUROSEMIDE 10 MG/ML IJ SOLN
80.0000 mg | Freq: Three times a day (TID) | INTRAMUSCULAR | Status: DC
  Administered 2016-01-18 (×2): 80 mg via INTRAVENOUS
  Filled 2016-01-18 (×2): qty 8

## 2016-01-18 NOTE — Progress Notes (Signed)
ANTICOAGULATION CONSULT NOTE - Follow Up Consult  Pharmacy Consult for Heparin Indication: chest pain/ACS  No Known Allergies  Patient Measurements: Height: 5\' 4"  (162.6 cm) Weight: 101 lb 6.9 oz (46.01 kg) IBW/kg (Calculated) : 54.7 Heparin Dosing Weight: 46 kg  Vital Signs: Temp: 98.1 F (36.7 C) (03/06 0727) Temp Source: Oral (03/06 0444) BP: 144/87 mmHg (03/06 0743) Pulse Rate: 74 (03/06 0444)  Labs:  Recent Labs  01/16/16 1915  01/17/16 0600 01/17/16 1154 01/17/16 1348 01/17/16 1815 01/17/16 2323 01/17/16 2324 01/18/16 0910  HGB 8.8*  --  7.9*  --   --   --  7.7*  --   --   HCT 28.1*  --  24.8*  --   --   --  24.7*  --   --   PLT 266  --  227  --   --   --  214  --   --   HEPARINUNFRC  --   < > 0.39  --  0.27*  --   --  0.34 0.30  CREATININE 4.01*  --  4.06*  --   --   --  4.26*  --   --   TROPONINI  --   < > 0.70*  0.80* 0.99*  --  0.99*  --   --   --   < > = values in this interval not displayed.  Estimated Creatinine Clearance: 5.7 mL/min (by C-G formula based on Cr of 4.26).  Assessment: 82 y/o F admitted 01/16/2016 with shortness of breath, found to have mildly elevated troponin.  PMH: HTN, CAD s/p CABG 2002, PVD s/p AKA, renal artery stent, HFpEF, T2DM, CKD   AntiCoag: none PTA, hep gtt for CP; first level 0.39 (drawn early), 0.27, 0.34, 0.3. Admit Hgb 8.8>7.7 today. Plts 266>214.   Goal of Therapy:  Heparin level 0.3-0.7 units/ml Monitor platelets by anticoagulation protocol: Yes   Plan:  Continue IV heparin at 600 units/hr (has been on 48hrs after MN tonight)--discontinue?  Daily HL and CBC F/u iron supplementation. Ranexa if CP should reoccur   Halima Fogal S. Merilynn Finlandobertson, PharmD, BCPS Clinical Staff Pharmacist Pager [phone]  Misty Stanleyobertson, [place] 01/18/2016,10:19 AM

## 2016-01-18 NOTE — Progress Notes (Signed)
Initial Nutrition Assessment  DOCUMENTATION CODES:   Severe malnutrition in context of chronic illness  INTERVENTION:    Boost Breeze PO BID, each supplement provides 250 kcal and 9 grams of protein.  Mighty Shakes BID with meals.  NUTRITION DIAGNOSIS:   Malnutrition related to chronic illness as evidenced by severe depletion of body fat, severe depletion of muscle mass.  GOAL:   Patient will meet greater than or equal to 90% of their needs  MONITOR:   PO intake, Supplement acceptance, Labs, Weight trends, I & O's  REASON FOR ASSESSMENT:   Malnutrition Screening Tool    ASSESSMENT:   82 y.o. female past medical history chronic systolic heart failure with an EF of 45%, presented to the ED with dyspnea and left-sided chest pain that started the morning of admission.  Patient reports that she eats okay, she doesn't really have a good appetite, as expected at [age] years old. She gets sick if she eats too much at one time. Discussed eating small amounts multiple times throughout the day to ensure adequate intake. She does not tolerate Ensure, but agreed to try [employer] Hann[employer]in[employer]ost Breeze and [employer]Mighty Shakes. She tolerates milk products well.   Diet Order:  Diet heart healthy/carb modified Room service appropriate?: Yes; Fluid consistency:: Thin; Fluid restriction:: 1800 mL Fluid  Skin:  Reviewed, no issues  Last BM:  3/3  Height:   Ht Readings from Last 1 Encounters:  01/16/16 5\' 4"  (1.626 m)    Weight:   Wt Readings from Last 1 Encounters:  01/18/16 101 lb 6.9 oz (46.01 kg)    Ideal Body Weight:  50.2 kg  BMI:  Body mass index is 17.4 kg/(m^2).  Estimated Nutritional Needs:   Kcal:  1200-1400  Protein:  65-75 gm  Fluid:  1.4 L  EDUCATION NEEDS:   Education needs addressed  Joaquin CourtsKimberly Harris, RD, LDN, CNSC Pager [phone] After Hours Pager [phone]

## 2016-01-18 NOTE — Clinical Social Work Note (Signed)
Clinical Social Work Assessment  Patient Details  Name: Adrienne Bowman MRN: [id number] Date of Birth: December 30, 1917  Date of referral:  01/18/16               Reason for consult:  Discharge Planning, Facility Placement                Permission sought to share information with:  Family Supports, Customer service manager Permission granted to share information::  Yes, Verbal Permission Granted  Name::     [employer]  Agency::  SNF[facility]  Relationship::  Daughter  Contact Information:  [phone]  Housing/Transportation Living arrangements for the past 2 months:  [place] of Information:  Patient Patient Interpreter Needed:  None Criminal Activity/Legal Involvement Pertinent to Current Situation/Hospitalization:  No - Comment as needed Significant Relationships:  Adult Children Lives with:  Self Do you feel safe going back to the place where you live?  Yes Need for family participation in patient care:  Yes (Comment)  Care giving concerns:  Patient lives at home alone. Patient daughter lives close by, but patient lives by herself.   Social Worker assessment / plan:  BSW intern met with patient and patient daughter at bedside to complete assessment. BSW intern discussed SNF with patient and patient daughter. Initially the patient did not want to go to a nursing facility, but after further explanation, patient was willing to consider SNF as an alternative plan. Patient daughter is requesting [facility] or a facility close to [place]. BSW intern will follow up with bed offers and will continue to follow and assist as needed.  Employment status:  Retired Forensic scientist:  [employer] PT Recommendations:  [place] / Referral to community resources:  [place]  Patient/Family's Response to care:  Patient and patient family appear to be satisfied with the care the patient is currently receiving at the  hospital.  Patient/Family's Understanding of and Emotional Response to Diagnosis, Current Treatment, and Prognosis:  Patient and patient family appear to understand reason for patient admission, current treatment, and post discharge needs.  Emotional Assessment Appearance:  Appears stated age Attitude/Demeanor/Rapport:   (appropriate) Affect (typically observed):  Appropriate, Pleasant Orientation:  Oriented to Self, Oriented to Place, Oriented to  Time, Oriented to Situation Alcohol / Substance use:  Not Applicable Psych involvement (Current and /or in the community):  No (Comment)  Discharge Needs  Concerns to be addressed:  No discharge needs identified Readmission within the last 30 days:  No Current discharge risk:  Lives alone Barriers to Discharge:  Continued Medical Work up   [employer], [phone]

## 2016-01-18 NOTE — Progress Notes (Signed)
SUBJECTIVE:  Feeling better.  No CP  OBJECTIVE:   Vitals:   Filed Vitals:   01/17/16 2353 01/18/16 0444 01/18/16 0727 01/18/16 0743  BP: 149/55 144/49  144/87  Pulse: 79 74    Temp: 98.2 F (36.8 C) 98.5 F (36.9 C) 98.1 F (36.7 C)   TempSrc:  Oral    Resp: 25 24    Height:      Weight:  101 lb 6.9 oz (46.01 kg)    SpO2: 100% 100%     I&O's:   Intake/Output Summary (Last 24 hours) at 01/18/16 0842 Last data filed at 01/18/16 0430  Gross per 24 hour  Intake    480 ml  Output   1000 ml  Net   -520 ml   TELEMETRY: Reviewed telemetry pt in NSR:     PHYSICAL EXAM General: Well developed, well nourished, in no acute distress Head: Eyes PERRLA, No xanthomas.   Normal cephalic and atramatic  Lungs:   Clear bilaterally to auscultation and percussion. Heart:   HRRR S1 S2 Pulses are 2+ & equal. Abdomen: Bowel sounds are positive, abdomen soft and non-tender without masses  Extremities:   No clubbing, cyanosis or edema.  DP +1 Neuro: Alert and oriented X 3. Psych:  Good affect, responds appropriately   LABS: Basic Metabolic Panel:  Recent Labs  13/24/40 0600 01/17/16 2323  NA 138 136  K 3.8 4.0  CL 104 101  CO2 21* 21*  GLUCOSE 223* 151*  BUN 93* 99*  CREATININE 4.06* 4.26*  CALCIUM 8.7* 8.5*   Liver Function Tests: No results for input(s): AST, ALT, ALKPHOS, BILITOT, PROT, ALBUMIN in the last 72 hours. No results for input(s): LIPASE, AMYLASE in the last 72 hours. CBC:  Recent Labs  01/17/16 0600 01/17/16 2323  WBC 7.3 7.3  HGB 7.9* 7.7*  HCT 24.8* 24.7*  MCV 84.9 84.6  PLT 227 214   Cardiac Enzymes:  Recent Labs  01/17/16 0600 01/17/16 1154 01/17/16 1815  TROPONINI 0.70*  0.80* 0.99* 0.99*   BNP: Invalid input(s): POCBNP D-Dimer: No results for input(s): DDIMER in the last 72 hours. Hemoglobin A1C: No results for input(s): HGBA1C in the last 72 hours. Fasting Lipid Panel:  Recent Labs  01/17/16 0600  CHOL 116  HDL 29*  LDLCALC  69  TRIG 91  CHOLHDL 4.0   Thyroid Function Tests:  Recent Labs  01/17/16 0600  TSH 2.488   Anemia Panel:  Recent Labs  01/17/16 0600  VITAMINB12 1948*  FOLATE >80.0  FERRITIN 36  TIBC 199*  IRON 13*  RETICCTPCT 1.4   Coag Panel:   No results found for: INR, PROTIME  RADIOLOGY: Dg Chest 2 View  01/17/2016  CLINICAL DATA:  Dyspnea for 1 day EXAM: CHEST  2 VIEW COMPARISON:  01/17/2016 with the patient is status post CABG with extensive aortic calcification. Mild to moderate cardiac silhouette enlargement is stable. Central venous congestion is also FINDINGS: Unchanged. There is patchy multifocal bilateral airspace opacification. Airspace opacities are predominantly alveolar with resolving interstitial opacities. Small bilateral pleural effusions are present. IMPRESSION: Findings appear most consistent with evolving pulmonary edema with mild improvement. Radiographic follow-up suggested to monitor, as the possibility of infectious infiltrate superimposed on pulmonary edema is not excluded. Electronically Signed   By: Esperanza Heir M.D.   On: 01/17/2016 12:06   Dg Chest Port 1 View  01/17/2016  CLINICAL DATA:  82 year old female with chest pain and shortness of breath EXAM: PORTABLE CHEST 1 VIEW  COMPARISON:  Radiograph dated 01/16/2016 FINDINGS: There is diffuse interstitial prominence with bilateral mid to lower lung field interstitial and airspace hazy densities. Overall there has been interval progression of the airspace densities since the prior study most compatible with pneumonia. There is no significant pleural effusion. No pneumothorax. The cardiac borders are silhouetted. Median sternotomy wires and CABG vascular clips noted. The aorta is tortuous. There is atherosclerotic calcification of the visualized aorta. No acute osseous pathology identified. IMPRESSION: Interval progression of the bilateral airspace opacities. Follow-up recommended. Electronically Signed   By: Elgie CollardArash   Radparvar M.D.   On: 01/17/2016 00:58   Dg Chest Port 1 View  01/16/2016  CLINICAL DATA:  82 year old female with shortness of breath EXAM: PORTABLE CHEST 1 VIEW COMPARISON:  Radiograph dated 07/12/2015 FINDINGS: Single portable view of the chest demonstrate emphysematous changes of the lungs. There is diffuse interstitial prominence with areas of hazy and nodular airspace opacity predominantly involving the mid to lower lung fields. Findings most compatible with worsening interstitial edema with possible superimposed infection. Clinical correlation recommended. No significant pleural effusion identified. There is no pneumothorax. Stable cardiac silhouette. Median sternotomy wires and CABG vascular clips noted. There is osteopenia with degenerative changes of the spine. No acute fracture. IMPRESSION: Interval worsening of the pulmonary edema with possible superimposed infection. Clinical correlation is recommended. Electronically Signed   By: Elgie CollardArash  Radparvar M.D.   On: 01/16/2016 19:53      ASSESSMENT/PLAN:  82 yo F w a h/o CAD s/p CABG 2002, HFpEF, PVD s/p AKA, DM with nephropathy, HTN, HLD, Anemia, & CKD (baseline Cr 3.2) p/w dyspnea & pain under her left rib that started shortly after wakening.   # Dyspnea & rib pain with troponin elevation in the setting of known CAD - Her dyspnea & troponin elevation with flat trend are most likely related to HFpEF, hypertensive urgency, & AKI on CKD. Her rib pain was in an unusual location to be ischemic in etiology & was more likely related to abdominal swelling.Dr. Antoine PocheHochrein had previously attempted Imdur to address angina, but this paradoxically worsened her symptoms.   - Continue on maximal Carvedilol and Amlodipine. - Should pain recur, could consider Ranolazine as an alternative anti-anginal agent.  - Family want conservative management given patient's advanced age.  # Hypertension - As per above, this was elevated on presentation. Notably, BP  improved with initiation of home medications but still on the high side.   - We will continue her Carvedilol 25 mg PO BID, & Hydralazine 100 mg PO TID for now. - Given concerns regarding compliance and still inadequate BP control, will change to a  Clonidine patch and increase to 0.3mg .    # AKI - This has worsened but patient has not diuresed much so may be due to worsening of intrinsic kidney disease.   She follows an outpatient nephrologist & is not an HD candidate. - Monitor response to diuresis.  # Hyperlipidemia - No transaminitis. LDL at goal.   - We have resumed her home Atorvastatin.  # Acute on chronic diastolic CHF - echo 06/2015 with normal LVF with mild MR and MR. She put out 1L yesterday and is -223 net neg.  Will recheck 2D echo to make sure EF has not changed.  Increase lasix to 80mg  TID.  Consider changing diuretic to Torsemide IV if UOP does not pick up with increased diuretic dose.  Will check a BNP and chest xray today to help guide diuretic therapy.  Anemia may be  contributing to CHF.  May need to get renal involved if patient's creatinine continues to rise and lack of response to diuretic therapy in setting of ongoing CHF.  # Anemia - chronic disease related to underlying CKD.     Quintella Reichert, MD  01/18/2016  8:42 AM

## 2016-01-18 NOTE — Progress Notes (Signed)
  Echocardiogram 2D Echocardiogram has been performed.  Arvil ChacoFoster, Riko Lumsden 01/18/2016, 10:36 AM

## 2016-01-18 NOTE — Progress Notes (Signed)
[facility] PROGRESS NOTE    Progress Note   [name]:[id number] DOB: 12-Aug-1918 DOA: 01/16/2016 PCP: Ruthe Mannan, MD   Brief Narrative:   Adrienne Bowman is an 82 y.o. female past medical history chronic systolic heart failure with an EF of 45%, no presents to the ED with dyspnea and left-sided chest pain that started the morning of admission  Assessment/Plan:   Acute on chronic diastolic CHF (congestive heart failure), NYHA class 1 (HCC)/  Elevated troponin/  Acute on chronic renal failure (HCC) Cont IV Lasix and add metolazone, as she seems to be having poor response to diuretic therapy as an outpatient. She's had a mild elevation in cardiac enzymes which are flat,  likely multifactorial due to her chronic renal disease and acute decompensated heart failure versus ACS as her cardiac markers a doubling.  I's and O's are not accurate, her weight has been stable at 46 kg.  Diabetes Mellitus controlled HbA1c 6.5: Pending A1c start sliding scale insulin.  Essential hypertension: Improved,  Hopefully diuresis will cont. improve blood pressure.  Peripheral vascular disease, unspecified (HCC)/ S/P AKA (above knee amputation) unilateral (HCC) Continue current home regimen.    Hyperlipidemia: Cont stains.    DVT Prophylaxis - Lovenox ordered.  Family Communication: none Disposition Plan: home 2-4 days Code Status:     Code Status Orders        Start     Ordered   01/16/16 2329  Do not attempt resuscitation (DNR)   Continuous    Question Answer Comment  In the event of cardiac or respiratory ARREST Do not call a "code blue"   In the event of cardiac or respiratory ARREST Do not perform Intubation, CPR, defibrillation or ACLS   In the event of cardiac or respiratory ARREST Use medication by any route, position, wound care, and other measures to relive pain and suffering. May use oxygen, suction and manual treatment of airway obstruction as needed for comfort.      01/16/16 2328    Code Status History    Date Active Date Inactive Code Status Order ID Comments User Context   07/12/2015  3:54 PM 07/13/2015  8:26 PM DNR [id number]  [name], MD Inpatient        IV Access:    Peripheral IV   Procedures and diagnostic studies:   Dg Chest 2 View  01/17/2016  CLINICAL DATA:  Dyspnea for 1 day EXAM: CHEST  2 VIEW COMPARISON:  01/17/2016 with the patient is status post CABG with extensive aortic calcification. Mild to moderate cardiac silhouette enlargement is stable. Central venous congestion is also FINDINGS: Unchanged. There is patchy multifocal bilateral airspace opacification. Airspace opacities are predominantly alveolar with resolving interstitial opacities. Small bilateral pleural effusions are present. IMPRESSION: Findings appear most consistent with evolving pulmonary edema with mild improvement. Radiographic follow-up suggested to monitor, as the possibility of infectious infiltrate superimposed on pulmonary edema is not excluded. Electronically Signed   By: Esperanza Bowman M.D.   On: 01/17/2016 12:06   Dg Chest Port 1 View  01/17/2016  CLINICAL DATA:  82 year old female with chest pain and shortness of breath EXAM: PORTABLE CHEST 1 VIEW COMPARISON:  Radiograph dated 01/16/2016 FINDINGS: There is diffuse interstitial prominence with bilateral mid to lower lung field interstitial and airspace hazy densities. Overall there has been interval progression of the airspace densities since the prior study most compatible with pneumonia. There is no significant pleural effusion. No pneumothorax. The cardiac borders are silhouetted. Median  sternotomy wires and CABG vascular clips noted. The aorta is tortuous. There is atherosclerotic calcification of the visualized aorta. No acute osseous pathology identified. IMPRESSION: Interval progression of the bilateral airspace opacities. Follow-up recommended. Electronically Signed   By: Elgie Collard M.D.   On:  01/17/2016 00:58   Dg Chest Port 1 View  01/16/2016  CLINICAL DATA:  82 year old female with shortness of breath EXAM: PORTABLE CHEST 1 VIEW COMPARISON:  Radiograph dated 07/12/2015 FINDINGS: Single portable view of the chest demonstrate emphysematous changes of the lungs. There is diffuse interstitial prominence with areas of hazy and nodular airspace opacity predominantly involving the mid to lower lung fields. Findings most compatible with worsening interstitial edema with possible superimposed infection. Clinical correlation recommended. No significant pleural effusion identified. There is no pneumothorax. Stable cardiac silhouette. Median sternotomy wires and CABG vascular clips noted. There is osteopenia with degenerative changes of the spine. No acute fracture. IMPRESSION: Interval worsening of the pulmonary edema with possible superimposed infection. Clinical correlation is recommended. Electronically Signed   By: Elgie Collard M.D.   On: 01/16/2016 19:53     Medical Consultants:    None.  Anti-Infectives:   Anti-infectives    Start     Dose/Rate Route Frequency Ordered Stop   01/17/16 0130  cefTRIAXone (ROCEPHIN) 1 g in dextrose 5 % 50 mL IVPB  Status:  Discontinued     1 g 100 mL/hr over 30 Minutes Intravenous Daily at bedtime 01/17/16 0034 01/17/16 0857   01/17/16 0130  azithromycin (ZITHROMAX) 500 mg in dextrose 5 % 250 mL IVPB  Status:  Discontinued     500 mg 250 mL/hr over 60 Minutes Intravenous Daily at bedtime 01/17/16 0034 01/17/16 0857      Subjective:    Adrienne Bowman she relates her shortness of breath has not improved. She denies any chest pain or cough.  Objective:    Filed Vitals:   01/17/16 2353 01/18/16 0444 01/18/16 0727 01/18/16 0743  BP: 149/55 144/49  144/87  Pulse: 79 74    Temp: 98.2 F (36.8 C) 98.5 F (36.9 C) 98.1 F (36.7 C)   TempSrc:  Oral    Resp: 25 24    Height:      Weight:  46.01 kg (101 lb 6.9 oz)    SpO2: 100% 100%  98%     Intake/Output Summary (Last 24 hours) at 01/18/16 1023 Last data filed at 01/18/16 0951  Gross per 24 hour  Intake    600 ml  Output   1000 ml  Net   -400 ml   Filed Weights   01/16/16 2345 01/18/16 0444  Weight: 46.267 kg (102 lb) 46.01 kg (101 lb 6.9 oz)    Exam: Gen:  Surprisingly she is awake alert and oriented 3 Cardiovascular:  RRR, positive JVD, with trace lower extremity edema. Chest and lungs:   Chest good air movement with crackles on the right than on the left. Abdomen:  Abdomen soft, NT/ND, + BS Extremities:  trace edema   Data Reviewed:    Labs: Basic Metabolic Panel:  Recent Labs Lab 01/16/16 1915 01/17/16 0600 01/17/16 2323  NA 141 138 136  K 4.3 3.8 4.0  CL 106 104 101  CO2 22 21* 21*  GLUCOSE 259* 223* 151*  BUN 95* 93* 99*  CREATININE 4.01* 4.06* 4.26*  CALCIUM 9.0 8.7* 8.5*   GFR Estimated Creatinine Clearance: 5.7 mL/min (by C-G formula based on Cr of 4.26). Liver Function Tests: No results for  input(s): AST, ALT, ALKPHOS, BILITOT, PROT, ALBUMIN in the last 168 hours. No results for input(s): LIPASE, AMYLASE in the last 168 hours. No results for input(s): AMMONIA in the last 168 hours. Coagulation profile No results for input(s): INR, PROTIME in the last 168 hours.  CBC:  Recent Labs Lab 01/16/16 1915 01/17/16 0600 01/17/16 2323  WBC 8.9 7.3 7.3  HGB 8.8* 7.9* 7.7*  HCT 28.1* 24.8* 24.7*  MCV 86.5 84.9 84.6  PLT 266 227 214   Cardiac Enzymes:  Recent Labs Lab 01/16/16 2343 01/17/16 0600 01/17/16 1154 01/17/16 1815  TROPONINI 0.21* 0.70*  0.80* 0.99* 0.99*   BNP (last 3 results) No results for input(s): PROBNP in the last 8760 hours. CBG:  Recent Labs Lab 01/17/16 0759 01/17/16 1208 01/17/16 1718 01/17/16 2103 01/18/16 0724  GLUCAP 165* 158* 167* 114* 118*   D-Dimer: No results for input(s): DDIMER in the last 72 hours. Hgb A1c: No results for input(s): HGBA1C in the last 72 hours. Lipid  Profile:  Recent Labs  01/17/16 0600  CHOL 116  HDL 29*  LDLCALC 69  TRIG 91  CHOLHDL 4.0   Thyroid function studies:  Recent Labs  01/17/16 0600  TSH 2.488   Anemia work up:  Recent Labs  01/17/16 0600  VITAMINB12 1948*  FOLATE >80.0  FERRITIN 36  TIBC 199*  IRON 13*  RETICCTPCT 1.4   Sepsis Labs:  Recent Labs Lab 01/16/16 1915 01/17/16 0040 01/17/16 0600 01/17/16 2323  PROCALCITON  --  <0.10  --   --   WBC 8.9  --  7.3 7.3   Microbiology Recent Results (from the past 240 hour(s))  MRSA PCR Screening     Status: None   Collection Time: 01/16/16 11:23 PM  Result Value Ref Range Status   MRSA by PCR NEGATIVE NEGATIVE Final    Comment:        The GeneXpert MRSA Assay (FDA approved for NASAL specimens only), is one component of a comprehensive MRSA colonization surveillance program. It is not intended to diagnose MRSA infection nor to guide or monitor treatment for MRSA infections.      Medications:   . amLODipine  10 mg Oral Daily  . antiseptic oral rinse  7 mL Mouth Rinse BID  . aspirin EC  81 mg Oral Daily  . atorvastatin  10 mg Oral q1800  . carvedilol  25 mg Oral BID WC  . cloNIDine  0.3 mg Transdermal Weekly  . famotidine  20 mg Oral Daily  . furosemide  80 mg Intravenous TID  . hydrALAZINE  100 mg Oral 3 times per day  . insulin aspart  0-5 Units Subcutaneous QHS  . insulin aspart  0-5 Units Subcutaneous QHS  . insulin aspart  0-9 Units Subcutaneous TID WC  . insulin aspart  3 Units Subcutaneous TID WC  . metolazone  2.5 mg Oral Daily  . sodium chloride flush  3 mL Intravenous Q12H   Continuous Infusions: . heparin 600 Units/hr (01/18/16 0900)    Time spent: 25 min   LOS: 1 day   Marinda ElkFELIZ ORTIZ, ABRAHAM  [facility] Pager [phone]  *Please refer to [email or web address], password [email or web address] to get updated schedule on who will round on this patient, as hospitalists switch teams weekly. If 7PM-7AM, please contact night-coverage at  [email or web address], password [email or web address] for any overnight needs.  01/18/2016, 10:23 AM

## 2016-01-18 NOTE — Progress Notes (Signed)
ANTICOAGULATION CONSULT NOTE  Pharmacy Consult for Heparin  Indication: chest pain/ACS  No Known Allergies  Vital Signs: Temp: 98.2 F (36.8 C) (03/05 2353) BP: 149/55 mmHg (03/05 2353) Pulse Rate: 79 (03/05 2353)  Labs:  Recent Labs  01/16/16 1915  01/17/16 0600 01/17/16 1154 01/17/16 1348 01/17/16 1815 01/17/16 2323 01/17/16 2324  HGB 8.8*  --  7.9*  --   --   --  7.7*  --   HCT 28.1*  --  24.8*  --   --   --  24.7*  --   PLT 266  --  227  --   --   --  214  --   HEPARINUNFRC  --   --  0.39  --  0.27*  --   --  0.34  CREATININE 4.01*  --  4.06*  --   --   --  4.26*  --   TROPONINI  --   < > 0.70*  0.80* 0.99*  --  0.99*  --   --   < > = values in this interval not displayed.  Assessment: 82 y.o. female with SOB/CHF, elevated cardiac markers, for heparin  Plan: Continue Heparin at current rate  Geannie RisenGreg Christel Bai, PharmD, BCPS

## 2016-01-19 LAB — BASIC METABOLIC PANEL
Anion gap: 17 — ABNORMAL HIGH (ref 5–15)
BUN: 109 mg/dL — AB (ref 6–20)
CALCIUM: 8.5 mg/dL — AB (ref 8.9–10.3)
CO2: 22 mmol/L (ref 22–32)
CREATININE: 4.62 mg/dL — AB (ref 0.44–1.00)
Chloride: 98 mmol/L — ABNORMAL LOW (ref 101–111)
GFR calc non Af Amer: 7 mL/min — ABNORMAL LOW (ref >60)
GFR, EST AFRICAN AMERICAN: 8 mL/min — AB (ref >60)
Glucose, Bld: 120 mg/dL — ABNORMAL HIGH (ref 65–99)
Potassium: 3.6 mmol/L (ref 3.5–5.1)
SODIUM: 137 mmol/L (ref 135–145)

## 2016-01-19 LAB — PROCALCITONIN: Procalcitonin: 3.53 ng/mL

## 2016-01-19 LAB — CBC
HCT: 24.9 % — ABNORMAL LOW (ref 36.0–46.0)
Hemoglobin: 7.9 g/dL — ABNORMAL LOW (ref 12.0–15.0)
MCH: 26.8 pg (ref 26.0–34.0)
MCHC: 31.7 g/dL (ref 30.0–36.0)
MCV: 84.4 fL (ref 78.0–100.0)
PLATELETS: 229 10*3/uL (ref 150–400)
RBC: 2.95 MIL/uL — ABNORMAL LOW (ref 3.87–5.11)
RDW: 13 % (ref 11.5–15.5)
WBC: 6.6 10*3/uL (ref 4.0–10.5)

## 2016-01-19 LAB — GLUCOSE, CAPILLARY
GLUCOSE-CAPILLARY: 118 mg/dL — AB (ref 65–99)
GLUCOSE-CAPILLARY: 130 mg/dL — AB (ref 65–99)
GLUCOSE-CAPILLARY: 168 mg/dL — AB (ref 65–99)
Glucose-Capillary: 193 mg/dL — ABNORMAL HIGH (ref 65–99)
Glucose-Capillary: 149 mg/dL — ABNORMAL HIGH (ref 65–99)

## 2016-01-19 LAB — HEPARIN LEVEL (UNFRACTIONATED): HEPARIN UNFRACTIONATED: 0.23 [IU]/mL — AB (ref 0.30–0.70)

## 2016-01-19 MED ORDER — FUROSEMIDE 10 MG/ML IJ SOLN
120.0000 mg | Freq: Three times a day (TID) | INTRAMUSCULAR | Status: DC
  Administered 2016-01-19 – 2016-01-21 (×9): 120 mg via INTRAVENOUS
  Filled 2016-01-19 (×13): qty 12

## 2016-01-19 NOTE — Progress Notes (Signed)
No charge note  Palliative Medicine Team consult was received. A family meeting is scheduled for 3:00 pm on 3/7.   If there are urgent needs or questions please call [phone]. Thank you for consulting our team to assist with this patient's care.  Algis DownsMarianne York, [place] JerseyPA-C Palliative Medicine Pager: [phone]

## 2016-01-19 NOTE — NC FL2 (Signed)
[place] MEDICAID FL2 LEVEL OF CARE SCREENING TOOL     IDENTIFICATION  Patient Name: Adrienne Bowman Birthdate: 07-15-1918 Sex: female Admission Date (Current Location): 01/16/2016  [facility] and [place] Number:  Producer, television/film/video and Address:  [facility]. [facility], [address], [place], [place] [address]      Provider Number: [id number]  Attending Physician Name and Address:  Marinda Elk, MD  Relative Name and Phone Number:       Current Level of Care: Hospital Recommended Level of Care: Skilled Nursing Facility Prior Approval Number:    Date Approved/Denied:   PASRR Number:    Discharge Plan: SNF    Current Diagnoses: Patient Active Problem List   Diagnosis Date Noted  . Abnormal chest x-ray 01/17/2016  . Acute on chronic diastolic (congestive) heart failure (HCC) 01/17/2016  . Hyperlipidemia   . Acute on chronic diastolic CHF (congestive heart failure), NYHA class 1 (HCC) 01/16/2016  . Elevated troponin 01/16/2016  . Acute on chronic renal failure (HCC) 01/16/2016  . CHF exacerbation (HCC) 01/16/2016  . Acute on chronic diastolic congestive heart failure (HCC) 01/16/2016  . Acute on chronic congestive heart failure (HCC)   . Chronic kidney disease (CKD), stage IV (severe) (HCC) 07/21/2015  . NSTEMI (non-ST elevated myocardial infarction) (HCC) 07/13/2015  . Chest pain 07/12/2015  . Diabetes mellitus with nephropathy (HCC) 07/12/2015  . S/P CABG (coronary artery bypass graft) 07/12/2015  . HLD (hyperlipidemia) 07/12/2015  . S/P AKA (above knee amputation) unilateral (HCC) 07/12/2015  . Occlusion and stenosis of carotid artery without mention of cerebral infarction 01/28/2014  . Encounter for consultation 03/18/2013  . Peripheral vascular disease, unspecified (HCC) 09/12/2012  . Atherosclerotic PVD with ulceration (HCC) 06/12/2012  . Atherosclerosis of native arteries of the extremities with intermittent claudication 02/14/2012  . Hx  of leg amputation 08/22/2011  . Impaired mobility 08/22/2011  . Leg weakness 08/05/2011  . CAD (coronary artery disease) 05/30/2011  . PVD (peripheral vascular disease) (HCC)   . Murmur, heart   . CONSTIPATION 08/16/2010  . Chronic kidney disease, stage IV (severe) (HCC) 02/01/2010  . Diabetes (HCC) 05/02/2007  . NEPHROPATHY, DIABETIC 05/02/2007  . Essential hypertension 05/02/2007  . MYOCARDIAL INFARCTION, HX OF 05/02/2007  . Congestive heart failure (HCC) 05/02/2007  . PULMONARY EDEMA 05/02/2007  . HEMOCCULT POSITIVE STOOL 05/02/2007    Orientation RESPIRATION BLADDER Height & Weight     Self, Time, Situation, Place  O2 (3L) Indwelling catheter Weight: 99 lb 4.4 oz (45.03 kg) Height:   (162.6 cm)  BEHAVIORAL SYMPTOMS/MOOD NEUROLOGICAL BOWEL NUTRITION STATUS   (appropriate, pleasant)  (n/a) Continent Diet (Heart Healthy)  AMBULATORY STATUS COMMUNICATION OF NEEDS Skin   Supervision Verbally Normal                       Personal Care Assistance Level of Assistance  Bathing, Feeding, Dressing Bathing Assistance: Limited assistance Feeding assistance: Independent Dressing Assistance: Independent     Functional Limitations Info  Sight, Hearing, Speech Sight Info: Adequate Hearing Info: Adequate Speech Info: Adequate    SPECIAL CARE FACTORS FREQUENCY  PT (By licensed PT), OT (By licensed OT)     PT Frequency: 5x/week OT Frequency: 5x/week            Contractures Contractures Info: Not present    Additional Factors Info  Code Status, Allergies, Insulin Sliding Scale Code Status Info: DNR Allergies Info: NKDA   Insulin Sliding Scale Info: 0-5  units at bedtime, 0-9 units 3x/day       Current Medications (01/19/2016):  This is the current hospital active medication list Current Facility-Administered Medications  Medication Dose Route Frequency Provider Last Rate Last Dose  . 0.9 %  sodium chloride infusion  250 mL Intravenous PRN Therisa Doyne, MD       . acetaminophen (TYLENOL) tablet 650 mg  650 mg Oral Q4H PRN Therisa Doyne, MD      . acetaminophen (TYLENOL) tablet 650 mg  650 mg Oral Q4H PRN Therisa Doyne, MD   650 mg at 01/17/16 0006  . ALPRAZolam Prudy Feeler) tablet 0.25 mg  0.25 mg Oral BID PRN Therisa Doyne, MD   0.25 mg at 01/17/16 1751  . amLODipine (NORVASC) tablet 10 mg  10 mg Oral Daily Julaine Hua, MD   10 mg at 01/19/16 0834  . antiseptic oral rinse (CPC / CETYLPYRIDINIUM CHLORIDE 0.05%) solution 7 mL  7 mL Mouth Rinse BID Marinda Elk, MD   7 mL at 01/19/16 0835  . aspirin EC tablet 81 mg  81 mg Oral Daily Therisa Doyne, MD   81 mg at 01/19/16 0834  . atorvastatin (LIPITOR) tablet 10 mg  10 mg Oral q1800 Julaine Hua, MD   10 mg at 01/18/16 1729  . carvedilol (COREG) tablet 25 mg  25 mg Oral BID WC Therisa Doyne, MD   25 mg at 01/19/16 0834  . cloNIDine (CATAPRES - Dosed in mg/24 hr) patch 0.3 mg  0.3 mg Transdermal Weekly Quintella Reichert, MD   0.3 mg at 01/18/16 1137  . famotidine (PEPCID) tablet 20 mg  20 mg Oral Daily Therisa Doyne, MD   20 mg at 01/19/16 0834  . feeding supplement (BOOST / RESOURCE BREEZE) liquid 1 Container  1 Container Oral BID BM Marinda Elk, MD   1 Container at 01/19/16 [phone]  . furosemide (LASIX) 120 mg in dextrose 5 % 50 mL IVPB  120 mg Intravenous TID Quintella Reichert, MD   120 mg at 01/19/16 0941  . hydrALAZINE (APRESOLINE) tablet 100 mg  100 mg Oral 3 times per day Therisa Doyne, MD   100 mg at 01/19/16 0834  . insulin aspart (novoLOG) injection 0-5 Units  0-5 Units Subcutaneous QHS Therisa Doyne, MD   0 Units at 01/17/16 0058  . insulin aspart (novoLOG) injection 0-5 Units  0-5 Units Subcutaneous QHS Marinda Elk, MD   0 Units at 01/17/16 2128  . insulin aspart (novoLOG) injection 0-9 Units  0-9 Units Subcutaneous TID WC Marinda Elk, MD   1 Units at 01/19/16 [phone]  . insulin aspart (novoLOG) injection 3 Units  3 Units  Subcutaneous TID WC Marinda Elk, MD   3 Units at 01/19/16 [phone]  . levalbuterol (XOPENEX) nebulizer solution 0.63 mg  0.63 mg Nebulization Q4H PRN Therisa Doyne, MD   0.63 mg at 01/17/16 1811  . metolazone (ZAROXOLYN) tablet 2.5 mg  2.5 mg Oral Daily Marinda Elk, MD   2.5 mg at 01/19/16 0834  . morphine 2 MG/ML injection 2 mg  2 mg Intravenous Q4H PRN Therisa Doyne, MD      . ondansetron (ZOFRAN) injection 4 mg  4 mg Intravenous Q6H PRN Therisa Doyne, MD      . sodium chloride flush (NS) 0.9 % injection 3 mL  3 mL Intravenous Q12H Therisa Doyne, MD   3 mL at 01/19/16 0837  . sodium chloride flush (NS) 0.9 % injection 3 mL  3 mL Intravenous PRN Therisa DoyneAnastassia Doutova, MD         Discharge Medications: Please see discharge summary for a list of discharge medications.  Relevant Imaging Results:  Relevant Lab Results:   Additional Information [id number]   Jenita SeashoreMaggie Adolpho Meenach BSW Intern, [phone]

## 2016-01-19 NOTE — Care Management Important Message (Signed)
Important Message  Patient Details  Name: Adrienne Bowman MRN: [id number] Date of Birth: 10-Feb-1918   Medicare Important Message Given:  Yes    Bernadette HoitShoffner, Leatta Alewine Coleman 01/19/2016, 8:35 AM

## 2016-01-19 NOTE — Progress Notes (Signed)
[facility] PROGRESS NOTE    Progress Note   Adrienne HeirJessie B Govoni ZOX:[id number] DOB: 06-28-1918 DOA: 01/16/2016 PCP: [name], MD   Brief Narrative:   Adrienne Bowman is an 82 y.o. female past medical history chronic systolic heart failure with an EF of 45%, no presents to the ED with dyspnea and left-sided chest pain that started the morning of admission  Assessment/Plan:   Acute on chronic diastolic CHF (congestive heart failure), NYHA class 1 (HCC)/  Elevated troponin/  Acute on chronic renal failure (HCC) Cont IV Lasix and add metolazone, as she seems to be having poor response to diuretic therapy. We continue to increase her Lasix I's and O's are not accurate, her weight has been stable at 46 kg. Will consult palliative care. We'll discontinue heparin. As her troponins are flat. She denies any chest pain.  Diabetes Mellitus controlled HbA1c: A1c 7.1, cont. sliding scale insulin.  Essential hypertension: Improved,  Hopefully diuresis will cont. improve blood pressure.  Peripheral vascular disease, unspecified (HCC)/ S/P AKA (above knee amputation) unilateral (HCC) Continue current home regimen.    Hyperlipidemia: Cont stains.    DVT Prophylaxis - Lovenox ordered.  Family Communication: none Disposition Plan: home 2 days Code Status:     Code Status Orders        Start     Ordered   01/16/16 2329  Do not attempt resuscitation (DNR)   Continuous    Question Answer Comment  In the event of cardiac or respiratory ARREST Do not call a "code blue"   In the event of cardiac or respiratory ARREST Do not perform Intubation, CPR, defibrillation or ACLS   In the event of cardiac or respiratory ARREST Use medication by any route, position, wound care, and other measures to relive pain and suffering. May use oxygen, suction and manual treatment of airway obstruction as needed for comfort.      01/16/16 2328    Code Status History    Date Active Date Inactive Code  Status Order ID Comments User Context   07/12/2015  3:54 PM 07/13/2015  8:26 PM DNR [id number]  Vassie Lollarlos Madera, MD Inpatient        IV Access:    Peripheral IV   Procedures and diagnostic studies:   Dg Chest 2 View  01/18/2016  CLINICAL DATA:  82 year old who was admitted 2 days ago with CHF and has improving though persistent shortness of breath. EXAM: CHEST  2 VIEW COMPARISON:  01/17/2016 and earlier. FINDINGS: Prior sternotomy for CABG. Cardiac silhouette moderately enlarged. Severe atherosclerosis involving the thoracic and upper abdominal aorta. Interval significant improvement in the interstitial and airspace pulmonary edema, though moderate CHF persists, asymmetric and increased in the right lung. Stable small bilateral pleural effusions. No new pulmonary parenchymal abnormalities. IMPRESSION: Improving CHF, though moderate interstitial and airspace pulmonary edema persist, asymmetric and increased in the right lung. Stable small bilateral pleural effusions. No new abnormalities. Electronically Signed   By: Hulan Saashomas  Lawrence M.D.   On: 01/18/2016 17:19   Dg Chest 2 View  01/17/2016  CLINICAL DATA:  Dyspnea for 1 day EXAM: CHEST  2 VIEW COMPARISON:  01/17/2016 with the patient is status post CABG with extensive aortic calcification. Mild to moderate cardiac silhouette enlargement is stable. Central venous congestion is also FINDINGS: Unchanged. There is patchy multifocal bilateral airspace opacification. Airspace opacities are predominantly alveolar with resolving interstitial opacities. Small bilateral pleural effusions are present. IMPRESSION: Findings appear most consistent with evolving pulmonary edema with mild improvement.  Radiographic follow-up suggested to monitor, as the possibility of infectious infiltrate superimposed on pulmonary edema is not excluded. Electronically Signed   By: Esperanza Bowman M.D.   On: 01/17/2016 12:06     Medical Consultants:    None.  Anti-Infectives:    Anti-infectives    Start     Dose/Rate Route Frequency Ordered Stop   01/17/16 0130  cefTRIAXone (ROCEPHIN) 1 g in dextrose 5 % 50 mL IVPB  Status:  Discontinued     1 g 100 mL/hr over 30 Minutes Intravenous Daily at bedtime 01/17/16 0034 01/17/16 0857   01/17/16 0130  azithromycin (ZITHROMAX) 500 mg in dextrose 5 % 250 mL IVPB  Status:  Discontinued     500 mg 250 mL/hr over 60 Minutes Intravenous Daily at bedtime 01/17/16 0034 01/17/16 0857      Subjective:    Adrienne Bowman she relates her shortness of breath is improved. She denies any chest pain or cough.  Objective:    Filed Vitals:   01/19/16 0149 01/19/16 0250 01/19/16 0340 01/19/16 0730  BP: 137/47 159/62 154/59 153/52  Pulse: 77 76 79 73  Temp:   97.9 F (36.6 C) 98 F (36.7 C)  TempSrc:    Oral  Resp: Height:      Weight:   45.03 kg (99 lb 4.4 oz)   SpO2: 99% 99% 97% 100%    Intake/Output Summary (Last 24 hours) at 01/19/16 0930 Last data filed at 01/19/16 0852  Gross per 24 hour  Intake 1093.23 ml  Output   2150 ml  Net -1056.77 ml   Filed Weights   01/16/16 2345 01/18/16 0444 01/19/16 0340  Weight: 46.267 kg (102 lb) 46.01 kg (101 lb 6.9 oz) 45.03 kg (99 lb 4.4 oz)    Exam: Gen:  Surprisingly she is awake alert and oriented 3 Cardiovascular:  RRR, positive JVD, with trace lower extremity edema. Chest and lungs:   Chest good air movement, no clear to auscultation. Abdomen:  Abdomen soft, NT/ND, + BS Extremities:  trace edema   Data Reviewed:    Labs: Basic Metabolic Panel:  Recent Labs Lab 01/16/16 1915 01/17/16 0600 01/17/16 2323 01/19/16 0453  NA 141 138 136 137  K 4.3 3.8 4.0 3.6  CL 106 104 101 98*  CO2 22 21* 21* 22  GLUCOSE 259* 223* 151* 120*  BUN 95* 93* 99* 109*  CREATININE 4.01* 4.06* 4.26* 4.62*  CALCIUM 9.0 8.7* 8.5* 8.5*   GFR Estimated Creatinine Clearance: 5.2 mL/min (by C-G formula based on Cr of 4.62). Liver Function Tests: No results for  input(s): AST, ALT, ALKPHOS, BILITOT, PROT, ALBUMIN in the last 168 hours. No results for input(s): LIPASE, AMYLASE in the last 168 hours. No results for input(s): AMMONIA in the last 168 hours. Coagulation profile No results for input(s): INR, PROTIME in the last 168 hours.  CBC:  Recent Labs Lab 01/16/16 1915 01/17/16 0600 01/17/16 2323 01/19/16 0453  WBC 8.9 7.3 7.3 6.6  HGB 8.8* 7.9* 7.7* 7.9*  HCT 28.1* 24.8* 24.7* 24.9*  MCV 86.5 84.9 84.6 84.4  PLT 266 227 214 229   Cardiac Enzymes:  Recent Labs Lab 01/16/16 2343 01/17/16 0600 01/17/16 1154 01/17/16 1815  TROPONINI 0.21* 0.70*  0.80* 0.99* 0.99*   BNP (last 3 results) No results for input(s): PROBNP in the last 8760 hours. CBG:  Recent Labs Lab 01/18/16 1108 01/18/16 1629 01/18/16 2148 01/19/16 0148 01/19/16 0715  GLUCAP 192* 173* 89  118* 130*   D-Dimer: No results for input(s): DDIMER in the last 72 hours. Hgb A1c:  Recent Labs  01/17/16 0600  HGBA1C 7.1*   Lipid Profile:  Recent Labs  01/17/16 0600  CHOL 116  HDL 29*  LDLCALC 69  TRIG 91  CHOLHDL 4.0   Thyroid function studies:  Recent Labs  01/17/16 0600  TSH 2.488   Anemia work up:  Recent Labs  01/17/16 0600  VITAMINB12 1948*  FOLATE >80.0  FERRITIN 36  TIBC 199*  IRON 13*  RETICCTPCT 1.4   Sepsis Labs:  Recent Labs Lab 01/16/16 1915 01/17/16 0040 01/17/16 0600 01/17/16 2323 01/19/16 0453  PROCALCITON  --  <0.10  --   --  3.53  WBC 8.9  --  7.3 7.3 6.6   Microbiology Recent Results (from the past 240 hour(s))  MRSA PCR Screening     Status: None   Collection Time: 01/16/16 11:23 PM  Result Value Ref Range Status   MRSA by PCR NEGATIVE NEGATIVE Final    Comment:        The GeneXpert MRSA Assay (FDA approved for NASAL specimens only), is one component of a comprehensive MRSA colonization surveillance program. It is not intended to diagnose MRSA infection nor to guide or monitor treatment for MRSA  infections.   Culture, blood (routine x 2) Call MD if unable to obtain prior to antibiotics being given     Status: None (Preliminary result)   Collection Time: 01/17/16  2:11 AM  Result Value Ref Range Status   Specimen Description BLOOD LEFT ARM  Final   Special Requests BOTTLES DRAWN AEROBIC AND ANAEROBIC 5CC  Final   Culture NO GROWTH 1 DAY  Final   Report Status PENDING  Incomplete  Culture, blood (routine x 2) Call MD if unable to obtain prior to antibiotics being given     Status: None (Preliminary result)   Collection Time: 01/17/16  2:12 AM  Result Value Ref Range Status   Specimen Description BLOOD RIGHT HAND  Final   Special Requests BOTTLES DRAWN AEROBIC ONLY 3CC  Final   Culture NO GROWTH 1 DAY  Final   Report Status PENDING  Incomplete     Medications:   . amLODipine  10 mg Oral Daily  . antiseptic oral rinse  7 mL Mouth Rinse BID  . aspirin EC  81 mg Oral Daily  . atorvastatin  10 mg Oral q1800  . carvedilol  25 mg Oral BID WC  . cloNIDine  0.3 mg Transdermal Weekly  . famotidine  20 mg Oral Daily  . feeding supplement  1 Container Oral BID BM  . furosemide  120 mg Intravenous TID  . hydrALAZINE  100 mg Oral 3 times per day  . insulin aspart  0-5 Units Subcutaneous QHS  . insulin aspart  0-5 Units Subcutaneous QHS  . insulin aspart  0-9 Units Subcutaneous TID WC  . insulin aspart  3 Units Subcutaneous TID WC  . metolazone  2.5 mg Oral Daily  . sodium chloride flush  3 mL Intravenous Q12H   Continuous Infusions: . heparin 600 Units/hr (01/18/16 1800)    Time spent: 25 min   LOS: 2 days   Marinda Elk  [facility] Pager [phone]  *Please refer to [email or web address], password [email or web address] to get updated schedule on who will round on this patient, as hospitalists switch teams weekly. If 7PM-7AM, please contact night-coverage at [email or web address], password [email or web address] for any overnight needs.  01/19/2016, 9:30 AM

## 2016-01-19 NOTE — Clinical Documentation Improvement (Signed)
Internal Medicine   Possible Conditions:   Malnutrition  Document Severity - Severe(third degree), Moderate (second degree), Mild (first degree)  Form - Kwashi[place]or (rarely seen in the [place] Marasmus, Other Condition, Unable to Determine  Other condition  Unable to clinically determine  Document any associated diagnoses/conditions. Please update your documentation within the medical record to reflect your response to this query. Thank you.  Supporting Information: Initial Nutrition Assessment :   DOCUMENTATION CODES: Severe malnutrition in context of chronic illness   INTERVENTION:  . Boost Breeze PO BID, each supplement provides 250 kcal and 9 grams of protein.  . Mighty Shakes BID with meals.   NUTRITION DIAGNOSIS:  Malnutrition related to chronic illness as evidenced by severe depletion of body fat, severe depletion of muscle mass.   Please exercise your independent, professional judgment when responding. A specific answer is not anticipated or expected.  Thank You, Nevin BloodgoodJoan B Nastacia Raybuck, RN, BSN, CCDS,Clinical Documentation Specialist:  [phone]  [phone]=Cell [place]- Health Information Management

## 2016-01-19 NOTE — Progress Notes (Addendum)
SUBJECTIVE:  Feeling better today  OBJECTIVE:   Vitals:   Filed Vitals:   01/19/16 0149 01/19/16 0250 01/19/16 0340 01/19/16 0730  BP: 137/47 159/62 154/59 153/52  Pulse: 77 76 79 73  Temp:   97.9 F (36.6 C) 98 F (36.7 C)  TempSrc:    Oral  Resp: Height:      Weight:   99 lb 4.4 oz (45.03 kg)   SpO2: 99% 99% 97% 100%   I&O's:   Intake/Output Summary (Last 24 hours) at 01/19/16 0801 Last data filed at 01/19/16 0600  Gross per 24 hour  Intake 973.23 ml  Output   1550 ml  Net -576.77 ml   TELEMETRY: Reviewed telemetry pt in NSR with a 28 beat run of NSVT.  Bradycardia at night with HR into the 40's with sinus pauses.    PHYSICAL EXAM General: Well developed, well nourished, in no acute distress Head: Eyes PERRLA, No xanthomas.   Normal cephalic and atramatic  Lungs:   Crackles at bases with decreased BS Heart:   HRRR S1 S2 Pulses are 2+ & equal. Abdomen: Bowel sounds are positive, abdomen soft and non-tender without masses Extremities:   No clubbing, cyanosis or edema.  DP +1 Neuro: Alert and oriented X 3. Psych:  Good affect, responds appropriately   LABS: Basic Metabolic Panel:  Recent Labs  16/10/96 2323 01/19/16 0453  NA 136 137  K 4.0 3.6  CL 101 98*  CO2 21* 22  GLUCOSE 151* 120*  BUN 99* 109*  CREATININE 4.26* 4.62*  CALCIUM 8.5* 8.5*   Liver Function Tests: No results for input(s): AST, ALT, ALKPHOS, BILITOT, PROT, ALBUMIN in the last 72 hours. No results for input(s): LIPASE, AMYLASE in the last 72 hours. CBC:  Recent Labs  01/17/16 2323 01/19/16 0453  WBC 7.3 6.6  HGB 7.7* 7.9*  HCT 24.7* 24.9*  MCV 84.6 84.4  PLT 214 229   Cardiac Enzymes:  Recent Labs  01/17/16 0600 01/17/16 1154 01/17/16 1815  TROPONINI 0.70*  0.80* 0.99* 0.99*   BNP: Invalid input(s): POCBNP D-Dimer: No results for input(s): DDIMER in the last 72 hours. Hemoglobin A1C:  Recent Labs  01/17/16 0600  HGBA1C 7.1*   Fasting Lipid  Panel:  Recent Labs  01/17/16 0600  CHOL 116  HDL 29*  LDLCALC 69  TRIG 91  CHOLHDL 4.0   Thyroid Function Tests:  Recent Labs  01/17/16 0600  TSH 2.488   Anemia Panel:  Recent Labs  01/17/16 0600  VITAMINB12 1948*  FOLATE >80.0  FERRITIN 36  TIBC 199*  IRON 13*  RETICCTPCT 1.4   Coag Panel:   No results found for: INR, PROTIME  RADIOLOGY: Dg Chest 2 View  01/18/2016  CLINICAL DATA:  82 year old who was admitted 2 days ago with CHF and has improving though persistent shortness of breath. EXAM: CHEST  2 VIEW COMPARISON:  01/17/2016 and earlier. FINDINGS: Prior sternotomy for CABG. Cardiac silhouette moderately enlarged. Severe atherosclerosis involving the thoracic and upper abdominal aorta. Interval significant improvement in the interstitial and airspace pulmonary edema, though moderate CHF persists, asymmetric and increased in the right lung. Stable small bilateral pleural effusions. No new pulmonary parenchymal abnormalities. IMPRESSION: Improving CHF, though moderate interstitial and airspace pulmonary edema persist, asymmetric and increased in the right lung. Stable small bilateral pleural effusions. No new abnormalities. Electronically Signed   By: Hulan Saas M.D.   On: 01/18/2016 17:19   Dg Chest 2 View  01/17/2016  CLINICAL DATA:  Dyspnea for 1 day EXAM: CHEST  2 VIEW COMPARISON:  01/17/2016 with the patient is status post CABG with extensive aortic calcification. Mild to moderate cardiac silhouette enlargement is stable. Central venous congestion is also FINDINGS: Unchanged. There is patchy multifocal bilateral airspace opacification. Airspace opacities are predominantly alveolar with resolving interstitial opacities. Small bilateral pleural effusions are present. IMPRESSION: Findings appear most consistent with evolving pulmonary edema with mild improvement. Radiographic follow-up suggested to monitor, as the possibility of infectious infiltrate superimposed on  pulmonary edema is not excluded. Electronically Signed   By: Esperanza Heir M.D.   On: 01/17/2016 12:06   Dg Chest Port 1 View  01/17/2016  CLINICAL DATA:  82 year old female with chest pain and shortness of breath EXAM: PORTABLE CHEST 1 VIEW COMPARISON:  Radiograph dated 01/16/2016 FINDINGS: There is diffuse interstitial prominence with bilateral mid to lower lung field interstitial and airspace hazy densities. Overall there has been interval progression of the airspace densities since the prior study most compatible with pneumonia. There is no significant pleural effusion. No pneumothorax. The cardiac borders are silhouetted. Median sternotomy wires and CABG vascular clips noted. The aorta is tortuous. There is atherosclerotic calcification of the visualized aorta. No acute osseous pathology identified. IMPRESSION: Interval progression of the bilateral airspace opacities. Follow-up recommended. Electronically Signed   By: Elgie Collard M.D.   On: 01/17/2016 00:58   Dg Chest Port 1 View  01/16/2016  CLINICAL DATA:  82 year old female with shortness of breath EXAM: PORTABLE CHEST 1 VIEW COMPARISON:  Radiograph dated 07/12/2015 FINDINGS: Single portable view of the chest demonstrate emphysematous changes of the lungs. There is diffuse interstitial prominence with areas of hazy and nodular airspace opacity predominantly involving the mid to lower lung fields. Findings most compatible with worsening interstitial edema with possible superimposed infection. Clinical correlation recommended. No significant pleural effusion identified. There is no pneumothorax. Stable cardiac silhouette. Median sternotomy wires and CABG vascular clips noted. There is osteopenia with degenerative changes of the spine. No acute fracture. IMPRESSION: Interval worsening of the pulmonary edema with possible superimposed infection. Clinical correlation is recommended. Electronically Signed   By: Elgie Collard M.D.   On: 01/16/2016  19:53    ASSESSMENT/PLAN: 82 yo F w a h/o CAD s/p CABG 2002, HFpEF, PVD s/p AKA, DM with nephropathy, HTN, HLD, Anemia, & CKD (baseline Cr 3.2) p/w dyspnea & pain under her left rib that started shortly after wakening.   # Dyspnea & rib pain with troponin elevation in the setting of known CAD - Her dyspnea & troponin elevation with flat trend are most likely related to HFpEF, hypertensive urgency, & AKI on CKD. Her rib pain was in an unusual location to be ischemic in etiology & was more likely related to abdominal swelling.Dr. Antoine Poche had previously attempted Imdur to address angina, but this paradoxically worsened her symptoms.  - Continue on maximal Carvedilol and Amlodipine. - Should pain recur, could consider Ranolazine as an alternative anti-anginal agent.  - Family want conservative management given patient's advanced age.  # Hypertension - As per above, this was elevated on presentation. Notably, BP improved with initiation of home medications but still on the high side.  - We will continue her Carvedilol 25 mg PO BID, & Hydralazine 100 mg PO TID for now. - Given concerns regarding compliance and still inadequate BP control, Clonidine increased and changed to patch but BP still elevated.    # AKI - This has worsened but patient has  not diuresed much despite increasing IV lasix and metolazone so I suspect this is more due to  worsening of intrinsic kidney disease from HTN. She follows an outpatient nephrologist & is not an HD candidate.   - Not much to offer at this point.  Would consider Palliative Care consult.    # Hyperlipidemia - No transaminitis. LDL at goal.  - We have resumed her home Atorvastatin.  # Acute on chronic diastolic CHF - echo 06/2015 with normal LVF with mild MR and MR. She put out 1.5L yesterday and is 900cc net neg.  2D echo with EF 50-55% with grade I DD.Will increase Lasix to 120mg  TID to see if UOP picks up.  BNP is trending downward and chest  xray shows some improvement. Anemia may be contributing to CHF but I suspect she remains volume overloaded more from worsening intrinsic kidney disease. Would consider Palliative Care consult.  # Anemia - chronic disease related to underlying CKD.   # NSVT - continue BB therapy.  EF normal on Echo.  This could be due to ischemia.  No further cardiac w/u as stated above.   Quintella ReichertURNER,TRACI R, MD  01/19/2016  8:01 AM

## 2016-01-20 DIAGNOSIS — Z515 Encounter for palliative care: Secondary | ICD-10-CM | POA: Insufficient documentation

## 2016-01-20 DIAGNOSIS — E43 Unspecified severe protein-calorie malnutrition: Secondary | ICD-10-CM

## 2016-01-20 DIAGNOSIS — Z7189 Other specified counseling: Secondary | ICD-10-CM

## 2016-01-20 LAB — BASIC METABOLIC PANEL
Anion gap: 16 — ABNORMAL HIGH (ref 5–15)
BUN: 111 mg/dL — ABNORMAL HIGH (ref 6–20)
CALCIUM: 8.4 mg/dL — AB (ref 8.9–10.3)
CO2: 23 mmol/L (ref 22–32)
CREATININE: 4.66 mg/dL — AB (ref 0.44–1.00)
Chloride: 96 mmol/L — ABNORMAL LOW (ref 101–111)
GFR calc non Af Amer: 7 mL/min — ABNORMAL LOW (ref >60)
GFR, EST AFRICAN AMERICAN: 8 mL/min — AB (ref >60)
Glucose, Bld: 126 mg/dL — ABNORMAL HIGH (ref 65–99)
Potassium: 3.3 mmol/L — ABNORMAL LOW (ref 3.5–5.1)
Sodium: 135 mmol/L (ref 135–145)

## 2016-01-20 LAB — GLUCOSE, CAPILLARY
Glucose-Capillary: 333 mg/dL — ABNORMAL HIGH (ref 65–99)
Glucose-Capillary: 154 mg/dL — ABNORMAL HIGH (ref 65–99)
Glucose-Capillary: 226 mg/dL — ABNORMAL HIGH (ref 65–99)

## 2016-01-20 MED ORDER — POTASSIUM CHLORIDE CRYS ER 20 MEQ PO TBCR
40.0000 meq | EXTENDED_RELEASE_TABLET | Freq: Once | ORAL | Status: AC
  Administered 2016-01-20: 40 meq via ORAL
  Filled 2016-01-20: qty 2

## 2016-01-20 MED ORDER — POTASSIUM CHLORIDE CRYS ER 20 MEQ PO TBCR: 40.0000 meq | EXTENDED_RELEASE_TABLET | Freq: Two times a day (BID) | ORAL | Status: DC

## 2016-01-20 NOTE — Clinical Social Work Note (Signed)
Patient's PASRR number is [id number] A.  Roddie McBryant Commodore [place]w MSW, [place]CS[place], [place], [phone]

## 2016-01-20 NOTE — Evaluation (Signed)
Physical Therapy Evaluation Patient Details Name: Adrienne Bowman MRN: [id number] DOB: August 03, 1918 Today's Date: 01/20/2016   History of Present Illness  Pt is a 82 y/o F who presented to the ED w/ dyspnea and Lt sided chest pain.  Pt w/ acute on chronic diastolic CHF, elevated troponin, and acute on chronic renal failure.  Pt's PMH includes PBD, Lt AKA, anemia.  Clinical Impression  Pt admitted with above diagnosis. Pt currently with functional limitations due to the deficits listed below (see PT Problem List). Adrienne Bowman reports she was Ind PTA using RW to ambulate in her home and self-propelling her WC for farther distances.  She says she was Ind w/ all ADLs.  She currently requires min guard>min assist for safe short distance ambulation in room using RW.  Recommending SNF unless family can provide 24/7 supervision. Pt will benefit from skilled PT to increase their independence and safety with mobility to allow discharge to the venue listed below.      Follow Up Recommendations SNF;Supervision for mobility/OOB    Equipment Recommendations  None recommended by PT    Recommendations for Other Services OT consult     Precautions / Restrictions Precautions Precautions: Fall Precaution Comments: Impulsive Required Braces or Orthoses: Other Brace/Splint Other Brace/Splint: h/o Lt AKA w/ prosthesis Restrictions Weight Bearing Restrictions: No      Mobility  Bed Mobility Overal bed mobility: Modified Independent             General bed mobility comments: Uses bed rail but no physical assist or cues needed  Transfers Overall transfer level: Needs assistance Equipment used: Rolling walker (2 wheeled) Transfers: Sit to/from Stand Sit to Stand: Min guard         General transfer comment: Pt impulsive and attempt to stand multiple times before lines arranged and despite multiple verbal cues to wait.  No cues needed for technique using RW but min gaurd assist as pt demonstrates  instability upon standing.  Pt Ind donned prosthesis prior to ambulation.  Ambulation/Gait Ambulation/Gait assistance: Min assist;+2 safety/equipment Ambulation Distance (Feet): 20 Feet Assistive device: Rolling walker (2 wheeled) Gait Pattern/deviations: Step-to pattern;Step-through pattern;Decreased stride length;Antalgic;Trunk flexed   Gait velocity interpretation: <1.8 ft/sec, indicative of risk for recurrent falls General Gait Details: Trunk flexed w/ minimal improvement follow verbal cues for upright posture.  Pt fatigues quickly and requests to sit.  Stairs            Wheelchair Mobility    Modified Rankin (Stroke Patients Only)       Balance Overall balance assessment: Needs assistance (denies h/o falls in the past 6 months. ) Sitting-balance support: Feet supported;No upper extremity supported Sitting balance-Leahy Scale: Good     Standing balance support: Bilateral upper extremity supported;During functional activity Standing balance-Leahy Scale: Fair Standing balance comment: Pt able to stand from bed and adjust Lt LE prosthesis w/ min guard assist                             Pertinent Vitals/Pain Pain Assessment: No/denies pain    Home Living Family/patient expects to be discharged to:: Private residence Living Arrangements: Alone Available Help at Discharge: Family;Available PRN/intermittently Type of Home: House Home Access: Ramped entrance     Home Layout: One level Home Equipment: Walker - 2 wheels;Walker - 4 wheels;Bedside commode;Grab bars - toilet;Grab bars - tub/shower Additional Comments: Pt's daughter comes over sometimes after work to eat dinner w/ pt  Prior Function Level of Independence: Independent with assistive device(s)         Comments: Pt reports she uses her RW for short distance ambulation in home and has a WC that she can self-propel for farther distances.  Her daughter drives her.  She says she is independent w/  taking a bath and dressing.     Hand Dominance        Extremity/Trunk Assessment   Upper Extremity Assessment: Generalized weakness           Lower Extremity Assessment: Generalized weakness;LLE deficits/detail   LLE Deficits / Details: h/o Lt AKA w/ severe muscle atrophy throughout  Cervical / Trunk Assessment: Kyphotic  Communication   Communication: No difficulties  Cognition Arousal/Alertness: Awake/alert Behavior During Therapy: Impulsive Overall Cognitive Status: Within Functional Limits for tasks assessed                      General Comments      Exercises        Assessment/Plan    PT Assessment Patient needs continued PT services  PT Diagnosis Difficulty walking;Generalized weakness   PT Problem List Decreased strength;Decreased range of motion;Decreased activity tolerance;Decreased balance;Decreased mobility;Decreased safety awareness  PT Treatment Interventions DME instruction;Gait training;Functional mobility training;Therapeutic activities;Therapeutic exercise;Balance training;Neuromuscular re-education;Patient/family education;Wheelchair mobility training   PT Goals (Current goals can be found in the Care Plan section) Acute Rehab PT Goals Patient Stated Goal: to go home PT Goal Formulation: With patient Time For Goal Achievement: 02/03/16 Potential to Achieve Goals: Good    Frequency Min 3X/week   Barriers to discharge Decreased caregiver support lives alone    Co-evaluation               End of Session Equipment Utilized During Treatment: Gait belt Activity Tolerance: Patient limited by fatigue Patient left: in chair;with call bell/phone within reach;with chair alarm set Nurse Communication: Mobility status         Time: [id number] PT Time Calculation (min) (ACUTE ONLY): 32 min   Charges:   PT Evaluation $PT Eval Moderate Complexity: 1 Procedure PT Treatments $Gait Training: 8-22 mins   PT G Codes:       Encarnacion ChuAshley  Abashian PT, DPT  Pager: [phone] Phone: [phone] 01/20/2016, 11:29 AM

## 2016-01-20 NOTE — Progress Notes (Signed)
[facility] PROGRESS NOTE    Progress Note   ZAKIAH GAUTHREAUX Bowman:[id number] DOB: August 12, 1918 DOA: 01/16/2016 PCP: Ruthe Mannan, MD   Brief Narrative:   Adrienne Bowman is an 82 y.o. female past medical history chronic systolic heart failure with an EF of 45%, no presents to the ED with dyspnea and left-sided chest pain that started the morning of admission  Assessment/Plan:   Acute on chronic diastolic CHF (congestive heart failure), NYHA class 1 (HCC)/  Elevated troponin/  Acute on chronic renal failure (HCC) Cont IV Lasix and add metolazone, as she seems to be having poor response to diuretic therapy. We continue to increase her Lasix, appreciate cardiology's assistance. She continues to be negative. I's and O's are not accurate, her weight is slowly improving. Palliative care to meet with family today we appreciate assistance.  Diabetes Mellitus controlled HbA1c: A1c 7.1, cont. sliding scale insulin.  Essential hypertension: Improved,  Hopefully diuresis will cont. improve blood pressure.  Peripheral vascular disease, unspecified (HCC)/ S/P AKA (above knee amputation) unilateral (HCC) Continue current home regimen.    Hyperlipidemia: Cont stains.    DVT Prophylaxis - Lovenox ordered.  Family Communication: none Disposition Plan: Unable to determine. Code Status:     Code Status Orders        Start     Ordered   01/16/16 2329  Do not attempt resuscitation (DNR)   Continuous    Question Answer Comment  In the event of cardiac or respiratory ARREST Do not call a "code blue"   In the event of cardiac or respiratory ARREST Do not perform Intubation, CPR, defibrillation or ACLS   In the event of cardiac or respiratory ARREST Use medication by any route, position, wound care, and other measures to relive pain and suffering. May use oxygen, suction and manual treatment of airway obstruction as needed for comfort.      01/16/16 2328    Code Status History    Date  Active Date Inactive Code Status Order ID Comments User Context   07/12/2015  3:54 PM 07/13/2015  8:26 PM DNR [id number]  [name], MD Inpatient        IV Access:    Peripheral IV   Procedures and diagnostic studies:   Dg Chest 2 View  01/18/2016  CLINICAL DATA:  82 year old who was admitted 2 days ago with CHF and has improving though persistent shortness of breath. EXAM: CHEST  2 VIEW COMPARISON:  01/17/2016 and earlier. FINDINGS: Prior sternotomy for CABG. Cardiac silhouette moderately enlarged. Severe atherosclerosis involving the thoracic and upper abdominal aorta. Interval significant improvement in the interstitial and airspace pulmonary edema, though moderate CHF persists, asymmetric and increased in the right lung. Stable small bilateral pleural effusions. No new pulmonary parenchymal abnormalities. IMPRESSION: Improving CHF, though moderate interstitial and airspace pulmonary edema persist, asymmetric and increased in the right lung. Stable small bilateral pleural effusions. No new abnormalities. Electronically Signed   By: Hulan Saas M.D.   On: 01/18/2016 17:19     Medical Consultants:    None.  Anti-Infectives:   Anti-infectives    Start     Dose/Rate Route Frequency Ordered Stop   01/17/16 0130  cefTRIAXone (ROCEPHIN) 1 g in dextrose 5 % 50 mL IVPB  Status:  Discontinued     1 g 100 mL/hr over 30 Minutes Intravenous Daily at bedtime 01/17/16 0034 01/17/16 0857   01/17/16 0130  azithromycin (ZITHROMAX) 500 mg in dextrose 5 % 250 mL IVPB  Status:  Discontinued     500 mg 250 mL/hr over 60 Minutes Intravenous Daily at bedtime 01/17/16 0034 01/17/16 0857      Subjective:    [name] she relates her shortness of breath is improved. No new complaints.  Objective:    Filed Vitals:   01/20/16 0021 01/20/16 0458 01/20/16 0510 01/20/16 0823  BP: 150/57 143/60  154/46  Pulse: 83 80  85  Temp: 98.6 F (37 C) 98.2 F (36.8 C)    TempSrc: Oral Oral      Resp: 23 21  20   Height:      Weight:   42.865 kg (94 lb 8 oz)   SpO2: 99% 100%  100%    Intake/Output Summary (Last 24 hours) at 01/20/16 0841 Last data filed at 01/20/16 0825  Gross per 24 hour  Intake  708.1 ml  Output   2100 ml  Net -1391.9 ml   Filed Weights   01/18/16 0444 01/19/16 0340 01/20/16 0510  Weight: 46.01 kg (101 lb 6.9 oz) 45.03 kg (99 lb 4.4 oz) 42.865 kg (94 lb 8 oz)    Exam: Gen:  Surprisingly she is awake alert and oriented 3 Cardiovascular:  RRR, positive JVD, with trace lower extremity edema. Chest and lungs:   Chest good air movement, no clear to auscultation. Abdomen:  Abdomen soft, NT/ND, + BS Extremities:  trace edema   Data Reviewed:    Labs: Basic Metabolic Panel:  Recent Labs Lab 01/16/16 1915 01/17/16 0600 01/17/16 2323 01/19/16 0453 01/20/16 0400  NA 141 138 136 137 135  K 4.3 3.8 4.0 3.6 3.3*  CL 106 104 101 98* 96*  CO2 22 21* 21* 22 23  GLUCOSE 259* 223* 151* 120* 126*  BUN 95* 93* 99* 109* 111*  CREATININE 4.01* 4.06* 4.26* 4.62* 4.66*  CALCIUM 9.0 8.7* 8.5* 8.5* 8.4*   GFR Estimated Creatinine Clearance: 4.9 mL/min (by C-G formula based on Cr of 4.66). Liver Function Tests: No results for input(s): AST, ALT, ALKPHOS, BILITOT, PROT, ALBUMIN in the last 168 hours. No results for input(s): LIPASE, AMYLASE in the last 168 hours. No results for input(s): AMMONIA in the last 168 hours. Coagulation profile No results for input(s): INR, PROTIME in the last 168 hours.  CBC:  Recent Labs Lab 01/16/16 1915 01/17/16 0600 01/17/16 2323 01/19/16 0453  WBC 8.9 7.3 7.3 6.6  HGB 8.8* 7.9* 7.7* 7.9*  HCT 28.1* 24.8* 24.7* 24.9*  MCV 86.5 84.9 84.6 84.4  PLT 266 227 214 229   Cardiac Enzymes:  Recent Labs Lab 01/16/16 2343 01/17/16 0600 01/17/16 1154 01/17/16 1815  TROPONINI 0.21* 0.70*  0.80* 0.99* 0.99*   BNP (last 3 results) No results for input(s): PROBNP in the last 8760 hours. CBG:  Recent Labs Lab  01/19/16 0148 01/19/16 0715 01/19/16 1101 01/19/16 1623 01/19/16 1931  GLUCAP 118* 130* 193* 168* 149*   D-Dimer: No results for input(s): DDIMER in the last 72 hours. Hgb A1c: No results for input(s): HGBA1C in the last 72 hours. Lipid Profile: No results for input(s): CHOL, HDL, LDLCALC, TRIG, CHOLHDL, LDLDIRECT in the last 72 hours. Thyroid function studies: No results for input(s): TSH, T4TOTAL, T3FREE, THYROIDAB in the last 72 hours.  Invalid input(s): FREET3 Anemia work up: No results for input(s): VITAMINB12, FOLATE, FERRITIN, TIBC, IRON, RETICCTPCT in the last 72 hours. Sepsis Labs:  Recent Labs Lab 01/16/16 1915 01/17/16 0040 01/17/16 0600 01/17/16 2323 01/19/16 0453  PROCALCITON  --  <0.10  --   --  3.53  WBC 8.9  --  7.3 7.3 6.6   Microbiology Recent Results (from the past 240 hour(s))  MRSA PCR Screening     Status: None   Collection Time: 01/16/16 11:23 PM  Result Value Ref Range Status   MRSA by PCR NEGATIVE NEGATIVE Final    Comment:        The GeneXpert MRSA Assay (FDA approved for NASAL specimens only), is one component of a comprehensive MRSA colonization surveillance program. It is not intended to diagnose MRSA infection nor to guide or monitor treatment for MRSA infections.   Culture, blood (routine x 2) Call MD if unable to obtain prior to antibiotics being given     Status: None (Preliminary result)   Collection Time: 01/17/16  2:11 AM  Result Value Ref Range Status   Specimen Description BLOOD LEFT ARM  Final   Special Requests BOTTLES DRAWN AEROBIC AND ANAEROBIC 5CC  Final   Culture NO GROWTH 2 DAYS  Final   Report Status PENDING  Incomplete  Culture, blood (routine x 2) Call MD if unable to obtain prior to antibiotics being given     Status: None (Preliminary result)   Collection Time: 01/17/16  2:12 AM  Result Value Ref Range Status   Specimen Description BLOOD RIGHT HAND  Final   Special Requests BOTTLES DRAWN AEROBIC ONLY 3CC   Final   Culture NO GROWTH 2 DAYS  Final   Report Status PENDING  Incomplete     Medications:   . amLODipine  10 mg Oral Daily  . antiseptic oral rinse  7 mL Mouth Rinse BID  . aspirin EC  81 mg Oral Daily  . atorvastatin  10 mg Oral q1800  . carvedilol  25 mg Oral BID WC  . cloNIDine  0.3 mg Transdermal Weekly  . famotidine  20 mg Oral Daily  . feeding supplement  1 Container Oral BID BM  . furosemide  120 mg Intravenous TID  . hydrALAZINE  100 mg Oral 3 times per day  . insulin aspart  0-5 Units Subcutaneous QHS  . insulin aspart  0-5 Units Subcutaneous QHS  . insulin aspart  0-9 Units Subcutaneous TID WC  . insulin aspart  3 Units Subcutaneous TID WC  . metolazone  2.5 mg Oral Daily  . potassium chloride  40 mEq Oral BID  . sodium chloride flush  3 mL Intravenous Q12H   Continuous Infusions:    Time spent: 25 min   LOS: 3 days   Marinda Elk  [facility] Pager [phone]  *Please refer to [email or web address], password [email or web address] to get updated schedule on who will round on this patient, as hospitalists switch teams weekly. If 7PM-7AM, please contact night-coverage at [email or web address], password [email or web address] for any overnight needs.  01/20/2016, 8:41 AM

## 2016-01-20 NOTE — Progress Notes (Addendum)
SUBJECTIVE:  No complaints  OBJECTIVE:   Vitals:   Filed Vitals:   01/20/16 0021 01/20/16 0458 01/20/16 0510 01/20/16 0823  BP: 150/57 143/60  154/46  Pulse: 83 80  85  Temp: 98.6 F (37 C) 98.2 F (36.8 C)    TempSrc: Oral Oral    Resp: Height:      Weight:   94 lb 8 oz (42.865 kg)   SpO2: 99% 100%  100%   I&O's:   Intake/Output Summary (Last 24 hours) at 01/20/16 0839 Last data filed at 01/20/16 0825  Gross per 24 hour  Intake  708.1 ml  Output   2100 ml  Net -1391.9 ml   TELEMETRY: Reviewed telemetry pt in NSR:     PHYSICAL EXAM General: Well developed, well nourished, in no acute distress Head: Eyes PERRLA, No xanthomas.   Normal cephalic and atramatic  Lungs:  Few crackles at bases Heart:   HRRR S1 S2 Pulses are 2+ & equal. Abdomen: Bowel sounds are positive, abdomen soft and non-tender without masses  Extremities:   No clubbing, cyanosis or edema.  DP +1 Neuro: Alert and oriented X 3. Psych:  Good affect, responds appropriately   LABS: Basic Metabolic Panel:  Recent Labs  16/10/96 0453 01/20/16 0400  NA 137 135  K 3.6 3.3*  CL 98* 96*  CO2 22 23  GLUCOSE 120* 126*  BUN 109* 111*  CREATININE 4.62* 4.66*  CALCIUM 8.5* 8.4*   Liver Function Tests: No results for input(s): AST, ALT, ALKPHOS, BILITOT, PROT, ALBUMIN in the last 72 hours. No results for input(s): LIPASE, AMYLASE in the last 72 hours. CBC:  Recent Labs  01/17/16 2323 01/19/16 0453  WBC 7.3 6.6  HGB 7.7* 7.9*  HCT 24.7* 24.9*  MCV 84.6 84.4  PLT 214 229   Cardiac Enzymes:  Recent Labs  01/17/16 1154 01/17/16 1815  TROPONINI 0.99* 0.99*   BNP: Invalid input(s): POCBNP D-Dimer: No results for input(s): DDIMER in the last 72 hours. Hemoglobin A1C: No results for input(s): HGBA1C in the last 72 hours. Fasting Lipid Panel: No results for input(s): CHOL, HDL, LDLCALC, TRIG, CHOLHDL, LDLDIRECT in the last 72 hours. Thyroid Function Tests: No results for  input(s): TSH, T4TOTAL, T3FREE, THYROIDAB in the last 72 hours.  Invalid input(s): FREET3 Anemia Panel: No results for input(s): VITAMINB12, FOLATE, FERRITIN, TIBC, IRON, RETICCTPCT in the last 72 hours. Coag Panel:   No results found for: INR, PROTIME  RADIOLOGY: Dg Chest 2 View  01/18/2016  CLINICAL DATA:  82 year old who was admitted 2 days ago with CHF and has improving though persistent shortness of breath. EXAM: CHEST  2 VIEW COMPARISON:  01/17/2016 and earlier. FINDINGS: Prior sternotomy for CABG. Cardiac silhouette moderately enlarged. Severe atherosclerosis involving the thoracic and upper abdominal aorta. Interval significant improvement in the interstitial and airspace pulmonary edema, though moderate CHF persists, asymmetric and increased in the right lung. Stable small bilateral pleural effusions. No new pulmonary parenchymal abnormalities. IMPRESSION: Improving CHF, though moderate interstitial and airspace pulmonary edema persist, asymmetric and increased in the right lung. Stable small bilateral pleural effusions. No new abnormalities. Electronically Signed   By: Hulan Saas M.D.   On: 01/18/2016 17:19   Dg Chest 2 View  01/17/2016  CLINICAL DATA:  Dyspnea for 1 day EXAM: CHEST  2 VIEW COMPARISON:  01/17/2016 with the patient is status post CABG with extensive aortic calcification. Mild to moderate cardiac silhouette enlargement is stable. Central venous congestion  is also FINDINGS: Unchanged. There is patchy multifocal bilateral airspace opacification. Airspace opacities are predominantly alveolar with resolving interstitial opacities. Small bilateral pleural effusions are present. IMPRESSION: Findings appear most consistent with evolving pulmonary edema with mild improvement. Radiographic follow-up suggested to monitor, as the possibility of infectious infiltrate superimposed on pulmonary edema is not excluded. Electronically Signed   By: Esperanza Heir M.D.   On: 01/17/2016 12:06     Dg Chest Port 1 View  01/17/2016  CLINICAL DATA:  82 year old female with chest pain and shortness of breath EXAM: PORTABLE CHEST 1 VIEW COMPARISON:  Radiograph dated 01/16/2016 FINDINGS: There is diffuse interstitial prominence with bilateral mid to lower lung field interstitial and airspace hazy densities. Overall there has been interval progression of the airspace densities since the prior study most compatible with pneumonia. There is no significant pleural effusion. No pneumothorax. The cardiac borders are silhouetted. Median sternotomy wires and CABG vascular clips noted. The aorta is tortuous. There is atherosclerotic calcification of the visualized aorta. No acute osseous pathology identified. IMPRESSION: Interval progression of the bilateral airspace opacities. Follow-up recommended. Electronically Signed   By: Elgie Collard M.D.   On: 01/17/2016 00:58   Dg Chest Port 1 View  01/16/2016  CLINICAL DATA:  82 year old female with shortness of breath EXAM: PORTABLE CHEST 1 VIEW COMPARISON:  Radiograph dated 07/12/2015 FINDINGS: Single portable view of the chest demonstrate emphysematous changes of the lungs. There is diffuse interstitial prominence with areas of hazy and nodular airspace opacity predominantly involving the mid to lower lung fields. Findings most compatible with worsening interstitial edema with possible superimposed infection. Clinical correlation recommended. No significant pleural effusion identified. There is no pneumothorax. Stable cardiac silhouette. Median sternotomy wires and CABG vascular clips noted. There is osteopenia with degenerative changes of the spine. No acute fracture. IMPRESSION: Interval worsening of the pulmonary edema with possible superimposed infection. Clinical correlation is recommended. Electronically Signed   By: Elgie Collard M.D.   On: 01/16/2016 19:53    ASSESSMENT/PLAN: 82 yo F w a h/o CAD s/p CABG 2002, HFpEF, PVD s/p AKA, DM with nephropathy,  HTN, HLD, Anemia, & CKD (baseline Cr 3.2) p/w dyspnea & pain under her left rib that started shortly after wakening.   # Dyspnea & rib pain with troponin elevation in the setting of known CAD - Her dyspnea & troponin elevation with flat trend are most likely related to HFpEF, hypertensive urgency, & AKI on CKD. Her rib pain was in an unusual location to be ischemic in etiology & was more likely related to abdominal swelling.Dr. Antoine Poche had previously attempted Imdur to address angina, but this paradoxically worsened her symptoms.  - Continue on maximal Carvedilol and Amlodipine. - Should pain recur, could consider Ranolazine as an alternative anti-anginal agent.  - Family want conservative management given patient's advanced age.  # Hypertension - As per above, this was elevated on presentation. Notably, BP improved with initiation of home medications but still on the high side.  - We will continue her Carvedilol 25 mg PO BID, & Hydralazine 100 mg PO TID for now. - Given concerns regarding compliance and still inadequate BP control, changed Clonidine patch and increased to 0.3mg .   # AKI - This has worsened and most likely due to worsening of intrinsic kidney disease.  Creatinine seems to have stabilized some with increased diuresis so may see some improvement with more diuresis.    # Hyperlipidemia - No transaminitis. LDL at goal.  - We have resumed her  home Atorvastatin.  # Acute on chronic diastolic CHF - She put out 2.1L yesterday and is -2.1L net neg.  2D echo with low normal LVF EF 50-55%.Continue lasix 120mg  TID.Anemia may be contributing to CHF.   # Anemia - chronic disease related to underlying CKD.   # Hypokalemia - replete per hospitalist  Continue diuretics.  She seems to have responded better to the higher dose of diuretic.  Nothing more to add at this time.  Await Palliative Care consult - family meeting this afternoon.  Quintella ReichertURNER,TRACI R, MD  01/20/2016    8:39 AM

## 2016-01-20 NOTE — Consult Note (Signed)
Consultation Note Date: 01/20/2016   Patient Name: Adrienne Bowman  DOB: 05-03-1918  MRN: [id number]  Age / Sex: 82 y.o., female  PCP: Adrienne Dun, MD Referring Physician: Marinda Elk, MD  Reason for Consultation: Establishing goals of care  82 yo with CHF, chronic angina and chronic kidney disease (base line creatinine 3.2) who presented to the hospital on 3/4 with chest pain and dyspnea with volume overload secondary to heart failure exacerbation.  She has been very carefully diuresed as she also has acute on chronic kidney failure.  She currently remains on oxygen but is feeling much improved.  Clinical Assessment/Narrative:  Adrienne Bowman is a delightful 82 yr old female who lives at home independently.  Her daughter (Adrienne Bowman) lives near by and checks on her daily.  Ms.  Bowman and her family are very clear about her desire for a natural death.  Unfortunately the patient lost a daughter.  The daughter had been intubated.  This personal experience with "life support" profoundly affected Adrienne Bowman. She never wants to be "hooked up to those machines". She stated repeatedly when it's my time I just want to go.  We discussed the difference between and aggressive care path and a palliative path.  Adrienne Bowman was inclined towards palliative.  I brought up the option of not returning to the hospital.  Adrienne Bowman opted to leave the decision about whether or not to return to the hospital with a future illness up to her daughters.  We discussed hospital discharge.  Per Physical Therapy note the patient needs 24 hour care.  The daughters are unable to provide that at home but are very concerned for their mother and want her to have 24 hour care.  The plan is to discharge to a SNF for Rehab.  Then at the end of the rehab period the family will likely take advantage of hospice services either in the patient's home or or a long term care  facility.  The family likes the idea of a hospice home for end of life and they are familiar with [facility], consequently they would like to contact [facility] at the appropriate time.   Contacts/Participants in Discussion: Adrienne Bowman, her 3 daughters and her grand daughter Primary Decision Maker: the patient HCPOA:  The patient has no formal POA but relies on the assistance of her 3 daughters, particularly, [place].   SUMMARY OF RECOMMENDATIONS  Code Status/Advance Care Planning: DNR   Symptom Management:   Per primary team.  Additional Recommendations (Limitations, Scope, Preferences):  Patient does not want heroic measures or invasive procedures.  They would like to pursue using hospice of Advance services once she has completed rehab at [facility].  Psycho-social/Spiritual:  Support System: Strong   Prognosis: Less than 6 months.  Discharge Planning: Skilled Nursing Facility for rehab with Palliative care service follow-up   Chief Complaint/ Primary Diagnoses: Present on Admission:  . CAD (coronary artery disease) . Acute on chronic diastolic CHF (congestive heart failure), NYHA class 1 (HCC) . Elevated troponin . Essential hypertension . Peripheral vascular disease, unspecified (HCC) . Acute on chronic renal failure (HCC) . Acute on chronic diastolic congestive heart failure (HCC) . Abnormal chest x-ray . Acute on chronic diastolic (congestive) heart failure (HCC)  I have reviewed the medical record, interviewed the patient and family, and examined the patient. The following aspects are pertinent.  Past Medical History  Diagnosis Date  . CHF (congestive heart failure) (HCC) 2002  acute CHF while getting lower extremilty arteriograms /  Led to CABG  x6  . Hypertension     very difficult to control  . PVD (peripheral vascular disease) (HCC)     extensive PVD / left AKA  . Murmur, heart     soft systolic outflow murmur and a grade 2/6 murmur  of the aortic insufficiency  . S/P AKA (above knee amputation) (HCC)     left AKA with prosthesis  . DIABETES MELLITUS, TYPE II   . NEPHROPATHY, DIABETIC   . Coronary artery disease 2002    CABG x6 / left internal mammary artery graft to the LAD, a saphenous vein graft to the diagonal, a sequential saphenous vein graft to the 1st and 2nd obtuse marginal branches, and a sequential saphenous vein graft the the distal right coronary artery and posterior descending  . Anemia   . CKD (chronic kidney disease)   . Advanced age    Social History   Social History  . Marital Status: Widowed    Spouse Name: N/A  . Number of Children: N/A  . Years of Education: N/A   Social History Main Topics  . Smoking status: Never Smoker   . Smokeless tobacco: Never Used  . Alcohol Use: No  . Drug Use: No  . Sexual Activity: No   Other Topics Concern  . None   Social History Narrative   Family History  Problem Relation Age of Onset  . Coronary artery disease      prevalent in sibling  . Heart disease Mother   . Hypertension Daughter   . Cancer Son     Lung  and  Throat  . Heart attack Son   . Hyperlipidemia Daughter   . Hypertension Daughter   . Hypertension Daughter   . Hypertension Daughter    Scheduled Meds: . amLODipine  10 mg Oral Daily  . antiseptic oral rinse  7 mL Mouth Rinse BID  . aspirin EC  81 mg Oral Daily  . atorvastatin  10 mg Oral q1800  . carvedilol  25 mg Oral BID WC  . cloNIDine  0.3 mg Transdermal Weekly  . famotidine  20 mg Oral Daily  . feeding supplement  1 Container Oral BID BM  . furosemide  120 mg Intravenous TID  . hydrALAZINE  100 mg Oral 3 times per day  . insulin aspart  0-5 Units Subcutaneous QHS  . insulin aspart  0-5 Units Subcutaneous QHS  . insulin aspart  0-9 Units Subcutaneous TID WC  . insulin aspart  3 Units Subcutaneous TID WC  . metolazone  2.5 mg Oral Daily  . sodium chloride flush  3 mL Intravenous Q12H   Continuous Infusions:  PRN  Meds:.sodium chloride, acetaminophen, acetaminophen, ALPRAZolam, levalbuterol, morphine injection, ondansetron (ZOFRAN) IV, sodium chloride flush Medications Prior to Admission:  Prior to Admission medications   Medication Sig Start Date End Date Taking? Authorizing Provider  amlodipine-atorvastatin (CADUET) 10-10 MG tablet TAKE ONE TABLET BY MOUTH ONCE DAILY 11/23/15  Yes Rollene Rotunda, MD  aspirin EC 81 MG tablet Take 81 mg by mouth daily.   Yes Historical Provider, MD  carvedilol (COREG) 25 MG tablet Take 1 tablet (25 mg total) by mouth 2 (two) times daily with a meal. 08/20/15  Yes Dwana Melena, PA-C  cloNIDine (CATAPRES) 0.2 MG tablet TAKE ONE TABLET BY MOUTH TWICE DAILY 09/14/15  Yes Adrienne Dun, MD  famotidine (PEPCID) 20 MG tablet TAKE ONE TABLET BY MOUTH ONCE  DAILY 12/07/15  Yes Adrienne Dun, MD  furosemide (LASIX) 40 MG tablet TAKE ONE TABLET BY MOUTH ONCE DAILY 12/07/15  Yes Adrienne Dun, MD  hydrALAZINE (APRESOLINE) 100 MG tablet TAKE ONE TABLET BY MOUTH THREE TIMES DAILY 12/07/15  Yes Rollene Rotunda, MD  multivitamin [facility]) per tablet Take 1 tablet by mouth daily.     Yes Historical Provider, MD  nitroGLYCERIN (NITROSTAT) 0.4 MG SL tablet Place 1 tablet (0.4 mg total) under the tongue every 5 (five) minutes as needed for chest pain. 07/13/15  Yes Renae Fickle, MD  GLUCERNA (GLUCERNA) LIQD Take 237 mLs by mouth 2 (two) times daily between meals. Patient not taking: Reported on 01/17/2016 07/13/15   Renae Fickle, MD   No Known Allergies  Review of Systems  Constitutional: Positive for activity change, appetite change and unexpected weight change.  HENT: Positive for hearing loss.   Eyes: Negative.   Respiratory: Positive for chest tightness and shortness of breath.   Cardiovascular: Positive for chest pain and leg swelling.  Gastrointestinal: Positive for abdominal distention.  Endocrine: Negative.   Genitourinary: Positive for dysuria.  Musculoskeletal: Negative.   Skin:  Negative.   Allergic/Immunologic: Negative.   Neurological: Positive for weakness and light-headedness.  Hematological: Negative.   Psychiatric/Behavioral: Negative.     Physical Exam Elderly frail cachetic female currently sitting up in the chair A&O, actively participating in the conversation CV:  Regular Resp:  N/C in place at 2L 97% Abdomen:  Nt, Nd Extremities:  LLE amputation / prosthesis, RLE without edema.     Vital Signs: BP 127/46 mmHg  Pulse 85  Temp(Src) 98.2 F (36.8 C) (Oral)  Resp 20  Ht  (1.626 m)  Wt 42.865 kg (94 lb 8 oz)  BMI 16.21 kg/m2  SpO2 100%  SpO2: SpO2: 100 % O2 Device:SpO2: 100 % O2 Flow Rate: .O2 Flow Rate (L/min): 2 L/min  IO: Intake/output summary:   Intake/Output Summary (Last 24 hours) at 01/20/16 1736 Last data filed at 01/20/16 1303  Gross per 24 hour  Intake    443 ml  Output   2000 ml  Net  -1557 ml    LBM: Last BM Date: 01/18/16 Baseline Weight: Weight: 46.267 kg (102 lb) Most recent weight: Weight: 42.865 kg (94 lb 8 oz)      Palliative Assessment/Data:  Flowsheet Rows        Most Recent Value   Intake Tab    Referral Department  Hospitalist   Unit at Time of Referral  Cardiac/Telemetry Unit   Palliative Care Primary Diagnosis  Cardiac   Date Notified  01/19/16   Palliative Care Type  New Palliative care   Reason for referral  Clarify Goals of Care   Date of Admission  01/16/16   Date first seen by Palliative Care  01/20/16   # of days Palliative referral response time  1 Day(s)   # of days IP prior to Palliative referral  3   Clinical Assessment    Palliative Performance Scale Score  50%   Psychosocial & Spiritual Assessment    Palliative Care Outcomes    Patient/Family meeting held?  Yes   Who was at the meeting?  Patient, 3 daughters, and her grand daughter   Palliative Care Outcomes  Clarified goals of care, Counseled regarding hospice      Additional Data Reviewed:  CBC:    Component Value  Date/Time   WBC 6.6 01/19/2016 0453   HGB 7.9* 01/19/2016 0453  HCT 24.9* 01/19/2016 0453   PLT 229 01/19/2016 0453   MCV 84.4 01/19/2016 0453   NEUTROABS 4.3 05/30/2011 1010   LYMPHSABS 1.3 05/30/2011 1010   MONOABS 0.6 05/30/2011 1010   EOSABS 0.1 05/30/2011 1010   BASOSABS 0.0 05/30/2011 1010   Comprehensive Metabolic Panel:    Component Value Date/Time   NA 135 01/20/2016 0400   K 3.3* 01/20/2016 0400   CL 96* 01/20/2016 0400   CO2 23 01/20/2016 0400   BUN 111* 01/20/2016 0400   CREATININE 4.66* 01/20/2016 0400   CREATININE 2.13* 04/27/2013 0714   GLUCOSE 126* 01/20/2016 0400   CALCIUM 8.4* 01/20/2016 0400   AST 22 12/18/2014 0904   ALT 18 12/18/2014 0904   ALKPHOS 64 12/18/2014 0904   BILITOT 0.4 12/18/2014 0904   PROT 7.0 12/18/2014 0904   ALBUMIN 3.8 12/18/2014 0904     Time In: 3:00 pm Time Out: 4:10 pm Time Total: 70 min Greater than 50%  of this time was spent counseling and coordinating care related to the above assessment and plan.  Signed by: Algis DownsMarianne York, PA-C Palliative Medicine Pager: [phone]  01/20/2016, 5:36 PM  Please contact Palliative Medicine Team phone at [phone] for questions and concerns.

## 2016-01-21 DIAGNOSIS — R938 Abnormal findings on diagnostic imaging of other specified body structures: Secondary | ICD-10-CM

## 2016-01-21 LAB — GLUCOSE, CAPILLARY
GLUCOSE-CAPILLARY: 269 mg/dL — AB (ref 65–99)
GLUCOSE-CAPILLARY: 254 mg/dL — AB (ref 65–99)
Glucose-Capillary: 199 mg/dL — ABNORMAL HIGH (ref 65–99)
Glucose-Capillary: 117 mg/dL — ABNORMAL HIGH (ref 65–99)

## 2016-01-21 LAB — BASIC METABOLIC PANEL
ANION GAP: 17 — AB (ref 5–15)
BUN: 117 mg/dL — ABNORMAL HIGH (ref 6–20)
CHLORIDE: 94 mmol/L — AB (ref 101–111)
CO2: 21 mmol/L — AB (ref 22–32)
Calcium: 8.2 mg/dL — ABNORMAL LOW (ref 8.9–10.3)
Creatinine, Ser: 4.85 mg/dL — ABNORMAL HIGH (ref 0.44–1.00)
GFR calc Af Amer: 8 mL/min — ABNORMAL LOW (ref >60)
GFR calc non Af Amer: 7 mL/min — ABNORMAL LOW (ref >60)
Glucose, Bld: 162 mg/dL — ABNORMAL HIGH (ref 65–99)
Potassium: 4 mmol/L (ref 3.5–5.1)
Sodium: 132 mmol/L — ABNORMAL LOW (ref 135–145)

## 2016-01-21 LAB — PROCALCITONIN: Procalcitonin: 1.6 ng/mL

## 2016-01-21 MED ORDER — HEPARIN SODIUM (PORCINE) 5000 UNIT/ML IJ SOLN
5000.0000 [IU] | Freq: Three times a day (TID) | INTRAMUSCULAR | Status: DC
  Administered 2016-01-21 – 2016-01-23 (×6): 5000 [IU] via SUBCUTANEOUS
  Filled 2016-01-21 (×7): qty 1

## 2016-01-21 NOTE — Clinical Social Work Placement (Signed)
   CLINICAL SOCIAL WORK PLACEMENT  NOTE  Date:  01/21/2016  Patient Details  Name: Urbano HeirJessie B Kratky MRN: [id number] Date of Birth: 1918-02-14  Clinical Social Work is seeking post-discharge placement for this patient at the Skilled  Nursing Facility level of care (*CSW will initial, date and re-position this form in  chart as items are completed):  Yes   Patient/family provided with [facility] Clinical Social Work Department's list of facilities offering this level of care within the geographic area requested by the patient (or if unable, by the patient's family).  Yes   Patient/family informed of their freedom to choose among providers that offer the needed level of care, that participate in Medicare, Medicaid or managed care program needed by the patient, have an available bed and are willing to accept the patient.  Yes   Patient/family informed of [facility] ownership interest [facility] and [facility], as well as of the fact that they are under no obligation to receive care at these facilities.  PASRR submitted to EDS on 01/21/16     PASRR number received on 01/21/16     Existing PASRR number confirmed on       FL2 transmitted to all facilities in geographic area requested by pt/family on 01/20/16     FL2 transmitted to all facilities within larger geographic area on       Patient informed that his/her managed care company has contracts with or will negotiate with certain facilities, including the following:        Yes   Patient/family informed of bed offers received.  [facility]     Physician recommends and patient chooses bed at      Patient to be transferred to   on  .  Patient to be transferred to facility by       Patient family notified on   of transfer.  Name of family member notified:        PHYSICIAN Please prepare priority discharge summary, including medications, Please prepare prescriptions, Please sign FL2, Please  sign DNR     Additional Comment:    _______________________________________________ Venita Lickampbell, Beuford Garcilazo B, LCSW 01/21/2016, 2:47 PM

## 2016-01-21 NOTE — Progress Notes (Signed)
SUBJECTIVE:  No complaints  OBJECTIVE:   Vitals:   Filed Vitals:   01/20/16 1903 01/21/16 0334 01/21/16 0511 01/21/16 0755  BP: 133/54 98/39 126/44 140/80  Pulse: 80 79 78 81  Temp: 98.2 F (36.8 C) 98.1 F (36.7 C)  97.8 F (36.6 C)  TempSrc: Oral Oral  Oral  Resp: Height:   (1.626 m)    Weight:  94 lb 4.8 oz (42.774 kg)    SpO2: 99% 100% 100% 100%   I&O's:   Intake/Output Summary (Last 24 hours) at 01/21/16 0942 Last data filed at 01/21/16 0857  Gross per 24 hour  Intake    443 ml  Output   1200 ml  Net   -757 ml   TELEMETRY: Reviewed telemetry pt in NSR:     PHYSICAL EXAM General: Well developed, well nourished, in no acute distress Head: Eyes PERRLA, No xanthomas.   Normal cephalic and atramatic  Lungs:   Clear bilaterally to auscultation and percussion. Heart:   HRRR S1 S2 Pulses are 2+ & equal. Abdomen: Bowel sounds are positive, abdomen soft and non-tender without masses Extremities:   No clubbing, cyanosis or edema.  DP +1 Neuro: Alert and oriented X 3. Psych:  Good affect, responds appropriately   LABS: Basic Metabolic Panel:  Recent Labs  16/10/96 0400 01/21/16 0415  NA 135 132*  K 3.3* 4.0  CL 96* 94*  CO2 23 21*  GLUCOSE 126* 162*  BUN 111* 117*  CREATININE 4.66* 4.85*  CALCIUM 8.4* 8.2*   Liver Function Tests: No results for input(s): AST, ALT, ALKPHOS, BILITOT, PROT, ALBUMIN in the last 72 hours. No results for input(s): LIPASE, AMYLASE in the last 72 hours. CBC:  Recent Labs  01/19/16 0453  WBC 6.6  HGB 7.9*  HCT 24.9*  MCV 84.4  PLT 229   Cardiac Enzymes: No results for input(s): CKTOTAL, CKMB, CKMBINDEX, TROPONINI in the last 72 hours. BNP: Invalid input(s): POCBNP D-Dimer: No results for input(s): DDIMER in the last 72 hours. Hemoglobin A1C: No results for input(s): HGBA1C in the last 72 hours. Fasting Lipid Panel: No results for input(s): CHOL, HDL, LDLCALC, TRIG, CHOLHDL, LDLDIRECT in the last  72 hours. Thyroid Function Tests: No results for input(s): TSH, T4TOTAL, T3FREE, THYROIDAB in the last 72 hours.  Invalid input(s): FREET3 Anemia Panel: No results for input(s): VITAMINB12, FOLATE, FERRITIN, TIBC, IRON, RETICCTPCT in the last 72 hours. Coag Panel:   No results found for: INR, PROTIME  RADIOLOGY: Dg Chest 2 View  01/18/2016  CLINICAL DATA:  82 year old who was admitted 2 days ago with CHF and has improving though persistent shortness of breath. EXAM: CHEST  2 VIEW COMPARISON:  01/17/2016 and earlier. FINDINGS: Prior sternotomy for CABG. Cardiac silhouette moderately enlarged. Severe atherosclerosis involving the thoracic and upper abdominal aorta. Interval significant improvement in the interstitial and airspace pulmonary edema, though moderate CHF persists, asymmetric and increased in the right lung. Stable small bilateral pleural effusions. No new pulmonary parenchymal abnormalities. IMPRESSION: Improving CHF, though moderate interstitial and airspace pulmonary edema persist, asymmetric and increased in the right lung. Stable small bilateral pleural effusions. No new abnormalities. Electronically Signed   By: Hulan Saas M.D.   On: 01/18/2016 17:19   Dg Chest 2 View  01/17/2016  CLINICAL DATA:  Dyspnea for 1 day EXAM: CHEST  2 VIEW COMPARISON:  01/17/2016 with the patient is status post CABG with extensive aortic calcification. Mild to moderate cardiac silhouette enlargement is  stable. Central venous congestion is also FINDINGS: Unchanged. There is patchy multifocal bilateral airspace opacification. Airspace opacities are predominantly alveolar with resolving interstitial opacities. Small bilateral pleural effusions are present. IMPRESSION: Findings appear most consistent with evolving pulmonary edema with mild improvement. Radiographic follow-up suggested to monitor, as the possibility of infectious infiltrate superimposed on pulmonary edema is not excluded. Electronically Signed    By: Esperanza Heir M.D.   On: 01/17/2016 12:06   Dg Chest Port 1 View  01/17/2016  CLINICAL DATA:  82 year old female with chest pain and shortness of breath EXAM: PORTABLE CHEST 1 VIEW COMPARISON:  Radiograph dated 01/16/2016 FINDINGS: There is diffuse interstitial prominence with bilateral mid to lower lung field interstitial and airspace hazy densities. Overall there has been interval progression of the airspace densities since the prior study most compatible with pneumonia. There is no significant pleural effusion. No pneumothorax. The cardiac borders are silhouetted. Median sternotomy wires and CABG vascular clips noted. The aorta is tortuous. There is atherosclerotic calcification of the visualized aorta. No acute osseous pathology identified. IMPRESSION: Interval progression of the bilateral airspace opacities. Follow-up recommended. Electronically Signed   By: Elgie Collard M.D.   On: 01/17/2016 00:58   Dg Chest Port 1 View  01/16/2016  CLINICAL DATA:  82 year old female with shortness of breath EXAM: PORTABLE CHEST 1 VIEW COMPARISON:  Radiograph dated 07/12/2015 FINDINGS: Single portable view of the chest demonstrate emphysematous changes of the lungs. There is diffuse interstitial prominence with areas of hazy and nodular airspace opacity predominantly involving the mid to lower lung fields. Findings most compatible with worsening interstitial edema with possible superimposed infection. Clinical correlation recommended. No significant pleural effusion identified. There is no pneumothorax. Stable cardiac silhouette. Median sternotomy wires and CABG vascular clips noted. There is osteopenia with degenerative changes of the spine. No acute fracture. IMPRESSION: Interval worsening of the pulmonary edema with possible superimposed infection. Clinical correlation is recommended. Electronically Signed   By: Elgie Collard M.D.   On: 01/16/2016 19:53    ASSESSMENT/PLAN: 82 yo F w a h/o CAD s/p CABG  2002, HFpEF, PVD s/p AKA, DM with nephropathy, HTN, HLD, Anemia, & CKD (baseline Cr 3.2) p/w dyspnea & pain under her left rib that started shortly after wakening.   # Dyspnea & rib pain with troponin elevation in the setting of known CAD - Her dyspnea & troponin elevation with flat trend are most likely related to HFpEF, hypertensive urgency, & AKI on CKD. Her rib pain was in an unusual location to be ischemic in etiology & was more likely related to abdominal swelling.Dr. Antoine Poche had previously attempted Imdur to address angina, but this paradoxically worsened her symptoms.  - Continue on maximal Carvedilol and Amlodipine. - Should pain recur, could consider Ranolazine as an alternative anti-anginal agent.  - Family want conservative management given patient's advanced age.  # Hypertension - As per above, this was elevated on presentation. Notably, BP improved with initiation of home medications.  - We will continue her Carvedilol 25 mg PO BID, & Hydralazine 100 mg PO TID . - Given concerns regarding compliance and still inadequate BP control, changed Clonidine patch and increased to 0.3mg .   # AKI - This has worsened and most likely due to worsening of intrinsic kidney disease. Creatinine continues to climb.     # Hyperlipidemia - No transaminitis. LDL at goal.  - We have resumed her home Atorvastatin.  # Acute on chronic diastolic CHF - She put out 1.2L yesterday and is -  3L net neg. 2D echo with low normal LVF EF 50-55%.Continue lasix 120mg  TID.Anemia may be contributing to CHF.   # Anemia - chronic disease related to underlying CKD.   # Hypokalemia - replete per hospitalist  Continue diuretics. Nothing more to add at this time. Going to Palliative Care after rehab.  Will sign off.  Call with any questions.   Quintella ReichertURNER,[place] R, MD  01/21/2016  9:42 AM

## 2016-01-21 NOTE — Progress Notes (Signed)
[facility]                                                                              Patient Demographics  Desmond LopeJessie Orlov, is a 82 y.o. female, DOB - 09/15/1918, ZOX:[id number]  Admit date - 01/16/2016   Admitting Physician Therisa DoyneAnastassia Doutova, MD  Outpatient Primary MD for the patient is [name], MD  LOS - 4   Chief Complaint  Patient presents with  . Shortness of Breath  . Chest Pain       Brief HPI   Urbano HeirJessie B Heck is an 82 y.o. female past medical history chronic systolic heart failure with an EF of 45%, no presents to the ED with dyspnea and left-sided chest pain that started the morning of admission   Assessment & Plan    Acute on chronic diastolic CHF (congestive heart failure), NYHA class 1 (HCC)/ Elevated troponin/ Acute on chronic renal failure (HCC) - Patient on aggressive IV Lasix and metolazone, as she seems to be having poor response to diuretic therapy.  - Negative balance of 3.2L - Palliative care  following, recommendations pending - Per cardiology, continue Coreg, amlodipine, showed chest pain recur could consider Ranexa - 2-D echo showed EF of 50-55%, grade 1 diastolic dysfunction  Diabetes Mellitus controlled HbA1c: A1c 7.1, cont. sliding scale insulin.  Essential hypertension: Was elevated on admission - Improved, continue Coreg, hydralazine, amlodipine, Lasix, clonidine - Clonidine increased by cardiology  Peripheral vascular disease, unspecified (HCC)/ S/P AKA (above knee amputation) unilateral (HCC) Continue current home regimen.  Acute kidney injury on chronic kidney disease stage 3/4 - Creatinine continues to trend up, hold metolazone - Transitioned to oral Lasix in a.m. 120mg  TID - Await further recommendations from palliative medicine regarding goals of care   Hyperlipidemia: Cont stains.  Code Status: DNR  Family Communication: Discussed in detail with the patient, all imaging results, lab results explained to  the patient   Disposition Plan:  Likely DC in 24hrs after palliative meeting and snf available  Time Spent in minutes  25 minutes  Procedures  echo  Consults   cardiology  DVT Prophylaxis  heparin subcutaneous  Medications  Scheduled Meds: . amLODipine  10 mg Oral Daily  . antiseptic oral rinse  7 mL Mouth Rinse BID  . aspirin EC  81 mg Oral Daily  . atorvastatin  10 mg Oral q1800  . carvedilol  25 mg Oral BID WC  . cloNIDine  0.3 mg Transdermal Weekly  . famotidine  20 mg Oral Daily  . feeding supplement  1 Container Oral BID BM  . furosemide  120 mg Intravenous TID  . hydrALAZINE  100 mg Oral 3 times per day  . insulin aspart  0-5 Units Subcutaneous QHS  . insulin aspart  0-9 Units Subcutaneous TID WC  . insulin aspart  3 Units Subcutaneous TID WC  . metolazone  2.5 mg Oral Daily  . sodium chloride flush  3 mL Intravenous Q12H   Continuous Infusions:  PRN Meds:.sodium chloride, acetaminophen, acetaminophen, ALPRAZolam, levalbuterol, morphine injection, ondansetron (ZOFRAN) IV, sodium chloride flush   Antibiotics   Anti-infectives    Start  Dose/Rate Route Frequency Ordered Stop   01/17/16 0130  cefTRIAXone (ROCEPHIN) 1 g in dextrose 5 % 50 mL IVPB  Status:  Discontinued     1 g 100 mL/hr over 30 Minutes Intravenous Daily at bedtime 01/17/16 0034 01/17/16 0857   01/17/16 0130  azithromycin (ZITHROMAX) 500 mg in dextrose 5 % 250 mL IVPB  Status:  Discontinued     500 mg 250 mL/hr over 60 Minutes Intravenous Daily at bedtime 01/17/16 0034 01/17/16 0857        Subjective:   Reyes Fifield was seen and examined today. Patient denies dizziness, abdominal pain, N/V/D/C, new weakness, numbess, tingling. No acute events overnight.  No chest pain at the time of my encounter, shortness of breath is improving.  Objective:   Filed Vitals:   01/21/16 0334 01/21/16 0511 01/21/16 0755 01/21/16 0952  BP: 98/39 126/44 140/80 138/44  Pulse: 79 78 81   Temp: 98.1 F (36.7  C)  97.8 F (36.6 C)   TempSrc: Oral  Oral   Resp: 16 16 20    Height: 5\' 4"  (1.626 m)     Weight: 42.774 kg (94 lb 4.8 oz)     SpO2: 100% 100% 100%     Intake/Output Summary (Last 24 hours) at 01/21/16 1156 Last data filed at 01/21/16 0959  Gross per 24 hour  Intake    440 ml  Output   1000 ml  Net   -560 ml     Wt Readings from Last 3 Encounters:  01/21/16 42.774 kg (94 lb 4.8 oz)  11/26/15 46.324 kg (102 lb 2 oz)  08/20/15 48.807 kg (107 lb 9.6 oz)     Exam  General: Alert and oriented  NAD  HEENT:  PERRLA, EOMI, Anicteric Sclera, mucous membranes moist.   Neck: Supple, no JVD, no masses  CVS: S1 S2 auscultated, no rubs, murmurs or gallops. Regular rate and rhythm.  Respiratory: Clear to auscultation bilaterally, no wheezing, rales or rhonchi  Abdomen: Soft, nontender, nondistended, + bowel sounds  Ext: no cyanosis clubbing, trace edema  Neuro: no new deficits  Skin: No rashes  Psych: Normal affect and demeanor, alert and oriented   Data Review   Micro Results Recent Results (from the past 240 hour(s))  MRSA PCR Screening     Status: None   Collection Time: 01/16/16 11:23 PM  Result Value Ref Range Status   MRSA by PCR NEGATIVE NEGATIVE Final    Comment:        The GeneXpert MRSA Assay (FDA approved for NASAL specimens only), is one component of a comprehensive MRSA colonization surveillance program. It is not intended to diagnose MRSA infection nor to guide or monitor treatment for MRSA infections.   Culture, blood (routine x 2) Call MD if unable to obtain prior to antibiotics being given     Status: None (Preliminary result)   Collection Time: 01/17/16  2:11 AM  Result Value Ref Range Status   Specimen Description BLOOD LEFT ARM  Final   Special Requests BOTTLES DRAWN AEROBIC AND ANAEROBIC 5CC  Final   Culture NO GROWTH 3 DAYS  Final   Report Status PENDING  Incomplete  Culture, blood (routine x 2) Call MD if unable to obtain prior to  antibiotics being given     Status: None (Preliminary result)   Collection Time: 01/17/16  2:12 AM  Result Value Ref Range Status   Specimen Description BLOOD RIGHT HAND  Final   Special Requests BOTTLES DRAWN AEROBIC ONLY 3CC  Final   Culture NO GROWTH 3 DAYS  Final   Report Status PENDING  Incomplete    Radiology Reports Dg Chest 2 View  01/18/2016  CLINICAL DATA:  82 year old who was admitted 2 days ago with CHF and has improving though persistent shortness of breath. EXAM: CHEST  2 VIEW COMPARISON:  01/17/2016 and earlier. FINDINGS: Prior sternotomy for CABG. Cardiac silhouette moderately enlarged. Severe atherosclerosis involving the thoracic and upper abdominal aorta. Interval significant improvement in the interstitial and airspace pulmonary edema, though moderate CHF persists, asymmetric and increased in the right lung. Stable small bilateral pleural effusions. No new pulmonary parenchymal abnormalities. IMPRESSION: Improving CHF, though moderate interstitial and airspace pulmonary edema persist, asymmetric and increased in the right lung. Stable small bilateral pleural effusions. No new abnormalities. Electronically Signed   By: Hulan Saas M.D.   On: 01/18/2016 17:19   Dg Chest 2 View  01/17/2016  CLINICAL DATA:  Dyspnea for 1 day EXAM: CHEST  2 VIEW COMPARISON:  01/17/2016 with the patient is status post CABG with extensive aortic calcification. Mild to moderate cardiac silhouette enlargement is stable. Central venous congestion is also FINDINGS: Unchanged. There is patchy multifocal bilateral airspace opacification. Airspace opacities are predominantly alveolar with resolving interstitial opacities. Small bilateral pleural effusions are present. IMPRESSION: Findings appear most consistent with evolving pulmonary edema with mild improvement. Radiographic follow-up suggested to monitor, as the possibility of infectious infiltrate superimposed on pulmonary edema is not excluded.  Electronically Signed   By: Esperanza Heir M.D.   On: 01/17/2016 12:06   Dg Chest Port 1 View  01/17/2016  CLINICAL DATA:  82 year old female with chest pain and shortness of breath EXAM: PORTABLE CHEST 1 VIEW COMPARISON:  Radiograph dated 01/16/2016 FINDINGS: There is diffuse interstitial prominence with bilateral mid to lower lung field interstitial and airspace hazy densities. Overall there has been interval progression of the airspace densities since the prior study most compatible with pneumonia. There is no significant pleural effusion. No pneumothorax. The cardiac borders are silhouetted. Median sternotomy wires and CABG vascular clips noted. The aorta is tortuous. There is atherosclerotic calcification of the visualized aorta. No acute osseous pathology identified. IMPRESSION: Interval progression of the bilateral airspace opacities. Follow-up recommended. Electronically Signed   By: Elgie Collard M.D.   On: 01/17/2016 00:58   Dg Chest Port 1 View  01/16/2016  CLINICAL DATA:  82 year old female with shortness of breath EXAM: PORTABLE CHEST 1 VIEW COMPARISON:  Radiograph dated 07/12/2015 FINDINGS: Single portable view of the chest demonstrate emphysematous changes of the lungs. There is diffuse interstitial prominence with areas of hazy and nodular airspace opacity predominantly involving the mid to lower lung fields. Findings most compatible with worsening interstitial edema with possible superimposed infection. Clinical correlation recommended. No significant pleural effusion identified. There is no pneumothorax. Stable cardiac silhouette. Median sternotomy wires and CABG vascular clips noted. There is osteopenia with degenerative changes of the spine. No acute fracture. IMPRESSION: Interval worsening of the pulmonary edema with possible superimposed infection. Clinical correlation is recommended. Electronically Signed   By: Elgie Collard M.D.   On: 01/16/2016 19:53    CBC  Recent Labs Lab  01/16/16 1915 01/17/16 0600 01/17/16 2323 01/19/16 0453  WBC 8.9 7.3 7.3 6.6  HGB 8.8* 7.9* 7.7* 7.9*  HCT 28.1* 24.8* 24.7* 24.9*  PLT 266 227 214 229  MCV 86.5 84.9 84.6 84.4  MCH 27.1 27.1 26.4 26.8  MCHC 31.3 31.9 31.2 31.7  RDW 12.9 13.0 13.0 13.0  Chemistries   Recent Labs Lab 01/17/16 0600 01/17/16 2323 01/19/16 0453 01/20/16 0400 01/21/16 0415  NA 138 136 137 135 132*  K 3.8 4.0 3.6 3.3* 4.0  CL 104 101 98* 96* 94*  CO2 21* 21* 22 23 21*  GLUCOSE 223* 151* 120* 126* 162*  BUN 93* 99* 109* 111* 117*  CREATININE 4.06* 4.26* 4.62* 4.66* 4.85*  CALCIUM 8.7* 8.5* 8.5* 8.4* 8.2*   ------------------------------------------------------------------------------------------------------------------ estimated creatinine clearance is 4.7 mL/min (by C-G formula based on Cr of 4.85). ------------------------------------------------------------------------------------------------------------------ No results for input(s): HGBA1C in the last 72 hours. ------------------------------------------------------------------------------------------------------------------ No results for input(s): CHOL, HDL, LDLCALC, TRIG, CHOLHDL, LDLDIRECT in the last 72 hours. ------------------------------------------------------------------------------------------------------------------ No results for input(s): TSH, T4TOTAL, T3FREE, THYROIDAB in the last 72 hours.  Invalid input(s): FREET3 ------------------------------------------------------------------------------------------------------------------ No results for input(s): VITAMINB12, FOLATE, FERRITIN, TIBC, IRON, RETICCTPCT in the last 72 hours.  Coagulation profile No results for input(s): INR, PROTIME in the last 168 hours.  No results for input(s): DDIMER in the last 72 hours.  Cardiac Enzymes  Recent Labs Lab 01/17/16 0600 01/17/16 1154 01/17/16 1815  TROPONINI 0.70*  0.80* 0.99* 0.99*    ------------------------------------------------------------------------------------------------------------------ Invalid input(s): POCBNP   Recent Labs  01/19/16 1623 01/19/16 1931 01/20/16 1152 01/20/16 1634 01/20/16 2050 01/21/16 0754  GLUCAP 168* 149* 333* 154* 226* 199*     Artem Bunte M.D. [facility] 01/21/2016, 11:56 AM  Pager: [phone] Between 7am to 7pm - call Pager - [phone]  After 7pm go to [email or web address] - password [email or web address]  Call night coverage person covering after 7pm

## 2016-01-21 NOTE — Progress Notes (Signed)
Inpatient Diabetes Program Recommendations  AACE/ADA: New Consensus Statement on Inpatient Glycemic Control (2015)  Target Ranges:  Prepandial:   less than 140 mg/dL      Peak postprandial:   less than 180 mg/dL (1-2 hours)      Critically ill patients:  140 - 180 mg/dL   Results for Urbano HeirFOUST, Jade B (MRN [id number]) as of 01/21/2016 12:45  Ref. Range 01/20/2016 11:52 01/20/2016 16:34 01/20/2016 20:50  Glucose-Capillary Latest Ref Range: 65-99 mg/dL 962333 (H) 952154 (H) 841226 (H)   Results for Urbano HeirFOUST, Ciin B (MRN [id number]) as of 01/21/2016 12:45  Ref. Range 01/21/2016 07:54 01/21/2016 11:50  Glucose-Capillary Latest Ref Range: 65-99 mg/dL 253199 (H) 664269 (H)    Home DM Meds: None  Current Insulin Orders: Novolog Sensitive Correction Scale/ SSI (0-9 units) TID AC + HS      Novolog 3 units tidwc     MD- Please consider increasing Novolog Meal Coverage to 4 units tidwc     --Will follow patient during hospitalization--  Ambrose FinlandJeannine Johnston Rudell Marlowe RN, MSN, CDE Diabetes Coordinator Inpatient Glycemic Control Team Team Pager: [phone] (8a-5p)

## 2016-01-22 LAB — CULTURE, BLOOD (ROUTINE X 2)
CULTURE: NO GROWTH
Culture: NO GROWTH

## 2016-01-22 LAB — GLUCOSE, CAPILLARY
GLUCOSE-CAPILLARY: 231 mg/dL — AB (ref 65–99)
Glucose-Capillary: 150 mg/dL — ABNORMAL HIGH (ref 65–99)
Glucose-Capillary: 114 mg/dL — ABNORMAL HIGH (ref 65–99)
Glucose-Capillary: 173 mg/dL — ABNORMAL HIGH (ref 65–99)

## 2016-01-22 MED ORDER — FUROSEMIDE 40 MG PO TABS
40.0000 mg | ORAL_TABLET | Freq: Every day | ORAL | Status: DC
  Administered 2016-01-23: 40 mg via ORAL
  Filled 2016-01-22: qty 1

## 2016-01-22 NOTE — Progress Notes (Signed)
[name] MRN: [id number] DOB: [birth year]   Cancelled Treatment:    Reason Eval/Treat Not Completed: Fatigue/lethargy limiting ability to participate.  Pt politely declined mobility at this time.  Will try back another time.     [name], [name] 01/22/2016, 2:44 PM

## 2016-01-22 NOTE — Progress Notes (Signed)
[facility]                                                                              Patient Demographics  Adrienne Bowman, is a 82 y.o. female, DOB - May 23, 1918, RUE:[id number]  Admit date - 01/16/2016   Admitting Physician Therisa Doyne, MD  Outpatient Primary MD for the patient is Ruthe Mannan, MD  LOS - 5   Chief Complaint  Patient presents with  . Shortness of Breath  . Chest Pain       Brief HPI   Adrienne Bowman is an 82 y.o. female past medical history chronic systolic heart failure with an EF of 45%, no presented to the ED with dyspnea and left-sided chest pain that started the morning of admission   Assessment & Plan    Acute on chronic diastolic CHF (congestive heart failure), NYHA class 1 (HCC)/ Elevated troponin/ Acute on chronic renal failure (HCC) - Will transition to oral home lasix regimen and assess how patient does on this.  - Negative balance of 4.6 L - Palliative care  following, recommendations pending - Per cardiology, continue Coreg, amlodipine, showed chest pain recur could consider Ranexa - 2-D echo showed EF of 50-55%, grade 1 diastolic dysfunction  Diabetes Mellitus controlled HbA1c: A1c 7.1, cont. sliding scale insulin.  Essential hypertension: Was elevated on admission - Improved, continue Coreg, hydralazine, amlodipine, Lasix, clonidine - Clonidine increased by cardiology  Peripheral vascular disease, unspecified (HCC)/ S/P AKA (above knee amputation) unilateral (HCC) Continue current home regimen.  Acute kidney injury on chronic kidney disease stage 3/4 - Creatinine continues to trend up, hold metolazone - Transition to oral lasix today - Await further recommendations from palliative medicine regarding goals of care   Hyperlipidemia: Cont stains.  Code Status: DNR  Family Communication: Discussed in detail with the patient, all imaging results, lab results explained to the patient   Disposition Plan:  D/c  planning after palliative meeting and snf available  Time Spent in minutes  25 minutes  Procedures  echo  Consults   cardiology  DVT Prophylaxis  heparin subcutaneous  Medications  Scheduled Meds: . amLODipine  10 mg Oral Daily  . antiseptic oral rinse  7 mL Mouth Rinse BID  . aspirin EC  81 mg Oral Daily  . atorvastatin  10 mg Oral q1800  . carvedilol  25 mg Oral BID WC  . cloNIDine  0.3 mg Transdermal Weekly  . famotidine  20 mg Oral Daily  . feeding supplement  1 Container Oral BID BM  . furosemide  120 mg Intravenous TID  . heparin subcutaneous  5,000 Units Subcutaneous 3 times per day  . hydrALAZINE  100 mg Oral 3 times per day  . insulin aspart  0-5 Units Subcutaneous QHS  . insulin aspart  0-9 Units Subcutaneous TID WC  . insulin aspart  3 Units Subcutaneous TID WC  . sodium chloride flush  3 mL Intravenous Q12H   Continuous Infusions:  PRN Meds:.sodium chloride, acetaminophen, acetaminophen, ALPRAZolam, levalbuterol, morphine injection, ondansetron (ZOFRAN) IV, sodium chloride flush   Antibiotics   Anti-infectives    Start  Dose/Rate Route Frequency Ordered Stop   01/17/16 0130  cefTRIAXone (ROCEPHIN) 1 g in dextrose 5 % 50 mL IVPB  Status:  Discontinued     1 g 100 mL/hr over 30 Minutes Intravenous Daily at bedtime 01/17/16 0034 01/17/16 0857   01/17/16 0130  azithromycin (ZITHROMAX) 500 mg in dextrose 5 % 250 mL IVPB  Status:  Discontinued     500 mg 250 mL/hr over 60 Minutes Intravenous Daily at bedtime 01/17/16 0034 01/17/16 0857        Subjective:   Adrienne Bowman states her breathing status is better. Has no new complaints reported to me.  Objective:   Filed Vitals:   01/22/16 0432 01/22/16 0533 01/22/16 0600 01/22/16 0805  BP:  154/75  137/48  Pulse:   73 84  Temp: 98 F (36.7 C)     TempSrc: Oral     Resp:   19 22  Height:      Weight: 42.411 kg (93 lb 8 oz)     SpO2:   100% 98%    Intake/Output Summary (Last 24 hours) at 01/22/16  1014 Last data filed at 01/22/16 0813  Gross per 24 hour  Intake    410 ml  Output   1275 ml  Net   -865 ml     Wt Readings from Last 3 Encounters:  01/22/16 42.411 kg (93 lb 8 oz)  11/26/15 46.324 kg (102 lb 2 oz)  08/20/15 48.807 kg (107 lb 9.6 oz)     Exam  General: Alert and Awake  NAD  HEENT:  PERRLA, EOMI, Anicteric Sclera, mucous membranes moist.   Neck: Supple, no JVD, no masses  CVS: S1 S2 auscultated, no rubs, murmurs or gallops. Regular rate and rhythm.  Respiratory: Clear to auscultation bilaterally, no wheezing, rales or rhonchi  Abdomen: Soft, nontender, nondistended, + bowel sounds, no guarding  Ext: no cyanosis clubbing, trace edema  Neuro: no new deficits  Skin: No rashes  Psych: mood and affect appropriate   Data Review   Micro Results Recent Results (from the past 240 hour(s))  MRSA PCR Screening     Status: None   Collection Time: 01/16/16 11:23 PM  Result Value Ref Range Status   MRSA by PCR NEGATIVE NEGATIVE Final    Comment:        The GeneXpert MRSA Assay (FDA approved for NASAL specimens only), is one component of a comprehensive MRSA colonization surveillance program. It is not intended to diagnose MRSA infection nor to guide or monitor treatment for MRSA infections.   Culture, blood (routine x 2) Call MD if unable to obtain prior to antibiotics being given     Status: None (Preliminary result)   Collection Time: 01/17/16  2:11 AM  Result Value Ref Range Status   Specimen Description BLOOD LEFT ARM  Final   Special Requests BOTTLES DRAWN AEROBIC AND ANAEROBIC 5CC  Final   Culture NO GROWTH 4 DAYS  Final   Report Status PENDING  Incomplete  Culture, blood (routine x 2) Call MD if unable to obtain prior to antibiotics being given     Status: None (Preliminary result)   Collection Time: 01/17/16  2:12 AM  Result Value Ref Range Status   Specimen Description BLOOD RIGHT HAND  Final   Special Requests BOTTLES DRAWN AEROBIC ONLY  3CC  Final   Culture NO GROWTH 4 DAYS  Final   Report Status PENDING  Incomplete    Radiology Reports Dg Chest 2 View  01/18/2016  CLINICAL DATA:  82 year old who was admitted 2 days ago with CHF and has improving though persistent shortness of breath. EXAM: CHEST  2 VIEW COMPARISON:  01/17/2016 and earlier. FINDINGS: Prior sternotomy for CABG. Cardiac silhouette moderately enlarged. Severe atherosclerosis involving the thoracic and upper abdominal aorta. Interval significant improvement in the interstitial and airspace pulmonary edema, though moderate CHF persists, asymmetric and increased in the right lung. Stable small bilateral pleural effusions. No new pulmonary parenchymal abnormalities. IMPRESSION: Improving CHF, though moderate interstitial and airspace pulmonary edema persist, asymmetric and increased in the right lung. Stable small bilateral pleural effusions. No new abnormalities. Electronically Signed   By: Hulan Saas M.D.   On: 01/18/2016 17:19   Dg Chest 2 View  01/17/2016  CLINICAL DATA:  Dyspnea for 1 day EXAM: CHEST  2 VIEW COMPARISON:  01/17/2016 with the patient is status post CABG with extensive aortic calcification. Mild to moderate cardiac silhouette enlargement is stable. Central venous congestion is also FINDINGS: Unchanged. There is patchy multifocal bilateral airspace opacification. Airspace opacities are predominantly alveolar with resolving interstitial opacities. Small bilateral pleural effusions are present. IMPRESSION: Findings appear most consistent with evolving pulmonary edema with mild improvement. Radiographic follow-up suggested to monitor, as the possibility of infectious infiltrate superimposed on pulmonary edema is not excluded. Electronically Signed   By: Esperanza Heir M.D.   On: 01/17/2016 12:06   Dg Chest Port 1 View  01/17/2016  CLINICAL DATA:  82 year old female with chest pain and shortness of breath EXAM: PORTABLE CHEST 1 VIEW COMPARISON:  Radiograph  dated 01/16/2016 FINDINGS: There is diffuse interstitial prominence with bilateral mid to lower lung field interstitial and airspace hazy densities. Overall there has been interval progression of the airspace densities since the prior study most compatible with pneumonia. There is no significant pleural effusion. No pneumothorax. The cardiac borders are silhouetted. Median sternotomy wires and CABG vascular clips noted. The aorta is tortuous. There is atherosclerotic calcification of the visualized aorta. No acute osseous pathology identified. IMPRESSION: Interval progression of the bilateral airspace opacities. Follow-up recommended. Electronically Signed   By: Elgie Collard M.D.   On: 01/17/2016 00:58   Dg Chest Port 1 View  01/16/2016  CLINICAL DATA:  82 year old female with shortness of breath EXAM: PORTABLE CHEST 1 VIEW COMPARISON:  Radiograph dated 07/12/2015 FINDINGS: Single portable view of the chest demonstrate emphysematous changes of the lungs. There is diffuse interstitial prominence with areas of hazy and nodular airspace opacity predominantly involving the mid to lower lung fields. Findings most compatible with worsening interstitial edema with possible superimposed infection. Clinical correlation recommended. No significant pleural effusion identified. There is no pneumothorax. Stable cardiac silhouette. Median sternotomy wires and CABG vascular clips noted. There is osteopenia with degenerative changes of the spine. No acute fracture. IMPRESSION: Interval worsening of the pulmonary edema with possible superimposed infection. Clinical correlation is recommended. Electronically Signed   By: Elgie Collard M.D.   On: 01/16/2016 19:53    CBC  Recent Labs Lab 01/16/16 1915 01/17/16 0600 01/17/16 2323 01/19/16 0453  WBC 8.9 7.3 7.3 6.6  HGB 8.8* 7.9* 7.7* 7.9*  HCT 28.1* 24.8* 24.7* 24.9*  PLT 266 227 214 229  MCV 86.5 84.9 84.6 84.4  MCH 27.1 27.1 26.4 26.8  MCHC 31.3 31.9 31.2  31.7  RDW 12.9 13.0 13.0 13.0    Chemistries   Recent Labs Lab 01/17/16 0600 01/17/16 2323 01/19/16 0453 01/20/16 0400 01/21/16 0415  NA 138 136 137 135 132*  K 3.8  4.0 3.6 3.3* 4.0  CL 104 101 98* 96* 94*  CO2 21* 21* 22 23 21*  GLUCOSE 223* 151* 120* 126* 162*  BUN 93* 99* 109* 111* 117*  CREATININE 4.06* 4.26* 4.62* 4.66* 4.85*  CALCIUM 8.7* 8.5* 8.5* 8.4* 8.2*   ------------------------------------------------------------------------------------------------------------------ estimated creatinine clearance is 4.6 mL/min (by C-G formula based on Cr of 4.85). ------------------------------------------------------------------------------------------------------------------ No results for input(s): HGBA1C in the last 72 hours. ------------------------------------------------------------------------------------------------------------------ No results for input(s): CHOL, HDL, LDLCALC, TRIG, CHOLHDL, LDLDIRECT in the last 72 hours. ------------------------------------------------------------------------------------------------------------------ No results for input(s): TSH, T4TOTAL, T3FREE, THYROIDAB in the last 72 hours.  Invalid input(s): FREET3 ------------------------------------------------------------------------------------------------------------------ No results for input(s): VITAMINB12, FOLATE, FERRITIN, TIBC, IRON, RETICCTPCT in the last 72 hours.  Coagulation profile No results for input(s): INR, PROTIME in the last 168 hours.  No results for input(s): DDIMER in the last 72 hours.  Cardiac Enzymes  Recent Labs Lab 01/17/16 0600 01/17/16 1154 01/17/16 1815  TROPONINI 0.70*  0.80* 0.99* 0.99*   ------------------------------------------------------------------------------------------------------------------ Invalid input(s): POCBNP   Recent Labs  01/20/16 2050 01/21/16 0754 01/21/16 1150 01/21/16 1625 01/21/16 2000 01/22/16 0751  GLUCAP 226* 199*  269* 254* 117* 150*     Penny Pia M.D. [facility] 01/22/2016, 10:14 AM  Pager: [phone] Between 7am to 7pm - call Pager - [phone]  After 7pm go to [email or web address] - password [email or web address]  Call night coverage person covering after 7pm

## 2016-01-22 NOTE — Care Management Important Message (Signed)
Important Message  Patient Details  Name: Adrienne HeirJessie B Lobos MRN: [id number] Date of Birth: 1918-03-23   Medicare Important Message Given:  Yes    Karston Hyland Abena 01/22/2016, 11:40 AM

## 2016-01-23 LAB — BASIC METABOLIC PANEL
Anion gap: 14 (ref 5–15)
BUN: 131 mg/dL — ABNORMAL HIGH (ref 6–20)
BUN: 131 mg/dL — AB (ref 4–21)
CHLORIDE: 90 mmol/L — AB (ref 101–111)
CO2: 27 mmol/L (ref 22–32)
CREATININE: 4.88 mg/dL — AB (ref 0.44–1.00)
Calcium: 8.2 mg/dL — ABNORMAL LOW (ref 8.9–10.3)
Creatinine: 4.9 mg/dL — AB (ref 0.5–1.1)
GFR calc non Af Amer: 7 mL/min — ABNORMAL LOW (ref >60)
GFR, EST AFRICAN AMERICAN: 8 mL/min — AB (ref >60)
Glucose, Bld: 135 mg/dL — ABNORMAL HIGH (ref 65–99)
Glucose: 135 mg/dL
POTASSIUM: 3.6 mmol/L (ref 3.5–5.1)
SODIUM: 131 mmol/L — AB (ref 135–145)
Sodium: 131 mmol/L — AB (ref 137–147)

## 2016-01-23 LAB — GLUCOSE, CAPILLARY
GLUCOSE-CAPILLARY: 142 mg/dL — AB (ref 65–99)
GLUCOSE-CAPILLARY: 244 mg/dL — AB (ref 65–99)

## 2016-01-23 MED ORDER — CLONIDINE HCL 0.3 MG/24HR TD PTWK: 0.3000 mg | MEDICATED_PATCH | TRANSDERMAL | Status: DC

## 2016-01-23 NOTE — Discharge Summary (Signed)
Physician Discharge Summary  Adrienne Bowman ZOX:[id number] DOB: 30-Nov-1917 DOA: 01/16/2016  PCP: [name], MD  Admit date: 01/16/2016 Discharge date: 01/23/2016  Time spent: > 35 minutes  Recommendations for Outpatient Follow-up:  1. Monitor Serum creatinine levels 2. Patient will be discharged back on her home prior to admission lasix regimen. Please monitor and decide whether or not to increase lasix  3. Recommend diabetes control with diet for now. Monitor blood sugars and decide whether or not patient will require hypoglycemic agents  Discharge Diagnoses:  Active Problems:   Diabetes (HCC)   Essential hypertension   CAD (coronary artery disease)   Peripheral vascular disease, unspecified (HCC)   S/P AKA (above knee amputation) unilateral (HCC)   Acute on chronic diastolic CHF (congestive heart failure), NYHA class 1 (HCC)   Elevated troponin   Acute on chronic renal failure (HCC)   CHF exacerbation (HCC)   Acute on chronic diastolic congestive heart failure (HCC)   Abnormal chest x-ray   Hyperlipidemia   Acute on chronic diastolic (congestive) heart failure (HCC)   Protein-calorie malnutrition, severe (HCC)   Palliative care encounter   Goals of care, counseling/discussion   Encounter for hospice care discussion   Discharge Condition: stable  Diet recommendation: diabetic diet  Filed Weights   01/21/16 0334 01/22/16 0432 01/23/16 0403  Weight: 42.774 kg (94 lb 4.8 oz) 42.411 kg (93 lb 8 oz) 43.001 kg (94 lb 12.8 oz)    History of present illness:  From original HPI: Adrienne HeirJessie B Bowman is an 82 y.o. female past medical history chronic systolic heart failure with an EF of 45%, no presented to the ED with dyspnea and left-sided chest pain that started the morning of admission  Hospital Course:  Acute on chronic diastolic CHF (congestive heart failure), NYHA class 1 (HCC)/ Elevated troponin/ Acute on chronic renal failure (HCC) - Continue lasix 40 mg po daily. May need to  increase dose as outpatient. Was not too aggressive with lasix on d/c due to CKD - Negative balance of 3.8 L - Per cardiology, continue Coreg, amlodipine, showed chest pain recur could consider Ranexa - 2-D echo showed EF of 50-55%, grade 1 diastolic dysfunction  Diabetes Mellitus controlled HbA1c: A1c 7.1 - Recommend control with diet on discharge. May require hypoglycemic agents. Recommend monitoring blood sugars while at skilled nursing facility to decide whether or not to initiate oral hypoglycemic agents  Essential hypertension: Was elevated on admission - Improved, continue Coreg, hydralazine, amlodipine, Lasix, clonidine - Clonidine increased by cardiology  Peripheral vascular disease, unspecified (HCC)/ S/P AKA (above knee amputation) unilateral (HCC) Continue current home regimen.  Acute kidney injury on chronic kidney disease stage 3/4 - Creatinine continues to trend up, hold metolazone - Transition to oral lasix today - Await further recommendations from palliative medicine regarding goals of care   Hyperlipidemia: Cont stains.  Procedures:  None  Consultations:  Palliative care  Discharge Exam: Filed Vitals:   01/23/16 0403 01/23/16 0700  BP: 135/44 143/41  Pulse: 71 70  Temp: 98.1 F (36.7 C) 98.3 F (36.8 C)  Resp: 16 17    General: Pt in nad, alert and awake Cardiovascular: s1 and s2 wnl, no rubs Respiratory: cta bl, no wheezes  Discharge Instructions   Discharge Instructions    (HEART FAILURE PATIENTS) Call MD:  Anytime you have any of the following symptoms: 1) 3 pound weight gain in 24 hours or 5 pounds in 1 week 2) shortness of breath, with or without a dry  hacking cough 3) swelling in the hands, feet or stomach 4) if you have to sleep on extra pillows at night in order to breathe.    Complete by:  As directed      Call MD for:  difficulty breathing, headache or visual disturbances    Complete by:  As directed      Call MD for:  extreme  fatigue    Complete by:  As directed      Call MD for:  redness, tenderness, or signs of infection (pain, swelling, redness, odor or green/yellow discharge around incision site)    Complete by:  As directed      Call MD for:  temperature >100.4    Complete by:  As directed      Diet - low sodium heart healthy    Complete by:  As directed      Discharge instructions    Complete by:  As directed   Continue to monitor blood sugars. Recommended diabetic diet Continue daily weights and recommend patient follow-up with primary care physician or cardiologist within the next one or 2 weeks  Monitor serum creatinine     Increase activity slowly    Complete by:  As directed           Current Discharge Medication List    START taking these medications   Details  cloNIDine (CATAPRES - DOSED IN MG/24 HR) 0.3 mg/24hr patch Place 1 patch (0.3 mg total) onto the skin once a week. Qty: 7 patch, Refills: 0      CONTINUE these medications which have NOT CHANGED   Details  amlodipine-atorvastatin (CADUET) 10-10 MG tablet TAKE ONE TABLET BY MOUTH ONCE DAILY Qty: 90 tablet, Refills: 2    aspirin EC 81 MG tablet Take 81 mg by mouth daily.    carvedilol (COREG) 25 MG tablet Take 1 tablet (25 mg total) by mouth 2 (two) times daily with a meal. Qty: 180 tablet, Refills: 3    famotidine (PEPCID) 20 MG tablet TAKE ONE TABLET BY MOUTH ONCE DAILY Qty: 30 tablet, Refills: 5    furosemide (LASIX) 40 MG tablet TAKE ONE TABLET BY MOUTH ONCE DAILY Qty: 30 tablet, Refills: 5    hydrALAZINE (APRESOLINE) 100 MG tablet TAKE ONE TABLET BY MOUTH THREE TIMES DAILY Qty: 90 tablet, Refills: 6    multivitamin (THERAGRAN) per tablet Take 1 tablet by mouth daily.      nitroGLYCERIN (NITROSTAT) 0.4 MG SL tablet Place 1 tablet (0.4 mg total) under the tongue every 5 (five) minutes as needed for chest pain. Qty: 30 tablet, Refills: 0    GLUCERNA (GLUCERNA) LIQD Take 237 mLs by mouth 2 (two) times daily between  meals. Qty: 60 Can, Refills: 0      STOP taking these medications     cloNIDine (CATAPRES) 0.2 MG tablet        No Known Allergies    The results of significant diagnostics from this hospitalization (including imaging, microbiology, ancillary and laboratory) are listed below for reference.    Significant Diagnostic Studies: Dg Chest 2 View  01/18/2016  CLINICAL DATA:  82 year old who was admitted 2 days ago with CHF and has improving though persistent shortness of breath. EXAM: CHEST  2 VIEW COMPARISON:  01/17/2016 and earlier. FINDINGS: Prior sternotomy for CABG. Cardiac silhouette moderately enlarged. Severe atherosclerosis involving the thoracic and upper abdominal aorta. Interval significant improvement in the interstitial and airspace pulmonary edema, though moderate CHF persists, asymmetric and increased in the right  lung. Stable small bilateral pleural effusions. No new pulmonary parenchymal abnormalities. IMPRESSION: Improving CHF, though moderate interstitial and airspace pulmonary edema persist, asymmetric and increased in the right lung. Stable small bilateral pleural effusions. No new abnormalities. Electronically Signed   By: Hulan Saas M.D.   On: 01/18/2016 17:19   Dg Chest 2 View  01/17/2016  CLINICAL DATA:  Dyspnea for 1 day EXAM: CHEST  2 VIEW COMPARISON:  01/17/2016 with the patient is status post CABG with extensive aortic calcification. Mild to moderate cardiac silhouette enlargement is stable. Central venous congestion is also FINDINGS: Unchanged. There is patchy multifocal bilateral airspace opacification. Airspace opacities are predominantly alveolar with resolving interstitial opacities. Small bilateral pleural effusions are present. IMPRESSION: Findings appear most consistent with evolving pulmonary edema with mild improvement. Radiographic follow-up suggested to monitor, as the possibility of infectious infiltrate superimposed on pulmonary edema is not excluded.  Electronically Signed   By: Esperanza Heir M.D.   On: 01/17/2016 12:06   Dg Chest Port 1 View  01/17/2016  CLINICAL DATA:  82 year old female with chest pain and shortness of breath EXAM: PORTABLE CHEST 1 VIEW COMPARISON:  Radiograph dated 01/16/2016 FINDINGS: There is diffuse interstitial prominence with bilateral mid to lower lung field interstitial and airspace hazy densities. Overall there has been interval progression of the airspace densities since the prior study most compatible with pneumonia. There is no significant pleural effusion. No pneumothorax. The cardiac borders are silhouetted. Median sternotomy wires and CABG vascular clips noted. The aorta is tortuous. There is atherosclerotic calcification of the visualized aorta. No acute osseous pathology identified. IMPRESSION: Interval progression of the bilateral airspace opacities. Follow-up recommended. Electronically Signed   By: Elgie Collard M.D.   On: 01/17/2016 00:58   Dg Chest Port 1 View  01/16/2016  CLINICAL DATA:  82 year old female with shortness of breath EXAM: PORTABLE CHEST 1 VIEW COMPARISON:  Radiograph dated 07/12/2015 FINDINGS: Single portable view of the chest demonstrate emphysematous changes of the lungs. There is diffuse interstitial prominence with areas of hazy and nodular airspace opacity predominantly involving the mid to lower lung fields. Findings most compatible with worsening interstitial edema with possible superimposed infection. Clinical correlation recommended. No significant pleural effusion identified. There is no pneumothorax. Stable cardiac silhouette. Median sternotomy wires and CABG vascular clips noted. There is osteopenia with degenerative changes of the spine. No acute fracture. IMPRESSION: Interval worsening of the pulmonary edema with possible superimposed infection. Clinical correlation is recommended. Electronically Signed   By: Elgie Collard M.D.   On: 01/16/2016 19:53    Microbiology: Recent  Results (from the past 240 hour(s))  MRSA PCR Screening     Status: None   Collection Time: 01/16/16 11:23 PM  Result Value Ref Range Status   MRSA by PCR NEGATIVE NEGATIVE Final    Comment:        The GeneXpert MRSA Assay (FDA approved for NASAL specimens only), is one component of a comprehensive MRSA colonization surveillance program. It is not intended to diagnose MRSA infection nor to guide or monitor treatment for MRSA infections.   Culture, blood (routine x 2) Call MD if unable to obtain prior to antibiotics being given     Status: None   Collection Time: 01/17/16  2:11 AM  Result Value Ref Range Status   Specimen Description BLOOD LEFT ARM  Final   Special Requests BOTTLES DRAWN AEROBIC AND ANAEROBIC 5CC  Final   Culture NO GROWTH 5 DAYS  Final   Report Status 01/22/2016  FINAL  Final  Culture, blood (routine x 2) Call MD if unable to obtain prior to antibiotics being given     Status: None   Collection Time: 01/17/16  2:12 AM  Result Value Ref Range Status   Specimen Description BLOOD RIGHT HAND  Final   Special Requests BOTTLES DRAWN AEROBIC ONLY 3CC  Final   Culture NO GROWTH 5 DAYS  Final   Report Status 01/22/2016 FINAL  Final     Labs: Basic Metabolic Panel:  Recent Labs Lab 01/17/16 2323 01/19/16 0453 01/20/16 0400 01/21/16 0415 01/23/16 0554  NA 136 137 135 132* 131*  K 4.0 3.6 3.3* 4.0 3.6  CL 101 98* 96* 94* 90*  CO2 21* 22 23 21* 27  GLUCOSE 151* 120* 126* 162* 135*  BUN 99* 109* 111* 117* 131*  CREATININE 4.26* 4.62* 4.66* 4.85* 4.88*  CALCIUM 8.5* 8.5* 8.4* 8.2* 8.2*   Liver Function Tests: No results for input(s): AST, ALT, ALKPHOS, BILITOT, PROT, ALBUMIN in the last 168 hours. No results for input(s): LIPASE, AMYLASE in the last 168 hours. No results for input(s): AMMONIA in the last 168 hours. CBC:  Recent Labs Lab 01/16/16 1915 01/17/16 0600 01/17/16 2323 01/19/16 0453  WBC 8.9 7.3 7.3 6.6  HGB 8.8* 7.9* 7.7* 7.9*  HCT 28.1*  24.8* 24.7* 24.9*  MCV 86.5 84.9 84.6 84.4  PLT 266 227 214 229   Cardiac Enzymes:  Recent Labs Lab 01/16/16 2343 01/17/16 0600 01/17/16 1154 01/17/16 1815  TROPONINI 0.21* 0.70*  0.80* 0.99* 0.99*   BNP: BNP (last 3 results)  Recent Labs  01/16/16 1915 01/17/16 0600 01/18/16 1622  BNP 3887.7* >4500.0* 2597.7*    ProBNP (last 3 results) No results for input(s): PROBNP in the last 8760 hours.  CBG:  Recent Labs Lab 01/22/16 0751 01/22/16 1123 01/22/16 1640 01/22/16 2003 01/23/16 0754  GLUCAP 150* 231* 114* 173* 142*    Signed:  Penny Pia MD.  [facility] 01/23/2016, 11:41 AM

## 2016-01-23 NOTE — Clinical Social Work Placement (Signed)
   CLINICAL SOCIAL WORK PLACEMENT  NOTE  Date:  01/23/2016  Patient Details  Name: Adrienne Bowman MRN: [id number] Date of Birth: 11/19/1917  Clinical Social Work is seeking post-discharge placement for this patient at the Skilled  Nursing Facility level of care (*CSW will initial, date and re-position this form in  chart as items are completed):  Yes   Patient/family provided with [facility] Clinical Social Work Department's list of facilities offering this level of care within the geographic area requested by the patient (or if unable, by the patient's family).  Yes   Patient/family informed of their freedom to choose among providers that offer the needed level of care, that participate in Medicare, Medicaid or managed care program needed by the patient, have an available bed and are willing to accept the patient.  Yes   Patient/family informed of [facility]'s ownership interest in [facility] and [facility], as well as of the fact that they are under no obligation to receive care at these facilities.  PASRR submitted to EDS on 01/21/16     PASRR number received on 01/21/16     Existing PASRR number confirmed on       FL2 transmitted to all facilities in geographic area requested by pt/family on 01/20/16     FL2 transmitted to all facilities within larger geographic area on       Patient informed that his/her managed care company has contracts with or will negotiate with certain facilities, including the following:        Yes   Patient/family informed of bed offers received.  Patient chooses bed at [facility]     Physician recommends and patient chooses bed at      Patient to be transferred to [facility] on 01/23/16.  Patient to be transferred to facility by Family has opted to transport the patient     Patient family notified on 01/23/16 of transfer.  Name of family member notified:  Alvean     PHYSICIAN Please prepare priority discharge summary,  including medications, Please prepare prescriptions, Please sign FL2, Please sign DNR     Additional Comment:  Per MD patient ready for DC to [facility]. RN, patient, patient's family, and facility notified of DC. RN given number for report. DC packet on chart. Family is going to transport the patient. Delphia GratesJennifer Cruz at facility is ready to admit the patient and the family will meet with them at time of admission to do paperwork. CSW signing off.   _______________________________________________ Venita Lickampbell, Avya Flavell B, LCSW 01/23/2016, 1:36 PM

## 2016-01-26 ENCOUNTER — Non-Acute Institutional Stay (SKILLED_NURSING_FACILITY): Payer: Medicare Other | Admitting: Internal Medicine

## 2016-01-26 ENCOUNTER — Telehealth: Payer: Self-pay | Admitting: *Deleted

## 2016-01-26 ENCOUNTER — Encounter: Payer: Self-pay | Admitting: Internal Medicine

## 2016-01-26 DIAGNOSIS — R5381 Other malaise: Secondary | ICD-10-CM | POA: Diagnosis not present

## 2016-01-26 DIAGNOSIS — I5032 Chronic diastolic (congestive) heart failure: Secondary | ICD-10-CM | POA: Diagnosis not present

## 2016-01-26 DIAGNOSIS — E43 Unspecified severe protein-calorie malnutrition: Secondary | ICD-10-CM

## 2016-01-26 DIAGNOSIS — E785 Hyperlipidemia, unspecified: Secondary | ICD-10-CM

## 2016-01-26 DIAGNOSIS — K219 Gastro-esophageal reflux disease without esophagitis: Secondary | ICD-10-CM | POA: Diagnosis not present

## 2016-01-26 DIAGNOSIS — N184 Chronic kidney disease, stage 4 (severe): Secondary | ICD-10-CM

## 2016-01-26 DIAGNOSIS — D631 Anemia in chronic kidney disease: Secondary | ICD-10-CM

## 2016-01-26 DIAGNOSIS — E1122 Type 2 diabetes mellitus with diabetic chronic kidney disease: Secondary | ICD-10-CM

## 2016-01-26 DIAGNOSIS — I214 Non-ST elevation (NSTEMI) myocardial infarction: Secondary | ICD-10-CM

## 2016-01-26 DIAGNOSIS — I10 Essential (primary) hypertension: Secondary | ICD-10-CM

## 2016-01-26 DIAGNOSIS — I739 Peripheral vascular disease, unspecified: Secondary | ICD-10-CM

## 2016-01-26 DIAGNOSIS — N189 Chronic kidney disease, unspecified: Secondary | ICD-10-CM

## 2016-01-26 NOTE — Telephone Encounter (Signed)
Transitional care call attempted.  No answer and no voice mail to leave a message. Will continue attempts to contact patient. 

## 2016-01-26 NOTE — Progress Notes (Signed)
LOCATION: [facility]  PCP: Ruthe Mannan, MD   Code Status: DNR  Goals of care: Advanced Directive information Advanced Directives 01/26/2016  Does patient have an advance directive? Yes  Type of Advance Directive Out of facility DNR (pink MOST or yellow form)  Does patient want to make changes to advanced directive? No - Patient declined  Copy of advanced directive(s) in chart? Yes  Would patient like information on creating an advanced directive? -       Extended Emergency Contact Information Primary Emergency Contact: Hines,Alean Address: [address]          [place], [place] [address] Darden [place] of [place] Home Phone: [phone] Relation: Daughter Secondary Emergency Contact: Jeri Cos Address: [address]          [place], [place] [address] [place] of [place] Home Phone: [phone] Mobile Phone: [phone] Relation: Daughter   No Known Allergies  Chief Complaint  Patient presents with  . New Admit To SNF    New Admission     HPI:  Patient is a 82 y.o. female seen today for short term rehabilitation post hospital admission from 01/16/16-01/23/16 with acute on chronic diastolic heart failure. She was treated with diuresis. She has PMH of DM, CKD, HLD, PVD among others. She is seen in her room today. She denies any concern.   Review of Systems:  Constitutional: Negative for fever, chills. Easy fatigue present.    HENT: Negative for headache, congestion, nasal discharge, hearing loss, sore throat, difficulty swallowing. Eyes: Negative for blurred vision, double vision and discharge. Wears glasses.  Respiratory: Negative for cough, shortness of breath and wheezing. Breathing has improved.   Cardiovascular: Negative for chest pain, palpitations, leg swelling.  Gastrointestinal: Negative for heartburn, nausea, vomiting, abdominal pain, loss of appetite, melena, diarrhea and constipation. had bowel movement was last night.  Genitourinary: Negative  for dysuria and flank pain.  Musculoskeletal: Negative for back pain, fall in the facility.  Skin: Negative for itching, rash.  Neurological: Negative for weakness and dizziness. Psychiatric/Behavioral: Negative for depression.    Past Medical History  Diagnosis Date  . CHF (congestive heart failure) (HCC) 2002    acute CHF while getting lower extremilty arteriograms /  Led to CABG  x6  . Hypertension     very difficult to control  . PVD (peripheral vascular disease) (HCC)     extensive PVD / left AKA  . Murmur, heart     soft systolic outflow murmur and a grade 2/6 murmur of the aortic insufficiency  . S/P AKA (above knee amputation) (HCC)     left AKA with prosthesis  . DIABETES MELLITUS, TYPE II   . NEPHROPATHY, DIABETIC   . Coronary artery disease 2002    CABG x6 / left internal mammary artery graft to the LAD, a saphenous vein graft to the diagonal, a sequential saphenous vein graft to the 1st and 2nd obtuse marginal branches, and a sequential saphenous vein graft the the distal right coronary artery and posterior descending  . Anemia   . CKD (chronic kidney disease)   . Advanced age    Past Surgical History  Procedure Laterality Date  . Coronary artery bypass graft  2002    x6 per Dr. Laneta Simmers  . Eye surgery  1987 & 1988     left eye  . Vascular surgery      multiple peripheral vascular surgeries  . Left aka  2006  . Right renal artery stent  2004  .  Rfpbpg  2002  . Lower extremity angiogram  February 21, 2012  . Pr vein bypass graft,aorto-fem-pop  Oct. 17, 2012    Right  Fem-Distal post. Tibial artery BPG.  . Lower extremity angiogram N/A 02/21/2012    Procedure: LOWER EXTREMITY ANGIOGRAM;  Surgeon: Nada Libman, MD;  Location: [facility] CATH LAB;  Service: Cardiovascular;  Laterality: N/A;  . Lower extremity angiogram Right 12/25/2012    Procedure: LOWER EXTREMITY ANGIOGRAM;  Surgeon: Nada Libman, MD;  Location: [facility] CATH LAB;  Service: Cardiovascular;  Laterality: Right;  rt  leg angio  . Abdominal angiogram  12/25/2012    Procedure: ABDOMINAL ANGIOGRAM;  Surgeon: Nada Libman, MD;  Location: [facility] CATH LAB;  Service: Cardiovascular;;   Social History:   reports that she has never smoked. She has never used smokeless tobacco. She reports that she does not drink alcohol or use illicit drugs.  Family History  Problem Relation Age of Onset  . Coronary artery disease      prevalent in sibling  . Heart disease Mother   . Hypertension Daughter   . Cancer Son     Lung  and  Throat  . Heart attack Son   . Hyperlipidemia Daughter   . Hypertension Daughter   . Hypertension Daughter   . Hypertension Daughter     Medications:   Medication List       This list is accurate as of: 01/26/16  4:40 PM.  Always use your most recent med list.               amlodipine-atorvastatin 10-10 MG tablet  Commonly known as:  CADUET  TAKE ONE TABLET BY MOUTH ONCE DAILY     aspirin EC 81 MG tablet  Take 81 mg by mouth daily.     carvedilol 25 MG tablet  Commonly known as:  COREG  Take 1 tablet (25 mg total) by mouth 2 (two) times daily with a meal.     cloNIDine 0.3 mg/24hr patch  Commonly known as:  CATAPRES - Dosed in mg/24 hr  Place 1 patch (0.3 mg total) onto the skin once a week.     famotidine 20 MG tablet  Commonly known as:  PEPCID  TAKE ONE TABLET BY MOUTH ONCE DAILY     furosemide 40 MG tablet  Commonly known as:  LASIX  TAKE ONE TABLET BY MOUTH ONCE DAILY     hydrALAZINE 100 MG tablet  Commonly known as:  APRESOLINE  TAKE ONE TABLET BY MOUTH THREE TIMES DAILY     multivitamin per tablet  Take 1 tablet by mouth daily.     nitroGLYCERIN 0.4 MG SL tablet  Commonly known as:  NITROSTAT  Place 1 tablet (0.4 mg total) under the tongue every 5 (five) minutes as needed for chest pain.     UNABLE TO FIND  Med Name: Med pass 120 mL by mouth twice daily between meals as supplement        Immunizations: Immunization History  Administered Date(s)  Administered  . PPD Test 01/23/2016     Physical Exam:  Filed Vitals:   01/26/16 1630  BP: 120/80  Pulse: 62  Temp: 98.6 F (37 C)  TempSrc: Oral  Resp: 18  Height: 5\' 4"  (1.626 m)  Weight: 102 lb (46.267 kg)  SpO2: 98%   Body mass index is 17.5 kg/(m^2).  General- elderly female, thin built, in no acute distress Head- normocephalic, atraumatic Nose-  no maxillary or frontal sinus  tenderness, no nasal discharge Throat- moist mucus membrane, had dentures Eyes- PERRLA, EOMI, no pallor, no icterus, no discharge, normal conjunctiva, normal sclera Neck- no cervical lymphadenopathy Cardiovascular- normal s1,s2, + murmur, trace edema to right leg Respiratory- bilateral clear to auscultation, no wheeze, no rhonchi, no crackles, no use of accessory muscles Abdomen- bowel sounds present, soft, non tender Musculoskeletal- left AKA. able to move all other extremities, generalized weakness present Neurological- no focal deficit, alert and oriented to person, place and time Skin- warm and dry Psychiatry- normal mood and affect    Labs reviewed: Basic Metabolic Panel:  Recent Labs  16/08/9602/08/17 0400 01/21/16 0415 01/23/16 01/23/16 0554  NA 135 132* 131* 131*  K 3.3* 4.0  --  3.6  CL 96* 94*  --  90*  CO2 23 21*  --  27  GLUCOSE 126* 162*  --  135*  BUN 111* 117* 131* 131*  CREATININE 4.66* 4.85* 4.9* 4.88*  CALCIUM 8.4* 8.2*  --  8.2*   Liver Function Tests: No results for input(s): AST, ALT, ALKPHOS, BILITOT, PROT, ALBUMIN in the last 8760 hours. No results for input(s): LIPASE, AMYLASE in the last 8760 hours. No results for input(s): AMMONIA in the last 8760 hours. CBC:  Recent Labs  01/17/16 0600 01/17/16 2323 01/19/16 0453  WBC 7.3 7.3 6.6  HGB 7.9* 7.7* 7.9*  HCT 24.8* 24.7* 24.9*  MCV 84.9 84.6 84.4  PLT 227 214 229   BNP: Invalid input(s): POCBNP CBG:  Recent Labs  01/22/16 2003 01/23/16 0754 01/23/16 1159  GLUCAP 173* 142* 244*    Radiological  Exams: Dg Chest 2 View  01/18/2016  CLINICAL DATA:  82 year old who was admitted 2 days ago with CHF and has improving though persistent shortness of breath. EXAM: CHEST  2 VIEW COMPARISON:  01/17/2016 and earlier. FINDINGS: Prior sternotomy for CABG. Cardiac silhouette moderately enlarged. Severe atherosclerosis involving the thoracic and upper abdominal aorta. Interval significant improvement in the interstitial and airspace pulmonary edema, though moderate CHF persists, asymmetric and increased in the right lung. Stable small bilateral pleural effusions. No new pulmonary parenchymal abnormalities. IMPRESSION: Improving CHF, though moderate interstitial and airspace pulmonary edema persist, asymmetric and increased in the right lung. Stable small bilateral pleural effusions. No new abnormalities. Electronically Signed   By: Hulan Saashomas  Lawrence M.D.   On: 01/18/2016 17:19   Dg Chest 2 View  01/17/2016  CLINICAL DATA:  Dyspnea for 1 day EXAM: CHEST  2 VIEW COMPARISON:  01/17/2016 with the patient is status post CABG with extensive aortic calcification. Mild to moderate cardiac silhouette enlargement is stable. Central venous congestion is also FINDINGS: Unchanged. There is patchy multifocal bilateral airspace opacification. Airspace opacities are predominantly alveolar with resolving interstitial opacities. Small bilateral pleural effusions are present. IMPRESSION: Findings appear most consistent with evolving pulmonary edema with mild improvement. Radiographic follow-up suggested to monitor, as the possibility of infectious infiltrate superimposed on pulmonary edema is not excluded. Electronically Signed   By: Esperanza Heiraymond  Rubner M.D.   On: 01/17/2016 12:06   Dg Chest Port 1 View  01/17/2016  CLINICAL DATA:  82 year old female with chest pain and shortness of breath EXAM: PORTABLE CHEST 1 VIEW COMPARISON:  Radiograph dated 01/16/2016 FINDINGS: There is diffuse interstitial prominence with bilateral mid to lower lung  field interstitial and airspace hazy densities. Overall there has been interval progression of the airspace densities since the prior study most compatible with pneumonia. There is no significant pleural effusion. No pneumothorax. The cardiac borders are silhouetted. Median  sternotomy wires and CABG vascular clips noted. The aorta is tortuous. There is atherosclerotic calcification of the visualized aorta. No acute osseous pathology identified. IMPRESSION: Interval progression of the bilateral airspace opacities. Follow-up recommended. Electronically Signed   By: Elgie Collard M.D.   On: 01/17/2016 00:58   Dg Chest Port 1 View  01/16/2016  CLINICAL DATA:  82 year old female with shortness of breath EXAM: PORTABLE CHEST 1 VIEW COMPARISON:  Radiograph dated 07/12/2015 FINDINGS: Single portable view of the chest demonstrate emphysematous changes of the lungs. There is diffuse interstitial prominence with areas of hazy and nodular airspace opacity predominantly involving the mid to lower lung fields. Findings most compatible with worsening interstitial edema with possible superimposed infection. Clinical correlation recommended. No significant pleural effusion identified. There is no pneumothorax. Stable cardiac silhouette. Median sternotomy wires and CABG vascular clips noted. There is osteopenia with degenerative changes of the spine. No acute fracture. IMPRESSION: Interval worsening of the pulmonary edema with possible superimposed infection. Clinical correlation is recommended. Electronically Signed   By: Elgie Collard M.D.   On: 01/16/2016 19:53    Assessment/Plan  Physical deconditioning Will have her work with physical therapy and occupational therapy team to help with gait training and muscle strengthening exercises.fall precautions. Skin care. Encourage to be out of bed.   Chronic diastolic chf Appears euvolemic on exam. Continue coreg with lasix and hydralazine for now. Monitor weight and  check bmp  HTN Stable bp, continue amlodipine, coreg, clonidine and hydralazine and monitor bp bid x 1 week  CAD Chest pain free. Continue b blocker, hydralazine and prn NTG. Continue aspirin and statin  PVD S/p left AKA. Continue aspirin  HLD Continue atorvastatin  gerd Stable, continue famotidine  ckd Monitor bmp  Anemia of chronic disease Monitor cbc  Protein calorie malnutrition Monitor po intake, continue glucerna supplement, dietary consult. Continue MVI  DM Reviewed a1c. Off all diabetic medication at present. Check cbg once a day for now. Continue asa and statin  Lab Results  Component Value Date   HGBA1C 7.1* 01/17/2016     Goals of care: short term rehabilitation   Labs/tests ordered: cbc, cmp  Family/ staff Communication: reviewed care plan with patient and nursing supervisor    Oneal Grout, MD Internal Medicine [facility] [address] [place], [place] [address] Cell Phone (Monday-Friday 8 am - 5 pm): [phone] On Call: [phone] and follow prompts after 5 pm and on weekends Office Phone: [phone] Office Fax: [phone]

## 2016-01-27 LAB — BASIC METABOLIC PANEL
BUN: 130 mg/dL — AB (ref 4–21)
CREATININE: 5.2 mg/dL — AB (ref <=1.1)
Glucose: 183 mg/dL
Potassium: 3.6 mmol/L (ref 3.4–5.3)
SODIUM: 135 mmol/L — AB (ref 137–147)

## 2016-01-27 LAB — HEPATIC FUNCTION PANEL
ALK PHOS: 45 U/L (ref 25–125)
ALT: 12 U/L (ref 7–35)
AST: 14 U/L (ref 13–35)
Bilirubin, Total: 0.3 mg/dL

## 2016-01-27 LAB — CBC AND DIFFERENTIAL
Platelets: 239 10*3/uL (ref 150–399)
WBC: 5.4 10^3/mL

## 2016-01-28 ENCOUNTER — Non-Acute Institutional Stay (SKILLED_NURSING_FACILITY): Payer: Medicare Other | Admitting: Family

## 2016-01-28 ENCOUNTER — Encounter: Payer: Self-pay | Admitting: Family

## 2016-01-28 DIAGNOSIS — E46 Unspecified protein-calorie malnutrition: Secondary | ICD-10-CM | POA: Diagnosis not present

## 2016-01-28 DIAGNOSIS — N189 Chronic kidney disease, unspecified: Secondary | ICD-10-CM

## 2016-01-28 DIAGNOSIS — D638 Anemia in other chronic diseases classified elsewhere: Secondary | ICD-10-CM

## 2016-01-28 MED ORDER — FERROUS SULFATE 325 (65 FE) MG PO TABS: 325.0000 mg | ORAL_TABLET | Freq: Every day | ORAL | Status: DC

## 2016-01-28 MED ORDER — DOCUSATE SODIUM 100 MG PO CAPS
100.0000 mg | ORAL_CAPSULE | Freq: Every day | ORAL | Status: DC
Start: 2016-01-28 — End: 2016-03-30

## 2016-01-28 NOTE — Progress Notes (Signed)
Patient ID: Adrienne Bowman, female   DOB: 1918/03/20, 82 y.o.   MRN: [id number]

## 2016-01-28 NOTE — Progress Notes (Signed)
Location:  [facility] Room Number: 1206 Place of Service:  SNF (31) Provider:  [facility] Dutch, Marletta Bousquet  NP  Ruthe Mannan, MD  Patient Care Team: Dianne Dun, MD as PCP - General Marina Gravel, MD (Nephrology)  Extended Emergency Contact Information Primary Emergency Contact: [facility] Address: [address]          [place], [place] [address] Darden Amber of [place] Home Phone: [phone] Relation: Daughter Secondary Emergency Contact: Jeri Cos Address: [address]          [place], [place] [address] Darden Amber of [place] Home Phone: [phone] Mobile Phone: [phone] Relation: Daughter  Code Status:  DNR Goals of care: Advanced Directive information Advanced Directives 01/28/2016  Does patient have an advance directive? Yes  Type of Advance Directive Out of facility DNR (pink MOST or yellow form)  Does patient want to make changes to advanced directive? No - Patient declined  Copy of advanced directive(s) in chart? -     Chief Complaint  Patient presents with  . Acute Visit    HPI:  Pt is a 82 y.o. female seen today at [facility] for follow up abnormal labs. She has a medical history of CHF, HTN, PVD, Type 2 DM, CKD, Anemia among others. She is seen in her room today. She states no acute issues. She has been working with PT/OT. He recent Labs Hgb 7.0 Protein 5.2, Alb 2.99, CR 5.20.She denies any dizziness, headache, shortness of breath or chest pain.  Facility staff  Reports no new concerns.    Past Medical History  Diagnosis Date  . CHF (congestive heart failure) (HCC) 2002    acute CHF while getting lower extremilty arteriograms /  Led to CABG  x6  . Hypertension     very difficult to control  . PVD (peripheral vascular disease) (HCC)     extensive PVD / left AKA  . Murmur, heart     soft systolic outflow murmur and a grade 2/6 murmur of the aortic insufficiency  . S/P AKA (above knee amputation) (HCC)     left AKA with prosthesis  . DIABETES MELLITUS, TYPE II   . NEPHROPATHY, DIABETIC   . Coronary artery disease 2002    CABG x6 / left internal mammary artery graft to the LAD, a saphenous vein graft to the diagonal, a sequential saphenous vein graft to the 1st and 2nd obtuse marginal branches, and a sequential saphenous vein graft the the distal right coronary artery and posterior descending  . Anemia   . CKD (chronic kidney disease)   . Advanced age    Past Surgical History  Procedure Laterality Date  . Coronary artery bypass graft  2002    x6 per Dr. Laneta Simmers  . Eye surgery  1987 & 1988     left eye  . Vascular surgery      multiple peripheral vascular surgeries  . Left aka  2006  . Right renal artery stent  2004  . Rfpbpg  2002  . Lower extremity angiogram  February 21, 2012  . Pr vein bypass graft,aorto-fem-pop  Oct. 17, 2012    Right  Fem-Distal post. Tibial artery BPG.  . Lower extremity angiogram N/A 02/21/2012    Procedure: LOWER EXTREMITY ANGIOGRAM;  Surgeon: Nada Libman, MD;  Location: [facility] CATH LAB;  Service: Cardiovascular;  Laterality: N/A;  . Lower extremity angiogram Right 12/25/2012    Procedure: LOWER EXTREMITY ANGIOGRAM;  Surgeon: Nada Libman,  MD;  Location: [facility] CATH LAB;  Service: Cardiovascular;  Laterality: Right;  rt leg angio  . Abdominal angiogram  12/25/2012    Procedure: ABDOMINAL ANGIOGRAM;  Surgeon: Nada Libman, MD;  Location: [facility] CATH LAB;  Service: Cardiovascular;;    No Known Allergies    Medication List       This list is accurate as of: 01/28/16 10:50 AM.  Always use your most recent med list.               amlodipine-atorvastatin 10-10 MG tablet  Commonly known as:  CADUET  TAKE ONE TABLET BY MOUTH ONCE DAILY     aspirin EC 81 MG tablet  Take 81 mg by mouth daily.     carvedilol 25 MG tablet  Commonly known as:  COREG  Take 1 tablet (25 mg total) by mouth 2 (two) times daily with a meal.     cloNIDine 0.3 mg/24hr patch  Commonly  known as:  CATAPRES - Dosed in mg/24 hr  Place 1 patch (0.3 mg total) onto the skin once a week.     famotidine 20 MG tablet  Commonly known as:  PEPCID  TAKE ONE TABLET BY MOUTH ONCE DAILY     furosemide 40 MG tablet  Commonly known as:  LASIX  TAKE ONE TABLET BY MOUTH ONCE DAILY     hydrALAZINE 100 MG tablet  Commonly known as:  APRESOLINE  TAKE ONE TABLET BY MOUTH THREE TIMES DAILY     multivitamin per tablet  Take 1 tablet by mouth daily.     nitroGLYCERIN 0.4 MG SL tablet  Commonly known as:  NITROSTAT  Place 1 tablet (0.4 mg total) under the tongue every 5 (five) minutes as needed for chest pain.     UNABLE TO FIND  Med Name: Med pass 120 mL by mouth twice daily between meals as supplement        Review of Systems  Constitutional: Negative for fever, chills, activity change, appetite change and fatigue.  HENT: Negative.   Eyes: Negative.   Respiratory: Negative for cough, chest tightness, shortness of breath and wheezing.   Cardiovascular: Negative for chest pain, palpitations and leg swelling.  Gastrointestinal: Negative.   Endocrine: Negative.   Genitourinary: Negative for urgency and flank pain.  Musculoskeletal: Positive for gait problem.  Skin: Negative.   Neurological: Negative for dizziness, seizures, syncope, light-headedness and headaches.  Hematological: Negative.   Psychiatric/Behavioral: Negative for hallucinations, confusion, sleep disturbance and agitation.    Immunization History  Administered Date(s) Administered  . PPD Test 01/23/2016   Pertinent  Health Maintenance Due  Topic Date Due  . FOOT EXAM  [birth year]1930  . OPHTHALMOLOGY EXAM  08/[birth year]/1930  . URINE MICROALBUMIN  08/23/1930  . DEXA SCAN  08/23/1985  . PNA vac Low Risk Adult (1 of 2 - PCV13) 01/30/2017 (Originally 08/23/1985)  . INFLUENZA VACCINE  06/22/2019 (Originally 06/15/2015)  . HEMOGLOBIN A1C  07/19/2016   No flowsheet data found. Functional Status Survey:    Filed Vitals:    01/28/16 1032  BP: 133/49  Pulse: 70  Temp: 98.4 F (36.9 C)  TempSrc: Oral  Resp: 18  Height:  (1.626 m)  Weight: 102 lb (46.267 kg)  SpO2: 97%   Body mass index is 17.5 kg/(m^2). Physical Exam  Constitutional:  Frail Elderly in no acute distress.   HENT:  Head: Normocephalic.  Mouth/Throat: Oropharynx is clear and moist.  Eyes: Conjunctivae and EOM are normal. Pupils are equal, round,  and reactive to light. Right eye exhibits no discharge. Left eye exhibits no discharge. No scleral icterus.  Neck: Normal range of motion. No JVD present. No thyromegaly present.  Cardiovascular: Normal rate, regular rhythm and intact distal pulses.  Exam reveals no gallop and no friction rub.   Murmur heard. Pulmonary/Chest: Effort normal and breath sounds normal. No respiratory distress. She has no wheezes. She has no rales.  Abdominal: Soft. Bowel sounds are normal. She exhibits no distension and no mass. There is no tenderness. There is no rebound and no guarding.  Musculoskeletal: Normal range of motion. She exhibits no edema.  Left BKA prosthetic in place.   Lymphadenopathy:    She has no cervical adenopathy.  Neurological: She is alert.  Skin: Skin is warm and dry. No rash noted. No erythema. No pallor.  Psychiatric: She has a normal mood and affect.    Labs reviewed:  Recent Labs  01/20/16 0400 01/21/16 0415 01/23/16 01/23/16 0554 01/27/16  NA 135 132* 131* 131* 135*  K 3.3* 4.0  --  3.6 3.6  CL 96* 94*  --  90*  --   CO2 23 21*  --  27  --   GLUCOSE 126* 162*  --  135*  --   BUN 111* 117* 131* 131* 130*  CREATININE 4.66* 4.85* 4.9* 4.88* 5.2*  CALCIUM 8.4* 8.2*  --  8.2*  --     Recent Labs  01/27/16  AST 14  ALT 12  ALKPHOS 45    Recent Labs  01/17/16 0600 01/17/16 2323 01/19/16 0453 01/27/16  WBC 7.3 7.3 6.6 5.4  HGB 7.9* 7.7* 7.9*  --   HCT 24.8* 24.7* 24.9*  --   MCV 84.9 84.6 84.4  --   PLT 227 214 229 239   Lab Results  Component Value Date    TSH 2.488 01/17/2016   Lab Results  Component Value Date   HGBA1C 7.1* 01/17/2016   Lab Results  Component Value Date   CHOL 116 01/17/2016   HDL 29* 01/17/2016   LDLCALC 69 01/17/2016   LDLDIRECT 59.6 08/16/2012   TRIG 91 01/17/2016   CHOLHDL 4.0 01/17/2016    Significant Diagnostic Results in last 30 days:  Dg Chest 2 View  01/18/2016  CLINICAL DATA:  82 year old who was admitted 2 days ago with CHF and has improving though persistent shortness of breath. EXAM: CHEST  2 VIEW COMPARISON:  01/17/2016 and earlier. FINDINGS: Prior sternotomy for CABG. Cardiac silhouette moderately enlarged. Severe atherosclerosis involving the thoracic and upper abdominal aorta. Interval significant improvement in the interstitial and airspace pulmonary edema, though moderate CHF persists, asymmetric and increased in the right lung. Stable small bilateral pleural effusions. No new pulmonary parenchymal abnormalities. IMPRESSION: Improving CHF, though moderate interstitial and airspace pulmonary edema persist, asymmetric and increased in the right lung. Stable small bilateral pleural effusions. No new abnormalities. Electronically Signed   By: Hulan Saashomas  Lawrence M.D.   On: 01/18/2016 17:19   Dg Chest 2 View  01/17/2016  CLINICAL DATA:  Dyspnea for 1 day EXAM: CHEST  2 VIEW COMPARISON:  01/17/2016 with the patient is status post CABG with extensive aortic calcification. Mild to moderate cardiac silhouette enlargement is stable. Central venous congestion is also FINDINGS: Unchanged. There is patchy multifocal bilateral airspace opacification. Airspace opacities are predominantly alveolar with resolving interstitial opacities. Small bilateral pleural effusions are present. IMPRESSION: Findings appear most consistent with evolving pulmonary edema with mild improvement. Radiographic follow-up suggested to monitor, as the possibility  of infectious infiltrate superimposed on pulmonary edema is not excluded. Electronically  Signed   By: Esperanza Heir M.D.   On: 01/17/2016 12:06   Dg Chest Port 1 View  01/17/2016  CLINICAL DATA:  82 year old female with chest pain and shortness of breath EXAM: PORTABLE CHEST 1 VIEW COMPARISON:  Radiograph dated 01/16/2016 FINDINGS: There is diffuse interstitial prominence with bilateral mid to lower lung field interstitial and airspace hazy densities. Overall there has been interval progression of the airspace densities since the prior study most compatible with pneumonia. There is no significant pleural effusion. No pneumothorax. The cardiac borders are silhouetted. Median sternotomy wires and CABG vascular clips noted. The aorta is tortuous. There is atherosclerotic calcification of the visualized aorta. No acute osseous pathology identified. IMPRESSION: Interval progression of the bilateral airspace opacities. Follow-up recommended. Electronically Signed   By: Elgie Collard M.D.   On: 01/17/2016 00:58   Dg Chest Port 1 View  01/16/2016  CLINICAL DATA:  82 year old female with shortness of breath EXAM: PORTABLE CHEST 1 VIEW COMPARISON:  Radiograph dated 07/12/2015 FINDINGS: Single portable view of the chest demonstrate emphysematous changes of the lungs. There is diffuse interstitial prominence with areas of hazy and nodular airspace opacity predominantly involving the mid to lower lung fields. Findings most compatible with worsening interstitial edema with possible superimposed infection. Clinical correlation recommended. No significant pleural effusion identified. There is no pneumothorax. Stable cardiac silhouette. Median sternotomy wires and CABG vascular clips noted. There is osteopenia with degenerative changes of the spine. No acute fracture. IMPRESSION: Interval worsening of the pulmonary edema with possible superimposed infection. Clinical correlation is recommended. Electronically Signed   By: Elgie Collard M.D.   On: 01/16/2016 19:53    Assessment/Plan Anemia of chronic  disease  Asymptomatic.Recent Hgb 7.0 Start Ferrous 325 mg Tablet daily. Continue to monitor CBC  CKD CR at baseline. Continue to monitor BMP.   Protein Calorie Malnutrition  Recent lab results total Protein 5.2, Alb 2.99. Has RD consult. Continue on Med pass.       Family/ staff Communication:Reviewed plan of care with patient and facility Nurse supervisor.   Labs/tests ordered: CBC, BMP 02/04/2016

## 2016-01-28 NOTE — Telephone Encounter (Signed)
Transition Care Management Follow-up Telephone Call     Date discharged? 01/23/16        How have you been since you were released from the hospital? Health status improving   Any patient concerns? None   Do you understand why you were in the hospital? Yes   Do you understand the discharge instructions? Yes   Where were you discharged to? Home   Items Reviewed:  Medications reviewed: Yes  Allergies reviewed: Yes  Dietary changes reviewed: Yes  Referrals reviewed: Yes   Functional Questionnaire: I - Independent D- Dependent     Activities of Daily Living (ADLs):    Personal hygiene - I Dressing - I Eating - I Maintaining continence - D (wears incontinence briefs)  Transferring - I  Independent Activities of Daily Living (ADLs): Basic communication skills - I Transportation - D (children) Meal preparation - D (daughter) Shopping - D (children) Housework - D (daughter)  Managing medications - D (daughter) Managing personal finances - D (children)   Confirmed importance and date/time of follow-up visits scheduled YES  Provider Appointment booked with PCP 02/01/16 @ 4PM  Confirmed with patient if condition begins to worsen call PCP or go to the ER.  Patient was given the office number and encouraged to call back with question or concerns: YES

## 2016-02-01 ENCOUNTER — Encounter: Payer: Self-pay | Admitting: Family Medicine

## 2016-02-01 ENCOUNTER — Ambulatory Visit (INDEPENDENT_AMBULATORY_CARE_PROVIDER_SITE_OTHER): Payer: Medicare Other | Admitting: Family Medicine

## 2016-02-01 VITALS — BP 126/42 | HR 63 | Temp 97.6°F

## 2016-02-01 DIAGNOSIS — D649 Anemia, unspecified: Secondary | ICD-10-CM

## 2016-02-01 DIAGNOSIS — E43 Unspecified severe protein-calorie malnutrition: Secondary | ICD-10-CM

## 2016-02-01 DIAGNOSIS — I739 Peripheral vascular disease, unspecified: Secondary | ICD-10-CM

## 2016-02-01 DIAGNOSIS — N189 Chronic kidney disease, unspecified: Secondary | ICD-10-CM | POA: Diagnosis not present

## 2016-02-01 DIAGNOSIS — E785 Hyperlipidemia, unspecified: Secondary | ICD-10-CM

## 2016-02-01 DIAGNOSIS — Z89612 Acquired absence of left leg above knee: Secondary | ICD-10-CM | POA: Diagnosis not present

## 2016-02-01 DIAGNOSIS — I5033 Acute on chronic diastolic (congestive) heart failure: Secondary | ICD-10-CM

## 2016-02-01 DIAGNOSIS — N179 Acute kidney failure, unspecified: Secondary | ICD-10-CM

## 2016-02-01 DIAGNOSIS — I10 Essential (primary) hypertension: Secondary | ICD-10-CM

## 2016-02-01 NOTE — Progress Notes (Signed)
Subjective:   Patient ID: Adrienne Bowman, female    DOB: 07/30/1918, 82 y.o.   MRN: [id number]  Adrienne Bowman is a pleasant 82 y.o. year old female who presents to clinic today with Hospitalization Follow-up  on 02/01/2016  HPI: Adrienne Bowman is an 82 y.o. female past medical history chronic systolic heart failure with an EF of 45%, no presented to the ED with dyspnea and left-sided chest pain that started the morning of admission.  Notes reviewed- admitted 01/16/16 and discharged 01/23/16. Went to rehab at [facility] after discharge.  She is still at [facility].  Diagnosed with acute on chronic CHF- advised continued lasix 40 mg daily and continued coreg and norvasc.  Acute on Chronic renal failure- Cr at [facility] prior to d/c from SNF. Lab Results  Component Value Date   CREATININE 5.2* 01/27/2016     HTN- has been difficult to control for years.  Cardiology was consulted and clonidine was increased.  BP is lower today than I have ever seen but she denies feeling dizzy.  Anemia of chronic disease- taking oral ferrous sulfate. Lab Results  Component Value Date   WBC 5.4 01/27/2016   HGB 7.9* 01/19/2016   HCT 24.9* 01/19/2016   MCV 84.4 01/19/2016   PLT 239 01/27/2016   Was started on medpass at [facility].  Current Outpatient Prescriptions on File Prior to Visit  Medication Sig Dispense Refill  . amlodipine-atorvastatin (CADUET) 10-10 MG tablet TAKE ONE TABLET BY MOUTH ONCE DAILY 90 tablet 2  . aspirin EC 81 MG tablet Take 81 mg by mouth daily.    . carvedilol (COREG) 25 MG tablet Take 1 tablet (25 mg total) by mouth 2 (two) times daily with a meal. (Patient taking differently: Take 25 mg by mouth 2 (two) times daily with a meal. For CHF) 180 tablet 3  . cloNIDine (CATAPRES - DOSED IN MG/24 HR) 0.3 mg/24hr patch Place 1 patch (0.3 mg total) onto the skin once a week. 7 patch 0  . docusate sodium (COLACE) 100 MG capsule Take 1 capsule (100 mg total) by mouth daily. 30 capsule 0    . famotidine (PEPCID) 20 MG tablet TAKE ONE TABLET BY MOUTH ONCE DAILY 30 tablet 5  . ferrous sulfate 325 (65 FE) MG tablet Take 1 tablet (325 mg total) by mouth daily with breakfast. 30 tablet 3  . furosemide (LASIX) 40 MG tablet TAKE ONE TABLET BY MOUTH ONCE DAILY 30 tablet 5  . hydrALAZINE (APRESOLINE) 100 MG tablet TAKE ONE TABLET BY MOUTH THREE TIMES DAILY 90 tablet 6  . multivitamin (THERAGRAN) per tablet Take 1 tablet by mouth daily.      . nitroGLYCERIN (NITROSTAT) 0.4 MG SL tablet Place 1 tablet (0.4 mg total) under the tongue every 5 (five) minutes as needed for chest pain. 30 tablet 0  . UNABLE TO FIND Med Name: Med pass 120 mL by mouth twice daily between meals as supplement     No current facility-administered medications on file prior to visit.    No Known Allergies  Past Medical History  Diagnosis Date  . CHF (congestive heart failure) (HCC) 2002    acute CHF while getting lower extremilty arteriograms /  Led to CABG  x6  . Hypertension     very difficult to control  . PVD (peripheral vascular disease) (HCC)     extensive PVD / left AKA  . Murmur, heart     soft systolic outflow murmur and a  grade 2/6 murmur of the aortic insufficiency  . S/P AKA (above knee amputation) (HCC)     left AKA with prosthesis  . DIABETES MELLITUS, TYPE II   . NEPHROPATHY, DIABETIC   . Coronary artery disease 2002    CABG x6 / left internal mammary artery graft to the LAD, a saphenous vein graft to the diagonal, a sequential saphenous vein graft to the 1st and 2nd obtuse marginal branches, and a sequential saphenous vein graft the the distal right coronary artery and posterior descending  . Anemia   . CKD (chronic kidney disease)   . Advanced age     Past Surgical History  Procedure Laterality Date  . Coronary artery bypass graft  2002    x6 per Dr. Laneta Simmers  . Eye surgery  1987 & 1988     left eye  . Vascular surgery      multiple peripheral vascular surgeries  . Left aka  2006  .  Right renal artery stent  2004  . Rfpbpg  2002  . Lower extremity angiogram  February 21, 2012  . Pr vein bypass graft,aorto-fem-pop  Oct. 17, 2012    Right  Fem-Distal post. Tibial artery BPG.  . Lower extremity angiogram N/A 02/21/2012    Procedure: LOWER EXTREMITY ANGIOGRAM;  Surgeon: Nada Libman, MD;  Location: [facility] CATH LAB;  Service: Cardiovascular;  Laterality: N/A;  . Lower extremity angiogram Right 12/25/2012    Procedure: LOWER EXTREMITY ANGIOGRAM;  Surgeon: Nada Libman, MD;  Location: [facility] CATH LAB;  Service: Cardiovascular;  Laterality: Right;  rt leg angio  . Abdominal angiogram  12/25/2012    Procedure: ABDOMINAL ANGIOGRAM;  Surgeon: Nada Libman, MD;  Location: [facility] CATH LAB;  Service: Cardiovascular;;    Family History  Problem Relation Age of Onset  . Coronary artery disease      prevalent in sibling  . Heart disease Mother   . Hypertension Daughter   . Cancer Son     Lung  and  Throat  . Heart attack Son   . Hyperlipidemia Daughter   . Hypertension Daughter   . Hypertension Daughter   . Hypertension Daughter     Social History   Social History  . Marital Status: Widowed    Spouse Name: N/A  . Number of Children: N/A  . Years of Education: N/A   Occupational History  . Not on file.   Social History Main Topics  . Smoking status: Never Smoker   . Smokeless tobacco: Never Used  . Alcohol Use: No  . Drug Use: No  . Sexual Activity: No   Other Topics Concern  . Not on file   Social History Narrative   The PMH, PSH, Social History, Family History, Medications, and allergies have been reviewed in [facility], and have been updated if relevant.   Review of Systems  Constitutional: Negative.   Respiratory: Negative.   Cardiovascular: Negative.   Gastrointestinal: Negative.   Musculoskeletal: Negative.   Skin: Negative.   Hematological: Negative.   Psychiatric/Behavioral: Negative.   All other systems reviewed and are negative.      Objective:    BP  126/42 mmHg  Pulse 63  Temp(Src) 97.6 F (36.4 C) (Oral)  Wt   SpO2 98%   Physical Exam  Constitutional: She is oriented to person, place, and time. No distress.  HENT:  Head: Normocephalic.  Eyes: Conjunctivae are normal.  Neck: Normal range of motion.  Cardiovascular: Normal rate.  Pulmonary/Chest: Effort normal.  Musculoskeletal:   no LEE. Left leg AKA  Neurological: She is alert and oriented to person, place, and time.  Skin: Skin is warm and dry. She is not diaphoretic.  Psychiatric: She has a normal mood and affect. Her behavior is normal. Thought content normal.  Nursing note and vitals reviewed.       Assessment & Plan:   S/P AKA (above knee amputation) unilateral, left (HCC)  Peripheral vascular disease, unspecified (HCC)  Protein-calorie malnutrition, severe (HCC)  HLD (hyperlipidemia)  Acute on chronic diastolic (congestive) heart failure (HCC)  Acute on chronic renal failure (HCC) No Follow-up on file.

## 2016-02-01 NOTE — Progress Notes (Signed)
Pre visit review using our clinic review tool, if applicable. No additional management support is needed unless otherwise documented below in the visit note. 

## 2016-02-01 NOTE — Assessment & Plan Note (Signed)
Lower than I have ever seen her BP but she denies dizziness. Wheelchair bound, will need to monitor this at SNF.

## 2016-02-01 NOTE — Assessment & Plan Note (Signed)
Continue oral iron. Recheck CBC.

## 2016-02-01 NOTE — Assessment & Plan Note (Signed)
No signs of volume overload. Recheck kidney function today. Keep F/u with cardiology. No changes made to rxs.

## 2016-02-02 ENCOUNTER — Telehealth: Payer: Self-pay | Admitting: Radiology

## 2016-02-02 LAB — CBC WITH DIFFERENTIAL/PLATELET
Basophils Absolute: 0 10*3/uL (ref 0.0–0.1)
Basophils Relative: 0.4 % (ref 0.0–3.0)
EOS PCT: 2.6 % (ref 0.0–5.0)
Eosinophils Absolute: 0.1 10*3/uL (ref 0.0–0.7)
LYMPHS ABS: 0.7 10*3/uL (ref 0.7–4.0)
Lymphocytes Relative: 16 % (ref 12.0–46.0)
MCHC: 32.8 g/dL (ref 30.0–36.0)
MCV: 84.8 fl (ref 78.0–100.0)
MONOS PCT: 9.5 % (ref 3.0–12.0)
Monocytes Absolute: 0.4 10*3/uL (ref 0.1–1.0)
Neutro Abs: 3.3 10*3/uL (ref 1.4–7.7)
Neutrophils Relative %: 71.5 % (ref 43.0–77.0)
Platelets: 187 10*3/uL (ref 150.0–400.0)
RBC: 2.75 Mil/uL — AB (ref 3.87–5.11)
RDW: 14.7 % (ref 11.5–15.5)
WBC: 4.6 10*3/uL (ref 4.0–10.5)

## 2016-02-02 LAB — COMPREHENSIVE METABOLIC PANEL
ALBUMIN: 3.4 g/dL — AB (ref 3.5–5.2)
ALK PHOS: 48 U/L (ref 39–117)
ALT: 11 U/L (ref 0–35)
AST: 16 U/L (ref 0–37)
BUN: 138 mg/dL (ref 6–23)
CALCIUM: 8.9 mg/dL (ref 8.4–10.5)
CO2: 29 mEq/L (ref 19–32)
Chloride: 95 mEq/L — ABNORMAL LOW (ref 96–112)
Creatinine, Ser: 4.59 mg/dL (ref 0.40–1.20)
GFR: 11.4 mL/min — AB (ref >60.00)
Glucose, Bld: 159 mg/dL — ABNORMAL HIGH (ref 70–99)
POTASSIUM: 4.3 meq/L (ref 3.5–5.1)
Sodium: 134 mEq/L — ABNORMAL LOW (ref 135–145)
TOTAL PROTEIN: 6.6 g/dL (ref 6.0–8.3)
Total Bilirubin: 0.4 mg/dL (ref 0.2–1.2)

## 2016-02-02 NOTE — Telephone Encounter (Signed)
[facility] called critical lab results, BUN - 138, CRT - 4.59, GFR - 11.4, HGB - 7.7, HCT - 23.3. Results given to Dr Dayton MartesAron

## 2016-02-09 ENCOUNTER — Non-Acute Institutional Stay (SKILLED_NURSING_FACILITY): Payer: Medicare Other | Admitting: Family

## 2016-02-09 DIAGNOSIS — N184 Chronic kidney disease, stage 4 (severe): Secondary | ICD-10-CM | POA: Diagnosis not present

## 2016-02-09 DIAGNOSIS — E43 Unspecified severe protein-calorie malnutrition: Secondary | ICD-10-CM | POA: Diagnosis not present

## 2016-02-09 DIAGNOSIS — I2510 Atherosclerotic heart disease of native coronary artery without angina pectoris: Secondary | ICD-10-CM

## 2016-02-09 DIAGNOSIS — Z7409 Other reduced mobility: Secondary | ICD-10-CM | POA: Diagnosis not present

## 2016-02-09 DIAGNOSIS — E785 Hyperlipidemia, unspecified: Secondary | ICD-10-CM

## 2016-02-09 DIAGNOSIS — I5032 Chronic diastolic (congestive) heart failure: Secondary | ICD-10-CM

## 2016-02-09 NOTE — Progress Notes (Signed)
Patient ID: Adrienne Bowman, female   DOB: Jun 06, 1918, 82 y.o.   MRN: [id number]  Location:  [facility]   Place of Service:  SNF (31)  Provider:Mahima Glade Lloyd, MD   PCP: Ruthe Mannan, MD Patient Care Team: Dianne Dun, MD as PCP - General Marina Gravel, MD (Nephrology)  Extended Emergency Contact Information Primary Emergency Contact: [facility] Address: [address]          [place], [place] [address] Darden [place] of [place] Home Phone: [phone] Relation: Daughter Secondary Emergency Contact: Jeri Cos Address: [address]          [place], [place] [address] Darden [place] of [place] Home Phone: [phone] Mobile Phone: [phone] Relation: Daughter  Code Status:DNR  Goals of care:  Advanced Directive information Advanced Directives 01/28/2016  Does patient have an advance directive? Yes  Type of Advance Directive Out of facility DNR (pink MOST or yellow form)  Does patient want to make changes to advanced directive? No - Patient declined  Copy of advanced directive(s) in chart? -     No Known Allergies  Chief Complaint  Patient presents with  . Discharge Note    Home     HPI:  82 y.o. female seen today at [facility] for discharge home. She has been here for for short term rehabilitation post hospital admission from 01/16/16-01/23/16 with acute on chronic diastolic heart failure. She was treated with diuresis. She has a medical history of HTN, Type 2 DM, CKD stage 4,hyperlipidemia, PVD among others. She is seen in her room today reading her bible. She states feeling much better than when she came to the facility. She denies any acute issues this visit. She has worked with PT/OT now stable for discharge home to continue with [facility] PT/OT for ROM, exercise, gait stability and muscle strengthening. She will also require a HH Aid to assist with ADL's. She does not require any DME.     Past Medical History  Diagnosis Date  . CHF  (congestive heart failure) (HCC) 2002    acute CHF while getting lower extremilty arteriograms /  Led to CABG  x6  . Hypertension     very difficult to control  . PVD (peripheral vascular disease) (HCC)     extensive PVD / left AKA  . Murmur, heart     soft systolic outflow murmur and a grade 2/6 murmur of the aortic insufficiency  . S/P AKA (above knee amputation) (HCC)     left AKA with prosthesis  . DIABETES MELLITUS, TYPE II   . NEPHROPATHY, DIABETIC   . Coronary artery disease 2002    CABG x6 / left internal mammary artery graft to the LAD, a saphenous vein graft to the diagonal, a sequential saphenous vein graft to the 1st and 2nd obtuse marginal branches, and a sequential saphenous vein graft the the distal right coronary artery and posterior descending  . Anemia   . CKD (chronic kidney disease)   . Advanced age     Past Surgical History  Procedure Laterality Date  . Coronary artery bypass graft  2002    x6 per Dr. Laneta Simmers  . Eye surgery  1987 & 1988     left eye  . Vascular surgery      multiple peripheral vascular surgeries  . Left aka  2006  . Right renal artery stent  2004  . Rfpbpg  2002  . Lower extremity angiogram  February 21, 2012  .  Pr vein bypass graft,aorto-fem-pop  Oct. 17, 2012    Right  Fem-Distal post. Tibial artery BPG.  . Lower extremity angiogram N/A 02/21/2012    Procedure: LOWER EXTREMITY ANGIOGRAM;  Surgeon: Nada LibmanVance W Brabham, MD;  [facility];  Service: Cardiovascular;  Laterality: N/A;  . Lower extremity angiogram Right 12/25/2012    Procedure: LOWER EXTREMITY ANGIOGRAM;  Surgeon: Nada LibmanVance W Brabham, MD;  [facility];  Service: Cardiovascular;  Laterality: Right;  rt leg angio  . Abdominal angiogram  12/25/2012    Procedure: ABDOMINAL ANGIOGRAM;  Surgeon: Nada LibmanVance W Brabham, MD;  [facility];  Service: Cardiovascular;;      reports that she has never smoked. She has never used smokeless tobacco. She reports that she does not drink  alcohol or use illicit drugs. Social History   Social History  . Marital Status: Widowed    Spouse Name: N/A  . Number of Children: N/A  . Years of Education: N/A   Occupational History  . Not on file.   Social History Main Topics  . Smoking status: Never Smoker   . Smokeless tobacco: Never Used  . Alcohol Use: No  . Drug Use: No  . Sexual Activity: No   Other Topics Concern  . Not on file   Social History Narrative   Functional Status Survey:    No Known Allergies  Pertinent  Health Maintenance Due  Topic Date Due  . FOOT EXAM  08/[birth year]1930  . OPHTHALMOLOGY EXAM  08/23/1930  . URINE MICROALBUMIN  08/23/1930  . DEXA SCAN  08/23/1985  . PNA vac Low Risk Adult (1 of 2 - PCV13) 01/30/2017 (Originally 08/23/1985)  . INFLUENZA VACCINE  06/22/2019 (Originally 06/15/2015)  . HEMOGLOBIN A1C  07/19/2016    Medications:   Medication List       This list is accurate as of: 02/09/16  4:56 PM.  Always use your most recent med list.               amLODipine 10 MG tablet  Commonly known as:  NORVASC  Take 10 mg by mouth daily.     aspirin EC 81 MG tablet  Take 81 mg by mouth daily.     atorvastatin 10 MG tablet  Commonly known as:  LIPITOR  Take 10 mg by mouth daily.     carvedilol 25 MG tablet  Commonly known as:  COREG  Take 1 tablet (25 mg total) by mouth 2 (two) times daily with a meal.     cloNIDine 0.3 mg/24hr patch  Commonly known as:  CATAPRES - Dosed in mg/24 hr  Place 1 patch (0.3 mg total) onto the skin once a week.     docusate sodium 100 MG capsule  Commonly known as:  COLACE  Take 1 capsule (100 mg total) by mouth daily.     famotidine 20 MG tablet  Commonly known as:  PEPCID  TAKE ONE TABLET BY MOUTH ONCE DAILY     ferrous sulfate 325 (65 FE) MG tablet  Take 1 tablet (325 mg total) by mouth daily with breakfast.     furosemide 40 MG tablet  Commonly known as:  LASIX  TAKE ONE TABLET BY MOUTH ONCE DAILY     hydrALAZINE 100 MG tablet    Commonly known as:  APRESOLINE  TAKE ONE TABLET BY MOUTH THREE TIMES DAILY     multivitamin per tablet  Take 1 tablet by mouth daily.     nitroGLYCERIN 0.4  MG SL tablet  Commonly known as:  NITROSTAT  Place 1 tablet (0.4 mg total) under the tongue every 5 (five) minutes as needed for chest pain.     UNABLE TO FIND  Med Name: Med pass 120 mL by mouth twice daily between meals as supplement        Review of Systems  Constitutional: Negative for fever, chills, activity change, appetite change and fatigue.  HENT: Negative.   Eyes: Negative.   Respiratory: Negative for cough, chest tightness, shortness of breath and wheezing.   Cardiovascular: Negative for chest pain, palpitations and leg swelling.  Gastrointestinal: Negative.   Endocrine: Negative.   Genitourinary: Negative for urgency and flank pain.  Musculoskeletal: Positive for gait problem.  Skin: Negative.   Neurological: Negative for dizziness, seizures, syncope, light-headedness and headaches.  Hematological: Negative.   Psychiatric/Behavioral: Negative for hallucinations, confusion, sleep disturbance and agitation.    Filed Vitals:   02/09/16 1640  BP: 112/63  Pulse: 67  Temp: 98 F (36.7 C)  Resp: 18  Height:  (1.626 m)  Weight: 89 lb 14.4 oz (40.778 kg)  SpO2: 96%   Body mass index is 15.42 kg/(m^2). Physical Exam  Constitutional: She is oriented to person, place, and time.  Elderly Thin in no acute distress.   HENT:  Head: Normocephalic.  Right Ear: External ear normal.  Left Ear: External ear normal.  Mouth/Throat: Oropharynx is clear and moist.  Eyes: Conjunctivae and EOM are normal. Pupils are equal, round, and reactive to light. Right eye exhibits no discharge. Left eye exhibits no discharge. No scleral icterus.  Neck: Normal range of motion. No JVD present. No thyromegaly present.  Cardiovascular: Normal rate, regular rhythm and intact distal pulses.  Exam reveals no gallop and no friction  rub.   Murmur heard. Pulmonary/Chest: Effort normal and breath sounds normal. No respiratory distress. She has no wheezes. She has no rales.  Abdominal: Soft. Bowel sounds are normal. She exhibits no distension and no mass. There is no tenderness. There is no rebound and no guarding.  Musculoskeletal: She exhibits no edema or tenderness.   Normal ROM except Left BKA.Prosthetic in place.   Lymphadenopathy:    She has no cervical adenopathy.  Neurological: She is oriented to person, place, and time.  Skin: Skin is warm and dry. No rash noted. No erythema. No pallor.  Psychiatric: She has a normal mood and affect.    Labs reviewed: Basic Metabolic Panel:  Recent Labs  16/10/96 0415  01/23/16 0554 01/27/16 02/01/16 1621  NA 132*  < > 131* 135* 134*  K 4.0  --  3.6 3.6 4.3  CL 94*  --  90*  --  95*  CO2 21*  --  27  --  29  GLUCOSE 162*  --  135*  --  159*  BUN 117*  < > 131* 130* 138*  CREATININE 4.85*  < > 4.88* 5.2* 4.59*  CALCIUM 8.2*  --  8.2*  --  8.9  < > = values in this interval not displayed. Liver Function Tests:  Recent Labs  01/27/16 02/01/16 1621  AST 14 16  ALT 12 11  ALKPHOS 45 48  BILITOT  --  0.4  PROT  --  6.6  ALBUMIN  --  3.4*   No results for input(s): LIPASE, AMYLASE in the last 8760 hours. No results for input(s): AMMONIA in the last 8760 hours. CBC:  Recent Labs  01/17/16 2323 01/19/16 0453 01/27/16 02/01/16 1621  WBC 7.3 6.6 5.4 4.6  NEUTROABS  --   --   --  3.3  HGB 7.7* 7.9*  --  7.7 Repeated and verified X2.*  HCT 24.7* 24.9*  --  23.3 Repeated and verified X2.*  MCV 84.6 84.4  --  84.8  PLT 214 229 239 187.0   Cardiac Enzymes:  Recent Labs  01/17/16 0600 01/17/16 1154 01/17/16 1815  TROPONINI 0.70*  0.80* 0.99* 0.99*   BNP: Invalid input(s): POCBNP CBG:  Recent Labs  01/22/16 2003 01/23/16 0754 01/23/16 1159  GLUCAP 173* 142* 244*    Procedures and Imaging Studies During Stay: Dg Chest 2 View  01/18/2016   CLINICAL DATA:  82 year old who was admitted 2 days ago with CHF and has improving though persistent shortness of breath. EXAM: CHEST  2 VIEW COMPARISON:  01/17/2016 and earlier. FINDINGS: Prior sternotomy for CABG. Cardiac silhouette moderately enlarged. Severe atherosclerosis involving the thoracic and upper abdominal aorta. Interval significant improvement in the interstitial and airspace pulmonary edema, though moderate CHF persists, asymmetric and increased in the right lung. Stable small bilateral pleural effusions. No new pulmonary parenchymal abnormalities. IMPRESSION: Improving CHF, though moderate interstitial and airspace pulmonary edema persist, asymmetric and increased in the right lung. Stable small bilateral pleural effusions. No new abnormalities. Electronically Signed   By: Hulan Saas M.D.   On: 01/18/2016 17:19   Dg Chest 2 View  01/17/2016  CLINICAL DATA:  Dyspnea for 1 day EXAM: CHEST  2 VIEW COMPARISON:  01/17/2016 with the patient is status post CABG with extensive aortic calcification. Mild to moderate cardiac silhouette enlargement is stable. Central venous congestion is also FINDINGS: Unchanged. There is patchy multifocal bilateral airspace opacification. Airspace opacities are predominantly alveolar with resolving interstitial opacities. Small bilateral pleural effusions are present. IMPRESSION: Findings appear most consistent with evolving pulmonary edema with mild improvement. Radiographic follow-up suggested to monitor, as the possibility of infectious infiltrate superimposed on pulmonary edema is not excluded. Electronically Signed   By: Esperanza Heir M.D.   On: 01/17/2016 12:06   Dg Chest Port 1 View  01/17/2016  CLINICAL DATA:  82 year old female with chest pain and shortness of breath EXAM: PORTABLE CHEST 1 VIEW COMPARISON:  Radiograph dated 01/16/2016 FINDINGS: There is diffuse interstitial prominence with bilateral mid to lower lung field interstitial and airspace hazy  densities. Overall there has been interval progression of the airspace densities since the prior study most compatible with pneumonia. There is no significant pleural effusion. No pneumothorax. The cardiac borders are silhouetted. Median sternotomy wires and CABG vascular clips noted. The aorta is tortuous. There is atherosclerotic calcification of the visualized aorta. No acute osseous pathology identified. IMPRESSION: Interval progression of the bilateral airspace opacities. Follow-up recommended. Electronically Signed   By: Elgie Collard M.D.   On: 01/17/2016 00:58   Dg Chest Port 1 View  01/16/2016  CLINICAL DATA:  82 year old female with shortness of breath EXAM: PORTABLE CHEST 1 VIEW COMPARISON:  Radiograph dated 07/12/2015 FINDINGS: Single portable view of the chest demonstrate emphysematous changes of the lungs. There is diffuse interstitial prominence with areas of hazy and nodular airspace opacity predominantly involving the mid to lower lung fields. Findings most compatible with worsening interstitial edema with possible superimposed infection. Clinical correlation recommended. No significant pleural effusion identified. There is no pneumothorax. Stable cardiac silhouette. Median sternotomy wires and CABG vascular clips noted. There is osteopenia with degenerative changes of the spine. No acute fracture. IMPRESSION: Interval worsening of the pulmonary edema with possible superimposed  infection. Clinical correlation is recommended. Electronically Signed   By: Elgie Collard M.D.   On: 01/16/2016 19:53    Assessment/Plan:   1. Coronary artery disease involving native coronary artery of native heart without angina pectoris Chest pain free. Continue on ASA and Atorvastatin   2. Chronic diastolic congestive heart failure (HCC) No weight gain. Exam findings negative for edema, shortness of breath or wheezing. Continue Amlodipine, coreg and lasix. Monitor weight.    3. Chronic kidney disease,  stage IV (severe) (HCC) Continue to monitor. No further workup due to advance age. Avoid nephrotoxins and dose all other meds for renal clearances.   4. Hyperlipidemia Continue on Atorvastatin. PCP to monitor lipid panel.    5. Protein-calorie malnutrition, severe (HCC) Current wt 89.9 pounds down from 102 pounds. Continue on protein supplements.   6. Impaired mobility Much improvement with PT/OT will discharge home to continue with [facility] PT/OT for ROM, exercise, gait stability and muscle strengthening. She will also require a HH Aid to assist with ADL's. She does not require any DME. Fall and safety precaution.    Patient is being discharged with the following home health services:    PT/OT for ROM, exercise, gait stability and muscle strengthening.   HH Aid to assist with ADL's.    Patient is being discharged with the following durable medical equipment:   She does not require any DME.   Rx written x 1 month supply.   Patient has been advised to f/u with their PCP in 1-2 weeks to bring them up to date on their rehab stay.  Social services at facility was responsible for arranging this appointment.  Pt was provided with a 30 day supply of prescriptions for medications and refills must be obtained from their PCP.  For controlled substances, a more limited supply may be provided adequate until PCP appointment only.  Future labs/tests needed: CBC, BMP with PCP

## 2016-02-24 ENCOUNTER — Ambulatory Visit: Payer: Medicare Other | Admitting: Family Medicine

## 2016-03-14 ENCOUNTER — Encounter: Payer: Self-pay | Admitting: Family Medicine

## 2016-03-14 ENCOUNTER — Ambulatory Visit (INDEPENDENT_AMBULATORY_CARE_PROVIDER_SITE_OTHER): Payer: Medicare Other | Admitting: Family Medicine

## 2016-03-14 VITALS — BP 168/40 | HR 63 | Temp 98.0°F | Ht 65.0 in | Wt 105.1 lb

## 2016-03-14 DIAGNOSIS — E43 Unspecified severe protein-calorie malnutrition: Secondary | ICD-10-CM

## 2016-03-14 DIAGNOSIS — I2510 Atherosclerotic heart disease of native coronary artery without angina pectoris: Secondary | ICD-10-CM | POA: Diagnosis not present

## 2016-03-14 DIAGNOSIS — I5032 Chronic diastolic (congestive) heart failure: Secondary | ICD-10-CM

## 2016-03-14 NOTE — Assessment & Plan Note (Signed)
Acute on chronic. Asymptomatic and lungs clear s/p diuresis.

## 2016-03-14 NOTE — Progress Notes (Signed)
Pre visit review using our clinic review tool, if applicable. No additional management support is needed unless otherwise documented below in the visit note. 

## 2016-03-14 NOTE — Assessment & Plan Note (Signed)
Continue current rxs. No changes made today. 

## 2016-03-14 NOTE — Progress Notes (Signed)
Subjective:   Patient ID: Adrienne Bowman, female    DOB: 1918/02/19, 82 y.o.   MRN: [id number]  Adrienne Bowman is a pleasant 82 y.o. year old female who presents to clinic today with Follow-up  on 03/14/2016  HPI:  Admitted to [facility] 3/4- 3/11/7- notes reviewed for acute on chronic diastolic heart failure.  Notes reviewed.  After being diuresed, [facility]. Discharged from [facility] on 02/09/16.  She discharged home with 3 weeks of HH PT/OT.  She says today she is doing "pretty good."  Appetite is good. BP is elevated but this is not new for her.  She is asymptomatic.  No falls. Daughter has no concerns today.  She has no further SOB since she was diuresed in the hospital.  Current Outpatient Prescriptions on File Prior to Visit  Medication Sig Dispense Refill  . amLODipine (NORVASC) 10 MG tablet Take 10 mg by mouth daily.    [name] aspirin EC 81 MG tablet Take 81 mg by mouth daily.    [name] atorvastatin (LIPITOR) 10 MG tablet Take 10 mg by mouth daily.    . carvedilol (COREG) 25 MG tablet Take 1 tablet (25 mg total) by mouth 2 (two) times daily with a meal. (Patient taking differently: Take 25 mg by mouth 2 (two) times daily with a meal. For CHF) 180 tablet 3  . cloNIDine (CATAPRES - DOSED IN MG/24 HR) 0.3 mg/24hr patch Place 1 patch (0.3 mg total) onto the skin once a week. 7 patch 0  . docusate sodium (COLACE) 100 MG capsule Take 1 capsule (100 mg total) by mouth daily. 30 capsule 0  . famotidine (PEPCID) 20 MG tablet TAKE ONE TABLET BY MOUTH ONCE DAILY 30 tablet 5  . ferrous sulfate 325 (65 FE) MG tablet Take 1 tablet (325 mg total) by mouth daily with breakfast. 30 tablet 3  . furosemide (LASIX) 40 MG tablet TAKE ONE TABLET BY MOUTH ONCE DAILY 30 tablet 5  . hydrALAZINE (APRESOLINE) 100 MG tablet TAKE ONE TABLET BY MOUTH THREE TIMES DAILY 90 tablet 6  . multivitamin (THERAGRAN) per tablet Take 1 tablet by mouth daily.      . nitroGLYCERIN  (NITROSTAT) 0.4 MG SL tablet Place 1 tablet (0.4 mg total) under the tongue every 5 (five) minutes as needed for chest pain. 30 tablet 0  . UNABLE TO FIND Med Name: Med pass 120 mL by mouth twice daily between meals as supplement     No current facility-administered medications on file prior to visit.    No Known Allergies  Past Medical History  Diagnosis Date  . CHF (congestive heart failure) (HCC) 2002    acute CHF while getting lower extremilty arteriograms /  Led to CABG  x6  . Hypertension     very difficult to control  . PVD (peripheral vascular disease) (HCC)     extensive PVD / left AKA  . Murmur, heart     soft systolic outflow murmur and a grade 2/6 murmur of the aortic insufficiency  . S/P AKA (above knee amputation) (HCC)     left AKA with prosthesis  . DIABETES MELLITUS, TYPE II   . NEPHROPATHY, DIABETIC   . Coronary artery disease 2002    CABG x6 / left internal mammary artery graft to the LAD, a saphenous vein graft to the diagonal, a sequential saphenous vein graft to the 1st and 2nd obtuse marginal branches, and a sequential saphenous vein graft  the the distal right coronary artery and posterior descending  . Anemia   . CKD (chronic kidney disease)   . Advanced age     Past Surgical History  Procedure Laterality Date  . Coronary artery bypass graft  2002    x6 per Dr. Laneta Simmers  . Eye surgery  1987 & 1988     left eye  . Vascular surgery      multiple peripheral vascular surgeries  . Left aka  2006  . Right renal artery stent  2004  . Rfpbpg  2002  . Lower extremity angiogram  February 21, 2012  . Pr vein bypass graft,aorto-fem-pop  Oct. 17, 2012    Right  Fem-Distal post. Tibial artery BPG.  . Lower extremity angiogram N/A 02/21/2012    Procedure: LOWER EXTREMITY ANGIOGRAM;  Surgeon: Nada Libman, MD;  Location: [facility] C[facility] LAB;  Service: Cardiovascular;  Laterality: N/A;  . Lower extremity angiogram Right 12/25/2012    Procedure: LOWER EXTREMITY ANGIOGRAM;   Surgeon: Nada Libman, MD;  Location: [facility] CATH LAB;  Service: Cardiovascular;  Laterality: Right;  rt leg angio  . Abdominal angiogram  12/25/2012    Procedure: ABDOMINAL ANGIOGRAM;  Surgeon: Nada Libman, MD;  Location: [facility] CATH LAB;  Service: Cardiovascular;;    Family History  Problem Relation Age of Onset  . Coronary artery disease      prevalent in sibling  . Heart disease Mother   . Hypertension Daughter   . Cancer Son     Lung  and  Throat  . Heart attack Son   . Hyperlipidemia Daughter   . Hypertension Daughter   . Hypertension Daughter   . Hypertension Daughter     Social History   Social History  . Marital Status: Widowed    Spouse Name: N/A  . Number of Children: N/A  . Years of Education: N/A   Occupational History  . Not on file.   Social History Main Topics  . Smoking status: Never Smoker   . Smokeless tobacco: Never Used  . Alcohol Use: No  . Drug Use: No  . Sexual Activity: No   Other Topics Concern  . Not on file   Social History Narrative   The PMH, PSH, Social History, Family History, Medications, and allergies have been reviewed in [facility], and have been updated if relevant.   Review of Systems  Constitutional: Negative for fever and fatigue.  HENT: Negative.   Respiratory: Negative.   Cardiovascular: Negative.   Neurological: Negative.   All other systems reviewed and are negative.      Objective:    BP 168/40 mmHg  Pulse 63  Temp(Src) 98 F (36.7 C)  Ht  (1.651 m)  Wt 105 lb 1.9 oz (47.682 kg)  BMI 17.49 kg/m2  SpO2 98%  Wt Readings from Last 3 Encounters:  03/14/16 105 lb 1.9 oz (47.682 kg)  02/09/16 89 lb 14.4 oz (40.778 kg)  01/28/16 102 lb (46.267 kg)    Physical Exam   Constitutional: She is oriented to person, place, and time. No distress.  HENT:  Head: Normocephalic.  Eyes: Conjunctivae are normal.  Neck: Normal range of motion.  Cardiovascular: Normal rate.  Pulmonary/Chest: Effort normal.    Musculoskeletal:  Normal tone and bulk of RLE, no LEE. Left leg AKA  Neurological: She is alert and oriented to person, place, and time.  Skin: Skin is warm and dry. She is not diaphoretic.  Psychiatric: She has a normal mood  and affect. Her behavior is normal. Thought content normal.  Nursing note and vitals reviewed.     Assessment & Plan:   No diagnosis found. No Follow-up on file.

## 2016-03-14 NOTE — Assessment & Plan Note (Signed)
Improved.  Weight has increased and she continues to supplement her diet with shakes.

## 2016-03-14 NOTE — Assessment & Plan Note (Signed)
Due for CBC today but she prefers to hold off on this as "it won't change what we do." She is asymptomatic and compliant with rxs.

## 2016-03-16 ENCOUNTER — Other Ambulatory Visit: Payer: Self-pay

## 2016-03-16 ENCOUNTER — Other Ambulatory Visit: Payer: Self-pay | Admitting: *Deleted

## 2016-03-16 MED ORDER — CLONIDINE HCL 0.3 MG/24HR TD PTWK: 0.3000 mg | MEDICATED_PATCH | TRANSDERMAL | Status: DC

## 2016-03-16 MED ORDER — AMLODIPINE BESYLATE 10 MG PO TABS: 10.0000 mg | ORAL_TABLET | Freq: Every day | ORAL | Status: AC

## 2016-03-16 MED ORDER — ATORVASTATIN CALCIUM 10 MG PO TABS: 10.0000 mg | ORAL_TABLET | Freq: Every day | ORAL | Status: AC

## 2016-03-16 NOTE — Telephone Encounter (Signed)
Lm on pts daughters vm and advised to contact cardiology

## 2016-03-16 NOTE — Telephone Encounter (Signed)
Adrienne Bowman pts granddaughter request refills for amlodipine,atorvastatin,clonopin to [facility] [address]. Advised Adrienne Bowman to contact pharmacy for possible refills or to see if goes to pts cardiologist. [name] said that [facility] will not send to nursing home physician. I advised Adrienne Bowman to discuss this I would need to speak with someone on DPR. Adrienne Bowman hung up. [facility] and [facility] said Dr Karlene LinemanNgetick changed caduet given by Dr Antoine PocheHochrein to amlodipine and atorvastatin. Cost to pt is much more affordable. Pt was seen 03/14/16. Is it OK to refill amlodipine, atorvastatin and clonidine patch?Please advise.

## 2016-03-16 NOTE — Telephone Encounter (Signed)
Given her h/o CAD and the fact that cardiology is managing these rxs, cardiology needs to refill these medications please.

## 2016-03-20 ENCOUNTER — Encounter (HOSPITAL_COMMUNITY): Payer: Self-pay

## 2016-03-20 ENCOUNTER — Inpatient Hospital Stay (HOSPITAL_COMMUNITY)
Admission: EM | Admit: 2016-03-20 | Discharge: 2016-03-24 | DRG: 291 | Disposition: A | Discharge status: Home / Self Care | Payer: Medicare Other | Attending: Internal Medicine | Admitting: Internal Medicine

## 2016-03-20 ENCOUNTER — Emergency Department (HOSPITAL_COMMUNITY): Payer: Medicare Other

## 2016-03-20 DIAGNOSIS — I10 Essential (primary) hypertension: Secondary | ICD-10-CM | POA: Diagnosis not present

## 2016-03-20 DIAGNOSIS — I252 Old myocardial infarction: Secondary | ICD-10-CM | POA: Diagnosis not present

## 2016-03-20 DIAGNOSIS — E785 Hyperlipidemia, unspecified: Secondary | ICD-10-CM | POA: Diagnosis present

## 2016-03-20 DIAGNOSIS — D638 Anemia in other chronic diseases classified elsewhere: Secondary | ICD-10-CM | POA: Diagnosis not present

## 2016-03-20 DIAGNOSIS — D631 Anemia in chronic kidney disease: Secondary | ICD-10-CM | POA: Diagnosis present

## 2016-03-20 DIAGNOSIS — Z515 Encounter for palliative care: Secondary | ICD-10-CM | POA: Diagnosis not present

## 2016-03-20 DIAGNOSIS — R079 Chest pain, unspecified: Secondary | ICD-10-CM

## 2016-03-20 DIAGNOSIS — E1151 Type 2 diabetes mellitus with diabetic peripheral angiopathy without gangrene: Secondary | ICD-10-CM | POA: Diagnosis present

## 2016-03-20 DIAGNOSIS — R54 Age-related physical debility: Secondary | ICD-10-CM | POA: Diagnosis present

## 2016-03-20 DIAGNOSIS — I5033 Acute on chronic diastolic (congestive) heart failure: Secondary | ICD-10-CM | POA: Diagnosis present

## 2016-03-20 DIAGNOSIS — E43 Unspecified severe protein-calorie malnutrition: Secondary | ICD-10-CM | POA: Diagnosis present

## 2016-03-20 DIAGNOSIS — Z951 Presence of aortocoronary bypass graft: Secondary | ICD-10-CM | POA: Diagnosis not present

## 2016-03-20 DIAGNOSIS — E1122 Type 2 diabetes mellitus with diabetic chronic kidney disease: Secondary | ICD-10-CM | POA: Diagnosis present

## 2016-03-20 DIAGNOSIS — I509 Heart failure, unspecified: Secondary | ICD-10-CM

## 2016-03-20 DIAGNOSIS — Z66 Do not resuscitate: Secondary | ICD-10-CM | POA: Diagnosis present

## 2016-03-20 DIAGNOSIS — R0602 Shortness of breath: Secondary | ICD-10-CM | POA: Diagnosis present

## 2016-03-20 DIAGNOSIS — Z8249 Family history of ischemic heart disease and other diseases of the circulatory system: Secondary | ICD-10-CM | POA: Diagnosis not present

## 2016-03-20 DIAGNOSIS — I2510 Atherosclerotic heart disease of native coronary artery without angina pectoris: Secondary | ICD-10-CM | POA: Diagnosis present

## 2016-03-20 DIAGNOSIS — Z681 Body mass index (BMI) 19 or less, adult: Secondary | ICD-10-CM | POA: Diagnosis not present

## 2016-03-20 DIAGNOSIS — N185 Chronic kidney disease, stage 5: Secondary | ICD-10-CM | POA: Diagnosis present

## 2016-03-20 DIAGNOSIS — Z7982 Long term (current) use of aspirin: Secondary | ICD-10-CM

## 2016-03-20 DIAGNOSIS — E1121 Type 2 diabetes mellitus with diabetic nephropathy: Secondary | ICD-10-CM | POA: Diagnosis present

## 2016-03-20 DIAGNOSIS — Z89612 Acquired absence of left leg above knee: Secondary | ICD-10-CM | POA: Diagnosis not present

## 2016-03-20 DIAGNOSIS — R64 Cachexia: Secondary | ICD-10-CM | POA: Diagnosis present

## 2016-03-20 DIAGNOSIS — Z89619 Acquired absence of unspecified leg above knee: Secondary | ICD-10-CM

## 2016-03-20 DIAGNOSIS — E1142 Type 2 diabetes mellitus with diabetic polyneuropathy: Secondary | ICD-10-CM | POA: Diagnosis present

## 2016-03-20 DIAGNOSIS — Z79899 Other long term (current) drug therapy: Secondary | ICD-10-CM | POA: Diagnosis not present

## 2016-03-20 DIAGNOSIS — J9600 Acute respiratory failure, unspecified whether with hypoxia or hypercapnia: Secondary | ICD-10-CM

## 2016-03-20 DIAGNOSIS — N184 Chronic kidney disease, stage 4 (severe): Secondary | ICD-10-CM

## 2016-03-20 DIAGNOSIS — I132 Hypertensive heart and chronic kidney disease with heart failure and with stage 5 chronic kidney disease, or end stage renal disease: Secondary | ICD-10-CM | POA: Diagnosis present

## 2016-03-20 LAB — URINE MICROSCOPIC-ADD ON

## 2016-03-20 LAB — PROTIME-INR
INR: 1.21 (ref 0.00–1.49)
Prothrombin Time: 15.5 seconds — ABNORMAL HIGH (ref 11.6–15.2)

## 2016-03-20 LAB — COMPREHENSIVE METABOLIC PANEL
ALT: 41 U/L (ref 14–54)
AST: 52 U/L — AB (ref 15–41)
Albumin: 3.4 g/dL — ABNORMAL LOW (ref 3.5–5.0)
Alkaline Phosphatase: 60 U/L (ref 38–126)
Anion gap: 14 (ref 5–15)
BILIRUBIN TOTAL: 0.7 mg/dL (ref 0.3–1.2)
BUN: 78 mg/dL — AB (ref 6–20)
CO2: 22 mmol/L (ref 22–32)
CREATININE: 3.73 mg/dL — AB (ref 0.44–1.00)
Calcium: 9 mg/dL (ref 8.9–10.3)
Chloride: 104 mmol/L (ref 101–111)
GFR calc Af Amer: 11 mL/min — ABNORMAL LOW (ref >60)
GFR, EST NON AFRICAN AMERICAN: 9 mL/min — AB (ref >60)
Glucose, Bld: 241 mg/dL — ABNORMAL HIGH (ref 65–99)
Potassium: 3.8 mmol/L (ref 3.5–5.1)
Sodium: 140 mmol/L (ref 135–145)
TOTAL PROTEIN: 6.6 g/dL (ref 6.5–8.1)

## 2016-03-20 LAB — I-STAT ARTERIAL BLOOD GAS, ED
ACID-BASE DEFICIT: 3 mmol/L — AB (ref 0.0–2.0)
BICARBONATE: 23.3 meq/L (ref 20.0–24.0)
O2 Saturation: 96 %
PCO2 ART: 45.7 mmHg — AB (ref 35.0–45.0)
PH ART: 7.314 — AB (ref 7.350–7.450)
PO2 ART: 86 mmHg (ref 80.0–100.0)
Patient temperature: 97.8
TCO2: 25 mmol/L (ref 0–100)

## 2016-03-20 LAB — GLUCOSE, CAPILLARY
Glucose-Capillary: 125 mg/dL — ABNORMAL HIGH (ref 65–99)
Glucose-Capillary: 95 mg/dL (ref 65–99)

## 2016-03-20 LAB — D-DIMER, QUANTITATIVE: D-Dimer, Quant: 2.28 ug/mL-FEU — ABNORMAL HIGH (ref 0.00–0.50)

## 2016-03-20 LAB — CBC
HEMATOCRIT: 29.5 % — AB (ref 36.0–46.0)
HEMOGLOBIN: 9 g/dL — AB (ref 12.0–15.0)
MCH: 27.4 pg (ref 26.0–34.0)
MCHC: 30.5 g/dL (ref 30.0–36.0)
MCV: 89.7 fL (ref 78.0–100.0)
Platelets: 170 10*3/uL (ref 150–400)
RBC: 3.29 MIL/uL — ABNORMAL LOW (ref 3.87–5.11)
RDW: 16.4 % — ABNORMAL HIGH (ref 11.5–15.5)
WBC: 6.4 10*3/uL (ref 4.0–10.5)

## 2016-03-20 LAB — I-STAT TROPONIN, ED: Troponin i, poc: 0.05 ng/mL (ref 0.00–0.08)

## 2016-03-20 LAB — URINALYSIS, ROUTINE W REFLEX MICROSCOPIC
Bilirubin Urine: NEGATIVE
Glucose, UA: NEGATIVE mg/dL
Hgb urine dipstick: NEGATIVE
Ketones, ur: NEGATIVE mg/dL
LEUKOCYTES UA: NEGATIVE
NITRITE: NEGATIVE
PH: 5.5 (ref 5.0–8.0)
Protein, ur: 100 mg/dL — AB
SPECIFIC GRAVITY, URINE: 1.013 (ref 1.005–1.030)

## 2016-03-20 LAB — MRSA PCR SCREENING: MRSA by PCR: NEGATIVE

## 2016-03-20 LAB — APTT: APTT: 33 s (ref 24–37)

## 2016-03-20 LAB — PROCALCITONIN: Procalcitonin: 0.54 ng/mL

## 2016-03-20 LAB — BRAIN NATRIURETIC PEPTIDE: B Natriuretic Peptide: 3876 pg/mL — ABNORMAL HIGH (ref 0.0–100.0)

## 2016-03-20 LAB — LACTIC ACID, PLASMA: Lactic Acid, Venous: 0.7 mmol/L (ref 0.5–2.0)

## 2016-03-20 MED ORDER — ASPIRIN EC 81 MG PO TBEC
81.0000 mg | DELAYED_RELEASE_TABLET | Freq: Every day | ORAL | Status: DC
  Administered 2016-03-21 – 2016-03-24 (×4): 81 mg via ORAL
  Filled 2016-03-20 (×4): qty 1

## 2016-03-20 MED ORDER — ONDANSETRON HCL 4 MG/2ML IJ SOLN: 4.0000 mg | Freq: Three times a day (TID) | INTRAMUSCULAR | Status: DC | PRN

## 2016-03-20 MED ORDER — SODIUM CHLORIDE 0.9 % IV SOLN: 250.0000 mL | INTRAVENOUS | Status: DC | PRN

## 2016-03-20 MED ORDER — SODIUM CHLORIDE 0.9% FLUSH: 3.0000 mL | INTRAVENOUS | Status: DC | PRN

## 2016-03-20 MED ORDER — CARVEDILOL 25 MG PO TABS
25.0000 mg | ORAL_TABLET | Freq: Two times a day (BID) | ORAL | Status: DC
  Administered 2016-03-21 – 2016-03-24 (×7): 25 mg via ORAL
  Filled 2016-03-20 (×7): qty 1

## 2016-03-20 MED ORDER — CARVEDILOL 25 MG PO TABS: 25.0000 mg | ORAL_TABLET | Freq: Two times a day (BID) | ORAL | Status: DC

## 2016-03-20 MED ORDER — ASPIRIN 81 MG PO CHEW
324.0000 mg | CHEWABLE_TABLET | Freq: Once | ORAL | Status: AC
  Administered 2016-03-20: 324 mg via ORAL
  Filled 2016-03-20: qty 4

## 2016-03-20 MED ORDER — NITROGLYCERIN 0.4 MG SL SUBL
0.4000 mg | SUBLINGUAL_TABLET | SUBLINGUAL | Status: DC | PRN
  Filled 2016-03-20: qty 1

## 2016-03-20 MED ORDER — VANCOMYCIN HCL 500 MG IV SOLR
500.0000 mg | Freq: Once | INTRAVENOUS | Status: AC
  Administered 2016-03-20: 500 mg via INTRAVENOUS
  Filled 2016-03-20: qty 500

## 2016-03-20 MED ORDER — FUROSEMIDE 10 MG/ML IJ SOLN
60.0000 mg | Freq: Three times a day (TID) | INTRAMUSCULAR | Status: AC
  Administered 2016-03-20 (×2): 60 mg via INTRAVENOUS
  Filled 2016-03-20 (×2): qty 6

## 2016-03-20 MED ORDER — FAMOTIDINE 20 MG PO TABS
20.0000 mg | ORAL_TABLET | Freq: Every day | ORAL | Status: DC
  Administered 2016-03-21 – 2016-03-24 (×4): 20 mg via ORAL
  Filled 2016-03-20 (×4): qty 1

## 2016-03-20 MED ORDER — FUROSEMIDE 10 MG/ML IJ SOLN
60.0000 mg | Freq: Once | INTRAMUSCULAR | Status: AC
  Administered 2016-03-20: 60 mg via INTRAVENOUS
  Filled 2016-03-20: qty 6

## 2016-03-20 MED ORDER — ATORVASTATIN CALCIUM 10 MG PO TABS
10.0000 mg | ORAL_TABLET | Freq: Every day | ORAL | Status: DC
  Administered 2016-03-21 – 2016-03-24 (×4): 10 mg via ORAL
  Filled 2016-03-20 (×4): qty 1

## 2016-03-20 MED ORDER — ONDANSETRON HCL 4 MG/2ML IJ SOLN: 4.0000 mg | Freq: Three times a day (TID) | INTRAMUSCULAR | Status: AC | PRN

## 2016-03-20 MED ORDER — FUROSEMIDE 10 MG/ML IJ SOLN: 60.0000 mg | Freq: Two times a day (BID) | INTRAMUSCULAR | Status: DC

## 2016-03-20 MED ORDER — HYDRALAZINE HCL 20 MG/ML IJ SOLN
5.0000 mg | Freq: Four times a day (QID) | INTRAMUSCULAR | Status: DC | PRN
  Administered 2016-03-20: 5 mg via INTRAVENOUS
  Filled 2016-03-20 (×2): qty 1

## 2016-03-20 MED ORDER — AMLODIPINE BESYLATE 10 MG PO TABS
10.0000 mg | ORAL_TABLET | Freq: Every day | ORAL | Status: DC
  Administered 2016-03-21 – 2016-03-24 (×4): 10 mg via ORAL
  Filled 2016-03-20 (×4): qty 1

## 2016-03-20 MED ORDER — HEPARIN SODIUM (PORCINE) 5000 UNIT/ML IJ SOLN: 5000.0000 [IU] | Freq: Three times a day (TID) | INTRAMUSCULAR | Status: DC

## 2016-03-20 MED ORDER — IPRATROPIUM-ALBUTEROL 0.5-2.5 (3) MG/3ML IN SOLN
3.0000 mL | Freq: Once | RESPIRATORY_TRACT | Status: DC
  Filled 2016-03-20: qty 3

## 2016-03-20 MED ORDER — LORAZEPAM 2 MG/ML IJ SOLN
0.5000 mg | Freq: Once | INTRAMUSCULAR | Status: AC
  Administered 2016-03-20: 0.5 mg via INTRAVENOUS
  Filled 2016-03-20: qty 1

## 2016-03-20 MED ORDER — PIPERACILLIN-TAZOBACTAM IN DEX 2-0.25 GM/50ML IV SOLN
2.2500 g | Freq: Three times a day (TID) | INTRAVENOUS | Status: DC
  Administered 2016-03-20 – 2016-03-21 (×3): 2.25 g via INTRAVENOUS
  Filled 2016-03-20 (×5): qty 50

## 2016-03-20 MED ORDER — LABETALOL HCL 5 MG/ML IV SOLN
10.0000 mg | Freq: Once | INTRAVENOUS | Status: AC
  Administered 2016-03-20: 10 mg via INTRAVENOUS
  Filled 2016-03-20: qty 4

## 2016-03-20 MED ORDER — HYDRALAZINE HCL 50 MG PO TABS
100.0000 mg | ORAL_TABLET | Freq: Three times a day (TID) | ORAL | Status: DC
  Administered 2016-03-21 – 2016-03-24 (×10): 100 mg via ORAL
  Filled 2016-03-20 (×12): qty 2

## 2016-03-20 MED ORDER — METOPROLOL TARTRATE 5 MG/5ML IV SOLN
5.0000 mg | Freq: Four times a day (QID) | INTRAVENOUS | Status: DC
  Administered 2016-03-20 – 2016-03-21 (×6): 5 mg via INTRAVENOUS
  Filled 2016-03-20 (×7): qty 5

## 2016-03-20 MED ORDER — HEPARIN SODIUM (PORCINE) 5000 UNIT/ML IJ SOLN
5000.0000 [IU] | Freq: Two times a day (BID) | INTRAMUSCULAR | Status: DC
  Administered 2016-03-20 – 2016-03-23 (×7): 5000 [IU] via SUBCUTANEOUS
  Filled 2016-03-20 (×7): qty 1

## 2016-03-20 MED ORDER — SODIUM CHLORIDE 0.9% FLUSH
3.0000 mL | Freq: Two times a day (BID) | INTRAVENOUS | Status: DC
  Administered 2016-03-21 – 2016-03-23 (×4): 3 mL via INTRAVENOUS

## 2016-03-20 MED ORDER — INSULIN ASPART 100 UNIT/ML ~~LOC~~ SOLN
0.0000 [IU] | Freq: Three times a day (TID) | SUBCUTANEOUS | Status: DC
  Administered 2016-03-21 – 2016-03-23 (×3): 2 [IU] via SUBCUTANEOUS

## 2016-03-20 MED ORDER — CLONIDINE HCL 0.3 MG/24HR TD PTWK
0.3000 mg | MEDICATED_PATCH | TRANSDERMAL | Status: DC
  Administered 2016-03-20: 0.3 mg via TRANSDERMAL
  Filled 2016-03-20: qty 1

## 2016-03-20 MED ORDER — HYDRALAZINE HCL 20 MG/ML IJ SOLN: 5.0000 mg | Freq: Four times a day (QID) | INTRAMUSCULAR | Status: DC

## 2016-03-20 MED ORDER — IPRATROPIUM-ALBUTEROL 0.5-2.5 (3) MG/3ML IN SOLN: 3.0000 mL | RESPIRATORY_TRACT | Status: DC | PRN

## 2016-03-20 MED ORDER — HYDRALAZINE HCL 20 MG/ML IJ SOLN
5.0000 mg | Freq: Three times a day (TID) | INTRAMUSCULAR | Status: DC | PRN
  Administered 2016-03-20: 10 mg via INTRAVENOUS
  Filled 2016-03-20: qty 1

## 2016-03-20 NOTE — Plan of Care (Signed)
Problem: Pain Managment: Goal: General experience of comfort will improve Outcome: Completed/Met Date Met:  03/20/16 Pt educated on pain scale and interventions. Pt verbalized understanding.

## 2016-03-20 NOTE — ED Notes (Signed)
Pt comes from home c/o CP with SOB, nausea denies vomiting. No radiating.

## 2016-03-20 NOTE — ED Provider Notes (Signed)
CSN: [id number]     Arrival date & time 03/20/16  0600 History   First MD Initiated Contact with Patient 03/20/16 [id number]     Chief Complaint  Patient presents with  . Chest Pain  . Shortness of Breath     (Consider location/radiation/quality/duration/timing/severity/associated sxs/prior Treatment) HPI   Adrienne Bowman is a 82 y.o. female, with a history of CHF, hypertension, DM, CAD, anemia, left AKA, and CKD, presenting to the ED with cp that came on sometime last night. Chest pain came on first, followed by shortness of breath. Pain woke her up. Denies ever feeling pain like this before. Patient rates her pain at 10 out of 10, describes it as a pressure, nonradiating. Patient's daughter and son-in-law are at the bedside. They state the patient has no POA. Patient states that she has had to be placed on BiPAP before, but has never had to be intubated. When asked about CODE STATUS, patient states, "I don't really know what I want, I guess everything." Patient endorses some nausea, but no vomiting. Denies fever/chills, cough, recent illness, or any other complaints. Further denies history of blood clots, cancer, recent trauma, surgery, or immobilization.     Past Medical History  Diagnosis Date  . CHF (congestive heart failure) (HCC) 2002    acute CHF while getting lower extremilty arteriograms /  Led to CABG  x6  . Hypertension     very difficult to control  . PVD (peripheral vascular disease) (HCC)     extensive PVD / left AKA  . Murmur, heart     soft systolic outflow murmur and a grade 2/6 murmur of the aortic insufficiency  . S/P AKA (above knee amputation) (HCC)     left AKA with prosthesis  . DIABETES MELLITUS, TYPE II   . NEPHROPATHY, DIABETIC   . Coronary artery disease 2002    CABG x6 / left internal mammary artery graft to the LAD, a saphenous vein graft to the diagonal, a sequential saphenous vein graft to the 1st and 2nd obtuse marginal branches, and a sequential saphenous  vein graft the the distal right coronary artery and posterior descending  . Anemia   . CKD (chronic kidney disease)   . Advanced age    Past Surgical History  Procedure Laterality Date  . Coronary artery bypass graft  2002    x6 per Dr. Laneta Simmers  . Eye surgery  1987 & 1988     left eye  . Vascular surgery      multiple peripheral vascular surgeries  . Left aka  2006  . Right renal artery stent  2004  . Rfpbpg  2002  . Lower extremity angiogram  February 21, 2012  . Pr vein bypass graft,aorto-fem-pop  Oct. 17, 2012    Right  Fem-Distal post. Tibial artery BPG.  . Lower extremity angiogram N/A 02/21/2012    Procedure: LOWER EXTREMITY ANGIOGRAM;  Surgeon: Nada Libman, MD;  Location[facility] CATH LAB;  Service: Cardiovascular;  Laterality: N/A;  . Lower extremity angiogram Right 12/25/2012    Procedure: LOWER EXTREMITY ANGIOGRAM;  Surgeon: Nada Libman, MD;  Location: [facility] CATH LAB;  Service: Cardiovascular;  Laterality: Right;  rt leg angio  . Abdominal angiogram  12/25/2012    Procedure: ABDOMINAL ANGIOGRAM;  Surgeon: Nada Libman, MD;  Location: [facility] CATH LAB;  Service: Cardiovascular;;   Family History  Problem Relation Age of Onset  . Coronary artery disease      prevalent in sibling  .  Heart disease Mother   . Hypertension Daughter   . Cancer Son     Lung  and  Throat  . Heart attack Son   . Hyperlipidemia Daughter   . Hypertension Daughter   . Hypertension Daughter   . Hypertension Daughter    Social History  Substance Use Topics  . Smoking status: Never Smoker   . Smokeless tobacco: Never Used  . Alcohol Use: No   OB History    No data available     Review of Systems  Constitutional: Negative for fever and chills.  Respiratory: Positive for shortness of breath. Negative for cough.   Cardiovascular: Positive for chest pain. Negative for leg swelling.  Gastrointestinal: Positive for nausea. Negative for vomiting, abdominal pain and diarrhea.  Genitourinary: Negative  for dysuria and hematuria.  Skin: Negative for color change and pallor.  Neurological: Negative for dizziness, syncope, weakness, light-headedness and numbness.  All other systems reviewed and are negative.     Allergies  Review of patient's allergies indicates no known allergies.  Home Medications   Prior to Admission medications   Medication Sig Start Date End Date Taking? Authorizing Provider  amLODipine (NORVASC) 10 MG tablet Take 1 tablet (10 mg total) by mouth daily. 03/16/16  Yes Rollene Rotunda, MD  aspirin EC 81 MG tablet Take 81 mg by mouth daily.   Yes Historical Provider, MD  atorvastatin (LIPITOR) 10 MG tablet Take 1 tablet (10 mg total) by mouth daily. 03/16/16  Yes Rollene Rotunda, MD  carvedilol (COREG) 25 MG tablet Take 1 tablet (25 mg total) by mouth 2 (two) times daily with a meal. Patient taking differently: Take 25 mg by mouth 2 (two) times daily with a meal. For CHF 08/20/15  Yes Dwana Melena, PA-C  cloNIDine (CATAPRES - DOSED IN MG/24 HR) 0.3 mg/24hr patch Place 1 patch (0.3 mg total) onto the skin once a week. 03/16/16  Yes Rollene Rotunda, MD  docusate sodium (COLACE) 100 MG capsule Take 1 capsule (100 mg total) by mouth daily. 01/28/16  Yes Dinah C Ngetich, NP  famotidine (PEPCID) 20 MG tablet TAKE ONE TABLET BY MOUTH ONCE DAILY 12/07/15  Yes Dianne Dun, MD  ferrous sulfate 325 (65 FE) MG tablet Take 1 tablet (325 mg total) by mouth daily with breakfast. 01/28/16  Yes Dinah C Ngetich, NP  furosemide (LASIX) 40 MG tablet TAKE ONE TABLET BY MOUTH ONCE DAILY 12/07/15  Yes Dianne Dun, MD  hydrALAZINE (APRESOLINE) 100 MG tablet TAKE ONE TABLET BY MOUTH THREE TIMES DAILY 12/07/15  Yes Rollene Rotunda, MD  Multiple Vitamin (MULTIVITAMIN WITH MINERALS) TABS tablet Take 1 tablet by mouth daily.   Yes Historical Provider, MD  nitroGLYCERIN (NITROSTAT) 0.4 MG SL tablet Place 1 tablet (0.4 mg total) under the tongue every 5 (five) minutes as needed for chest pain. 07/13/15  Yes Renae Fickle, MD  UNABLE TO FIND Med Name: Med pass 120 mL by mouth twice daily between meals as supplement   Yes Historical Provider, MD   BP 180/66 mmHg  Pulse 79  Temp(Src) 97.8 F (36.6 C) (Axillary)  Resp 19  SpO2 100% Physical Exam  Constitutional: She is oriented to person, place, and time. She appears well-developed and well-nourished. No distress.  HENT:  Head: Normocephalic and atraumatic.  Eyes: Conjunctivae are normal. Pupils are equal, round, and reactive to light.  Neck: Neck supple.  Cardiovascular: Normal rate, regular rhythm, normal heart sounds and intact distal pulses.   Pulmonary/Chest: Accessory muscle  usage present. Tachypnea noted. She is in respiratory distress. She has decreased breath sounds in the right upper field, the right middle field, the right lower field, the left upper field, the left middle field and the left lower field.  Significant increased work of breathing. Patient speaks in 1-2 word phrases.  Abdominal: Soft. There is no tenderness. There is no guarding.  Musculoskeletal: She exhibits no edema or tenderness.  Lymphadenopathy:    She has no cervical adenopathy.  Neurological: She is alert and oriented to person, place, and time.  Skin: Skin is warm and dry. She is not diaphoretic.  Psychiatric: She has a normal mood and affect. Her behavior is normal.  Nursing note and vitals reviewed.   ED Course  Procedures (including critical care time)  CRITICAL CARE Performed by: Allyne Hebert C Saria Haran Total critical care time: 35 minutes Critical care time was exclusive of separately billable procedures and treating other patients. Critical care was necessary to treat or prevent imminent or life-threatening deterioration. Critical care was time spent personally by me on the following activities: development of treatment plan with patient and/or surrogate as well as nursing, discussions with consultants, evaluation of patient's response to treatment, examination of  patient, obtaining history from patient or surrogate, ordering and performing treatments and interventions, ordering and review of laboratory studies, ordering and review of radiographic studies, pulse oximetry and re-evaluation of patient's condition.  Labs Review Labs Reviewed  CBC - Abnormal; Notable for the following:    RBC 3.29 (*)    Hemoglobin 9.0 (*)    HCT 29.5 (*)    RDW 16.4 (*)    All other components within normal limits  COMPREHENSIVE METABOLIC PANEL - Abnormal; Notable for the following:    Glucose, Bld 241 (*)    BUN 78 (*)    Creatinine, Ser 3.73 (*)    Albumin 3.4 (*)    AST 52 (*)    GFR calc non Af Amer 9 (*)    GFR calc Af Amer 11 (*)    All other components within normal limits  PROTIME-INR - Abnormal; Notable for the following:    Prothrombin Time 15.5 (*)    All other components within normal limits  BRAIN NATRIURETIC PEPTIDE - Abnormal; Notable for the following:    B Natriuretic Peptide 3876.0 (*)    All other components within normal limits  D-DIMER, QUANTITATIVE (NOT AT [facility]) - Abnormal; Notable for the following:    D-Dimer, Quant 2.28 (*)    All other components within normal limits  I-STAT ARTERIAL BLOOD GAS, ED - Abnormal; Notable for the following:    pH, Arterial 7.314 (*)    pCO2 arterial 45.7 (*)    Acid-base deficit 3.0 (*)    All other components within normal limits  URINE CULTURE  APTT  BLOOD GAS, ARTERIAL  URINALYSIS, ROUTINE W REFLEX MICROSCOPIC (NOT AT [facility])  I-STAT TROPOININ, ED    Imaging Review Dg Chest Portable 1 View  03/20/2016  CLINICAL DATA:  Generalized chest pain, nausea, and shortness of breath for unknown period of time. History of CHF, hypertension, coronary disease, and diabetes. Nonsmoker. EXAM: PORTABLE CHEST 1 VIEW COMPARISON:  01/18/2016 FINDINGS: Postoperative changes in the mediastinum. Cardiac enlargement with pulmonary vascular congestion and diffuse interstitial edema. Pattern is similar to previous study. No  blunting of costophrenic angles. No pneumothorax. Mediastinal contours appear intact. Calcified and tortuous aorta. IMPRESSION: Cardiac enlargement with pulmonary vascular congestion and diffuse interstitial edema similar to previous study. Electronically Signed  By: Burman Nieves M.D.   On: 03/20/2016 06:49   I have personally reviewed and evaluated these images and lab results as part of my medical decision-making.   EKG Interpretation   Date/Time:  Sunday Mar 20 2016 06:05:54 EDT Ventricular Rate:  93 PR Interval:  217 QRS Duration: 117 QT Interval:  368 QTC Calculation: 458 R Axis:   -46 Text Interpretation:  Sinus rhythm Ventricular premature complex Prolonged  PR interval Probable left atrial enlargement LAD, consider left anterior  fascicular block LVH with secondary repolarization abnormality Probable  anterior infarct, age indeterminate No significant change was found as  compared to ECG January 17, 2016 Confirmed by Patria Mane  MD, Caryn Bee ([id number]) on  03/20/2016 6:11:10 AM Also confirmed by Patria Mane  MD, Caryn Bee ([id number]), editor  Stout CT, Jola Babinski [phone])  on 03/20/2016 7:48:36 AM       Medications  nitroGLYCERIN (NITROSTAT) SL tablet 0.4 mg (not administered)  aspirin chewable tablet 324 mg (324 mg Oral Given 03/20/16 0630)  furosemide (LASIX) injection 60 mg (60 mg Intravenous Given 03/20/16 0735)    Orders Placed This Encounter  Procedures  . Urine culture  . DG Chest Portable 1 View  . CBC  . Comprehensive metabolic panel  . APTT  . Protime-INR  . Brain natriuretic peptide  . D-dimer, quantitative (not at [facility])  . Blood gas, arterial  . Urinalysis, Routine w reflex microscopic  . Cardiac monitoring  . Consult for [facility] Admission  . Consult to hospitalist  . Pulse oximetry, continuous  . Bipap  . I-stat troponin, ED  . I-Stat arterial blood gas, ED  . EKG 12-Lead  . ED EKG  . Insert peripheral IV  . Admit to Inpatient (patient's expected length of stay will  be greater than 2 midnights or inpatient only procedure)  . Admit to Inpatient (patient's expected length of stay will be greater than 2 midnights or inpatient only procedure)    MDM   Final diagnoses:  Chest pain, unspecified chest pain type  Shortness of breath  Acute respiratory failure, unspecified whether with hypoxia or hypercapnia (HCC)    [name] presents with sudden onset chest pain and shortness of breath that began sometime last night.  Findings and plan of care discussed with Azalia Bilis, MD. Dr. Patria Mane personally evaluated and examined this patient.  Suspect acute CHF exacerbation. HEART score is 6, indicating moderate risk for a cardiac event. Wells criteria score is 3, indicating moderate risk for PE. Pertinent labs and interventions ordered. Pt improved on BiPAP. Chest x-ray confirms pulmonary edema. In this light, suspect the patient's elevated d-dimer may be incidental, as patient has no risk factors for PE, but has a history of CHF exacerbations with similar presentation. Patient is noted to have a palliative care consult note done 01/20/2016 which specifies that the patient wishes to be DNR/DNI. Patient's statements today could be due to her distress and she may have misunderstood the question. Patient will likely need a repeat palliative care consult. 8:08 AM Spoke with Willette Cluster, NP for [facility], who agreed to admit the patient under Dr. Julien Nordmann. Stepdown bed requested due to the patient being on BiPAP.    Filed Vitals:   03/20/16 0609 03/20/16 0615 03/20/16 0630 03/20/16 0645  BP: 185/65 187/84 194/64 196/59  Pulse: 91 90 92 84  Temp: 97.8 F (36.6 C)     TempSrc: Axillary     Resp: 32 32 33 33  SpO2: 96% 99% 91% 100%  Filed Vitals:   03/20/16 0645 03/20/16 0727 03/20/16 0730 03/20/16 0745  BP: 196/59 193/63 174/72 180/66  Pulse: 84 82 80 79  Temp:      TempSrc:      Resp: 33 32 27 19  SpO2: 100% 100% 100% 100%      Anselm Pancoast, PA-C 03/20/16 1610  Anselm Pancoast, PA-C 03/20/16 9604  Azalia Bilis, MD 03/21/16 [phone]

## 2016-03-20 NOTE — Progress Notes (Signed)
While transporting the Bipap patient down the hall in the emergency room, we went over a metal strip bump in the hall & Bipap said inoperable & turned off. Pt taken off Bipap & transported to 3W07 on room air. Sats remained 98% and greater throughout transport with no change in breathing pattern. Other vitals stable as well. Called charged RT and she brought up another BiPAP to change out so that this BiPAP can be checked by [facility]. BiPAP changed, patient remained stable. RN aware.

## 2016-03-20 NOTE — Progress Notes (Signed)
Pharmacy Antibiotic Note  Adrienne Bowman is a 82 y.o. female admitted on 03/20/2016 with suspected infection.  Pharmacy has been consulted for Zosyn dosing.    Pt presented to ED with SOB.  SHe has had some nausea as well.  Pt with hx CKD and Cr is elevated, but actually improved from 4.66 in March 2017.  She is afebrile and WBC is wnl.  Zosyn will be adjusted for renal function.  Vancomycin 500mg  IV x 1 has been ordered as well.  Plan: Zosyn 2.25g IV q8 F/U culture data F/U plans to continue Vancomycin; she will need a random level prior to redosing     Temp (24hrs), Avg:97.8 F (36.6 C), Min:97.8 F (36.6 C), Max:97.8 F (36.6 C)   Recent Labs Lab 03/20/16 0610  WBC 6.4  CREATININE 3.73*    Estimated Creatinine Clearance: 6.8 mL/min (by C-G formula based on Cr of 3.73).    No Known Allergies   Thank you for allowing pharmacy to be a part of this patient's care.  Marisue HumbleKendra Keslie Gritz, PharmD Clinical Pharmacist [facility]

## 2016-03-20 NOTE — ED Notes (Signed)
Pt work of breathing increased at this time. Pt reports anxiety at this time as well. PA Huntley DecSara notified. Also spoke with PA regarding concern for giving patient medications PO due to labored respirations at this time. PA recommends giving 0.5 mg ativan IV at this time for patient anxiety on Bipap.

## 2016-03-20 NOTE — Progress Notes (Signed)
Emergency Room RT called to have patients Bipap changed out due to machine failure in th ED hallway. I changed out & tagged Bipap [phone]#[id number] for [employer] to check. Pt remained stable throughout.

## 2016-03-20 NOTE — H&P (Signed)
History and Physical    Adrienne Bowman EPP:[id number] DOB: September 24, 1918 DOA: 03/20/2016  Referring MD/NP/PA: EDP PCP: Arnette Norris, MD  Outpatient Specialists: Patient Care Team: Lucille Passy, MD as PCP - General Salem Senate, MD (Nephrology)  Patient coming from:  Home    Chief Complaint: Shortness of breath   HPI: Adrienne Bowman is a 82 y.o. female with medical history significant for  CHF HTN, HLD, CKD, DM2, CAD, anemia, L AKA presenting to the ED with increasing SOB preceded by chest pain last evening, waking her up. At the time her chest pain was severe,  10/10 and non radiating. She reports some nausea without vomiting.  Denies fevers, chills, night sweats, vision changes, or mucositis. Denies lower extremity swelling. Denies nausea, heartburn or change in bowel habits. Denies abdominal pain. Appetite is normal. Denies any dysuria. Denies abnormal skin rashes, or neuropathy. Denies any bleeding issues such as epistaxis, hematemesis, hematuria or hematochezia.    ED Course:   BP 180/66 mmHg  Pulse 79  Temp(Src) 97.8 F (36.6 C) (Axillary)  Resp 19  SpO2 100% Hb 9, Glu 241, Cr 3.73, BNP 3876,  CO2 45.7  CXR Cardiac enlargement with pulmonary vascular congestion and diffuse interstitial edema  EKG SR QTC 458, no significant change from prior.   Review of Systems: As per HPI otherwise 10 point review of systems negative.   Past Medical History  Diagnosis Date  . CHF (congestive heart failure) ([place]) 2002    acute CHF while getting lower extremilty arteriograms /  Led to CABG  x6  . Hypertension     very difficult to control  . PVD (peripheral vascular disease) ([place])     extensive PVD / left AKA  . Murmur, heart     soft systolic outflow murmur and a grade 2/6 murmur of the aortic insufficiency  . S/P AKA (above knee amputation) (HCC)     left AKA with prosthesis  . DIABETES MELLITUS, TYPE II   . NEPHROPATHY, DIABETIC   . Coronary artery disease 2002    CABG x6 / left internal  mammary artery graft to the LAD, a saphenous vein graft to the diagonal, a sequential saphenous vein graft to the 1st and 2nd obtuse marginal branches, and a sequential saphenous vein graft the the distal right coronary artery and posterior descending  . Anemia   . CKD (chronic kidney disease)   . Advanced age     Past Surgical History  Procedure Laterality Date  . Coronary artery bypass graft  2002    x6 per Dr. Cyndia Bent  . Eye surgery  [place]     left eye  . Vascular surgery      multiple peripheral vascular surgeries  . Left aka  2006  . Right renal artery stent  2004  . Rfpbpg  2002  . Lower extremity angiogram  February 21, 2012  . Pr vein bypass graft,aorto-fem-pop  Oct. 17, 2012    Right  Fem-Distal post. Tibial artery BPG.  . Lower extremity angiogram N/A 02/21/2012    Procedure: LOWER EXTREMITY ANGIOGRAM;  Surgeon: Serafina Mitchell, MD;  Location: [facility] CATH LAB;  Service: Cardiovascular;  Laterality: N/A;  . Lower extremity angiogram Right 12/25/2012    Procedure: LOWER EXTREMITY ANGIOGRAM;  Surgeon: Serafina Mitchell, MD;  Location: [facility] CATH LAB;  Service: Cardiovascular;  Laterality: Right;  rt leg angio  . Abdominal angiogram  12/25/2012    Procedure: ABDOMINAL ANGIOGRAM;  Surgeon: Butch Penny  Trula Slade, MD;  Location: [place] CATH LAB;  Service: Cardiovascular;;     reports that she has never smoked. She has never used smokeless tobacco. She reports that she does not drink alcohol or use illicit drugs.  No Known Allergies  Family History  Problem Relation Age of Onset  . Coronary artery disease      prevalent in sibling  . Heart disease Mother   . Hypertension Daughter   . Cancer Son     Lung  and  Throat  . Heart attack Son   . Hyperlipidemia Daughter   . Hypertension Daughter   . Hypertension Daughter   . Hypertension Daughter     Family history reviewed and not pertinent (If you reviewed it)  Prior to Admission medications   Medication Sig Start Date End Date Taking?  Authorizing Provider  amLODipine (NORVASC) 10 MG tablet Take 1 tablet (10 mg total) by mouth daily. 03/16/16  Yes Minus Breeding, MD  aspirin EC 81 MG tablet Take 81 mg by mouth daily.   Yes Historical Provider, MD  atorvastatin (LIPITOR) 10 MG tablet Take 1 tablet (10 mg total) by mouth daily. 03/16/16  Yes Minus Breeding, MD  carvedilol (COREG) 25 MG tablet Take 1 tablet (25 mg total) by mouth 2 (two) times daily with a meal. Patient taking differently: Take 25 mg by mouth 2 (two) times daily with a meal. For CHF 08/20/15  Yes Brett Canales, PA-C  cloNIDine (CATAPRES - DOSED IN MG/24 HR) 0.3 mg/24hr patch Place 1 patch (0.3 mg total) onto the skin once a week. 03/16/16  Yes Minus Breeding, MD  docusate sodium (COLACE) 100 MG capsule Take 1 capsule (100 mg total) by mouth daily. 01/28/16  Yes Dinah C Ngetich, NP  famotidine (PEPCID) 20 MG tablet TAKE ONE TABLET BY MOUTH ONCE DAILY 12/07/15  Yes Lucille Passy, MD  ferrous sulfate 325 (65 FE) MG tablet Take 1 tablet (325 mg total) by mouth daily with breakfast. 01/28/16  Yes Dinah C Ngetich, NP  furosemide (LASIX) 40 MG tablet TAKE ONE TABLET BY MOUTH ONCE DAILY 12/07/15  Yes Lucille Passy, MD  hydrALAZINE (APRESOLINE) 100 MG tablet TAKE ONE TABLET BY MOUTH THREE TIMES DAILY 12/07/15  Yes Minus Breeding, MD  Multiple Vitamin (MULTIVITAMIN WITH MINERALS) TABS tablet Take 1 tablet by mouth daily.   Yes Historical Provider, MD  nitroGLYCERIN (NITROSTAT) 0.4 MG SL tablet Place 1 tablet (0.4 mg total) under the tongue every 5 (five) minutes as needed for chest pain. 07/13/15  Yes Janece Canterbury, MD  UNABLE TO FIND Med Name: Med pass 120 mL by mouth twice daily between meals as supplement   Yes Historical Provider, MD    Physical Exam:    Filed Vitals:   03/20/16 0645 03/20/16 0727 03/20/16 0730 03/20/16 0745  BP: 196/59 193/63 174/72 180/66  Pulse: 84 82 80 79  Temp:      TempSrc:      Resp: 33 32 27 19  SpO2: 100% 100% 100% 100%      Constitutional:  Respiratory distress, uncomfortable, cachectic  Filed Vitals:   03/20/16 0645 03/20/16 0727 03/20/16 0730 03/20/16 0745  BP: 196/59 193/63 174/72 180/66  Pulse: 84 82 80 79  Temp:      TempSrc:      Resp: 33 32 27 19  SpO2: 100% 100% 100% 100%   Eyes: PERRL, lids and conjunctivae normal ENMT: cannot assess, patient on BiPAP Neck: normal, supple, no masses, no thyromegaly. +JVD  Respiratory:  Diffuse rhonchi and crackles.No wheezing. Increased respiratory effort. Accessory muscle use.  Cardiovascular: Regular rate and rhythm, no murmurs / rubs / gallops. No extremity edema. L AKA. 2+ pedal pulses. No carotid bruits.  Abdomen: no tenderness, no masses palpated. No hepatosplenomegaly. Bowel sounds positive.  Musculoskeletal: no clubbing / cyanosis. No contractures.Muscle wasting Skin: no rashes, lesions, ulcers. No induration Neurologic: CN 2-12 grossly intact. Sensation intact, DTR normal. Strength 5/5 in all 4.  Psychiatric: Normal judgment and insight. Alert and oriented x 3. Normal mood.     Labs on Admission: I have personally reviewed following labs and imaging studies    CBC:  Recent Labs Lab Mar 26, 2016 0610  WBC 6.4  HGB 9.0*  HCT 29.5*  MCV 89.7  PLT 130    Basic Metabolic Panel:  Recent Labs Lab 2016-03-26 0610  NA 140  K 3.8  CL 104  CO2 22  GLUCOSE 241*  BUN 78*  CREATININE 3.73*  CALCIUM 9.0    GFR: Estimated Creatinine Clearance: 6.8 mL/min (by C-G formula based on Cr of 3.73).  Liver Function Tests:  Recent Labs Lab Mar 26, 2016 0610  AST 52*  ALT 41  ALKPHOS 60  BILITOT 0.7  PROT 6.6  ALBUMIN 3.4*   Coagulation Profile:  Recent Labs Lab 03-26-2016 0610  INR 1.21    Sepsis Labs: '@LABRCNTIP'$ (procalcitonin:4,lacticidven:4) )No results found for this or any previous visit (from the past 240 hour(s)).   Radiological Exams on Admission: Dg Chest Portable 1 View  2016/03/26  CLINICAL DATA:  Generalized chest pain, nausea, and shortness of  breath for unknown period of time. History of CHF, hypertension, coronary disease, and diabetes. Nonsmoker. EXAM: PORTABLE CHEST 1 VIEW COMPARISON:  01/18/2016 FINDINGS: Postoperative changes in the mediastinum. Cardiac enlargement with pulmonary vascular congestion and diffuse interstitial edema. Pattern is similar to previous study. No blunting of costophrenic angles. No pneumothorax. Mediastinal contours appear intact. Calcified and tortuous aorta. IMPRESSION: Cardiac enlargement with pulmonary vascular congestion and diffuse interstitial edema similar to previous study. Electronically Signed   By: Lucienne Capers M.D.   On: 2016/03/26 06:49    EKG: Independently reviewed.  Assessment/Plan Principal Problem:   CHF exacerbation (HCC) Active Problems:   Diabetes ([place])   Hypertensive heart disease with CHF (congestive heart failure) (HCC)   MYOCARDIAL INFARCTION, HX OF   Congestive heart failure (HCC)   Chronic kidney disease, stage IV (severe) (HCC)   PVD (peripheral vascular disease) (HCC)   CAD (coronary artery disease)   Diabetes mellitus with nephropathy (HCC)   HLD (hyperlipidemia)   S/P AKA (above knee amputation) unilateral (HCC)   Protein-calorie malnutrition, severe (HCC)   Anemia    Acute respiratory failure likely due to Acute on chronic diastolic heart failure, rule out underlying infectious process. Patient with acute onset of symptoms with PND, orthopnea and dyspnea on exertion without weight gain and appears to be related to heart failure. BNP 3876  CXR Cardiac enlargement with pulmonary vascular congestion and diffuse interstitial edema .EKG SR QTC 458, no significant change from prior. She had Lasix IV 60 mg with some improvement of symptoms. Inpatient telemetry -Lasix 60 mg IV every 12 hours -Daily weights and strict I/O   Monitor BNP Continue BP meds except ACEI due to kidney failure Continue BiPAP for now IV antibiotic coverage with Zosyn and Vanc x1 after BC  drawn Urine Culture pending  Type II Diabetes Home regimen includes Current blood sugar level is  Lab Results  Component Value Date  HGBA1C 7.1* 01/17/2016  Hold home oral diabetic medications.   SSI Heart healthy carb modified diet.  Hyperlipidemia Continue home statins   Hypertension BP 189/62 mmHg  Pulse 79  Temp(Src) 97.8 F (36.6 C) (Axillary)  Resp 24  SpO2 100% Continue home anti-hypertensive medications.  Add Hydralazine Q6 hours as needed for SBP >160 and /or DBP >110.   Chronic kidney disease  EGFR 11 St V but still making urine, baseline creatinine 3.5-4   -  Minimize nephrotoxins and renally dose medications Lab Results  Component Value Date   CREATININE 3.73* 03/20/2016   CREATININE 4.59* 02/01/2016   CREATININE 5.2* 01/27/2016    Deconditioning Consult PT/OT/Nutrition  in view of LAKA and overall clinical status and poor nutritional status  Goals of Care After discussion with family and patient, she will be placed as DNR.  May consider Palliative Care consult while in hospital   DVT prophylaxis Heparin  Code Status:   DNR Family Communication:  Discussed with family.  Disposition Plan: Expect patient to be discharged to home Consults called:    None Admission status:  Inpatient tele    [facility] E, PA-C [facility]   If 7PM-7AM, please contact night-coverage [email or web address] Password [facility]  03/20/2016, 8:37 AM

## 2016-03-21 ENCOUNTER — Inpatient Hospital Stay (HOSPITAL_COMMUNITY): Payer: Medicare Other

## 2016-03-21 DIAGNOSIS — N185 Chronic kidney disease, stage 5: Secondary | ICD-10-CM

## 2016-03-21 DIAGNOSIS — D638 Anemia in other chronic diseases classified elsewhere: Secondary | ICD-10-CM

## 2016-03-21 DIAGNOSIS — Z515 Encounter for palliative care: Secondary | ICD-10-CM

## 2016-03-21 LAB — GLUCOSE, CAPILLARY
GLUCOSE-CAPILLARY: 95 mg/dL (ref 65–99)
GLUCOSE-CAPILLARY: 86 mg/dL (ref 65–99)
GLUCOSE-CAPILLARY: 152 mg/dL — AB (ref 65–99)
Glucose-Capillary: 107 mg/dL — ABNORMAL HIGH (ref 65–99)
Glucose-Capillary: 153 mg/dL — ABNORMAL HIGH (ref 65–99)

## 2016-03-21 LAB — URINE CULTURE

## 2016-03-21 LAB — BRAIN NATRIURETIC PEPTIDE: B Natriuretic Peptide: 2438.6 pg/mL — ABNORMAL HIGH (ref 0.0–100.0)

## 2016-03-21 MED ORDER — FUROSEMIDE 10 MG/ML IJ SOLN
40.0000 mg | Freq: Two times a day (BID) | INTRAMUSCULAR | Status: DC
  Administered 2016-03-21 – 2016-03-22 (×2): 40 mg via INTRAVENOUS
  Filled 2016-03-21 (×2): qty 4

## 2016-03-21 NOTE — Progress Notes (Signed)
Patient ID: Adrienne Bowman, female   DOB: March 13, 1918, 82 y.o.   MRN: [id number]  PROGRESS NOTE    Adrienne Bowman  WUJ:[id number] DOB: March 13, 1918 DOA: 03/20/2016  PCP: [name], MD  Outpatient Specialists: Cardiology, Nada BoozerIngold laura, NP  Brief Narrative:  82 y.o. female with medical history significant forchronic diastolic congestive heart failure, last 2-D echo March 2017 with ejection fraction 55% and grade 1 diastolic dysfunction, coronary artery disease, left AKA, hypertension, dyslipidemia. [facility] reports of increasing shortness of breath receded by chest pain the night prior to this admission which woke her up from sleep. Chest pain was sharp, 10 out of 10 in intensity in midsternal area, nonradiating. Pain subsequently subsided with analgesia and nitroglycerin given in ED. Patient had some nausea but no associated vomiting. In ED, blood pressure was 180/66, heart rate 79. Patient was afebrile and oxygen saturation was 100% on room air. Blood work was notable for hemoglobin of 9, BNP 3876, CO2 45. Chest x-ray showed cardiac enlargement with pulmonary vascular congestion and diffuse interstitial edema. The 12-lead EKG showed sinus rhythm and no significant changes since prior EKG.   Assessment & Plan:   Acute respiratory failure likely due to acute on chronic diastolic heart failure - Shortness of breath on the admission likely secondary to acute decompensated diastolic heart failure - Last 2-D echo in March 2017 showed ejection fraction 55% with grade 1 diastolic dysfunction - Chest x-ray shows cardiac enlargement, pulmonary vascular congestion and diffuse interstitial edema - BNP on the admission was 3876 - Lasix 60 mg IV given in ED x 2 dose - Will resume Lasix 40 mg IV every 12 hours. Please note a baseline creatinine is 4.9 about one month ago and on this admission 3.73. If creatinine trends up then we'll consider stopping Lasix. - Continue daily  weight and strict intake and output - We will stop Zosyn. Please note patient was given vancomycin in ED and Zosyn but there is no evidence of acute infectious process, no fever and white blood cell count is within normal limits. - Continue aspirin daily - Continue carvedilol 25 mg twice daily - Palliative care consulted for goals of care  Essential hypertension - Continue Norvasc 10 mg daily, carvedilol 25 mg twice daily, clonidine 0.3 mg weekly patch, hydralazine 100 mg 3 times daily and metoprolol 5 mg IV every 6 hours - BP 177/51 at this time   Type II Diabetes, controlled with renal manifestations  - A1c is 7.1 in March 2017 - Patient not on any antihyperglycemic medications  Hyperlipidemia - Continue Lipitor  Chronic kidney disease stage V - Baseline creatinine 4.9 about one month ago - Creatinine on this admission 3.73 - Continue to monitor renal function while patient on Lasix  Anemia of chronic kidney disease - Hemoglobin stable at 9 - No current indications for transfusion   DVT prophylaxis: Heparin subcutaneous Code Status: DNR/DNI  Family Communication: Family not at the bedside this morning Disposition Plan: Appreciate palliative care and physical therapy evaluation for safe discharge planning   Consultants:   Palliative care   Physical therapy  Procedures:   None   Antimicrobials:   Vancomycin given one-time dose 03/20/2016  Zosyn 03/20/2016 --> 03/21/2016   Subjective: No overnight events.  Objective: Filed Vitals:   03/21/16 [id number] 03/21/16 0724 03/21/16 0737 03/21/16 0738  BP: 161/48  177/51   Pulse: 76 73 77   Temp:  98.6 F (37 C)  98.6 F (37  C)  TempSrc:  Oral  Oral  Resp: 20 20 21    Height:      Weight:      SpO2: 99% 98% 100%     Intake/Output Summary (Last 24 hours) at 03/21/16 1011 Last data filed at 03/21/16 0821  Gross per 24 hour  Intake    340 ml  Output   1525 ml  Net  -1185 ml   Filed Weights   03/20/16 1258  03/21/16 0457  Weight: 48.399 kg (106 lb 11.2 oz) 47.492 kg (104 lb 11.2 oz)    Examination:  General exam: Appears calm and comfortable  Respiratory system: decreased breath sounds, coarse breath sounds  Cardiovascular system: S1 & S2 heard, RRR. No JVD, murmurs. No pedal edema. Gastrointestinal system: Abdomen is nondistended, soft and nontender. No organomegaly or masses felt. Normal bowel sounds heard. Central nervous system: Sleeping. No focal neurological deficits. Extremities: left AKA, palpable pulses on RLE Skin: No rashes, lesions or ulcers Psychiatry: Unable to test pt sleeping    Data Reviewed: I have personally reviewed following labs and imaging studies  CBC:  Recent Labs Lab 03/20/16 0610  WBC 6.4  HGB 9.0*  HCT 29.5*  MCV 89.7  PLT 170   Basic Metabolic Panel:  Recent Labs Lab 03/20/16 0610  NA 140  K 3.8  CL 104  CO2 22  GLUCOSE 241*  BUN 78*  CREATININE 3.73*  CALCIUM 9.0   GFR: Estimated Creatinine Clearance: 6.8 mL/min (by C-G formula based on Cr of 3.73). Liver Function Tests:  Recent Labs Lab 03/20/16 0610  AST 52*  ALT 41  ALKPHOS 60  BILITOT 0.7  PROT 6.6  ALBUMIN 3.4*   No results for input(s): LIPASE, AMYLASE in the last 168 hours. No results for input(s): AMMONIA in the last 168 hours. Coagulation Profile:  Recent Labs Lab 03/20/16 0610  INR 1.21   Cardiac Enzymes: No results for input(s): CKTOTAL, CKMB, CKMBINDEX, TROPONINI in the last 168 hours. BNP (last 3 results) No results for input(s): PROBNP in the last 8760 hours. HbA1C: No results for input(s): HGBA1C in the last 72 hours. CBG:  Recent Labs Lab 03/20/16 1606 03/20/16 2105 03/21/16 0550 03/21/16 0738  GLUCAP 125* 95 95 86   Lipid Profile: No results for input(s): CHOL, HDL, LDLCALC, TRIG, CHOLHDL, LDLDIRECT in the last 72 hours. Thyroid Function Tests: No results for input(s): TSH, T4TOTAL, FREET4, T3FREE, THYROIDAB in the last 72 hours. Anemia  Panel: No results for input(s): VITAMINB12, FOLATE, FERRITIN, TIBC, IRON, RETICCTPCT in the last 72 hours. Urine analysis:    Component Value Date/Time   COLORURINE YELLOW 03/20/2016 0833   APPEARANCEUR CLEAR 03/20/2016 0833   LABSPEC 1.013 03/20/2016 0833   PHURINE 5.5 03/20/2016 0833   GLUCOSEU NEGATIVE 03/20/2016 0833   HGBUR NEGATIVE 03/20/2016 0833   BILIRUBINUR NEGATIVE 03/20/2016 0833   KETONESUR NEGATIVE 03/20/2016 0833   PROTEINUR 100* 03/20/2016 0833   NITRITE NEGATIVE 03/20/2016 0833   LEUKOCYTESUR NEGATIVE 03/20/2016 0833   Sepsis Labs: @LABRCNTIP (procalcitonin:4,lacticidven:4) Recent Results (from the past 240 hour(s))  MRSA PCR Screening     Status: None   Collection Time: 03/20/16  1:43 PM  Result Value Ref Range Status   MRSA by PCR NEGATIVE NEGATIVE Final      Radiology Studies: Dg Chest Port 1 View 03/21/2016  mproving CHF pattern. Electronically Signed   By: Judie Petit.  Shick M.D.   On: 03/21/2016 08:32   Dg Chest Portable 1 View 03/20/2016  Cardiac enlargement with pulmonary  vascular congestion and diffuse interstitial edema similar to previous study. Electronically Signed   By: Burman Nieves M.D.   On: 03/20/2016 06:49       Scheduled Meds: . amLODipine  10 mg Oral Daily  . aspirin EC  81 mg Oral Daily  . atorvastatin  10 mg Oral Daily  . carvedilol  25 mg Oral BID WC  . cloNIDine  0.3 mg Transdermal Weekly  . famotidine  20 mg Oral Daily  . heparin  5,000 Units Subcutaneous q12n4p  . hydrALAZINE  100 mg Oral TID  . insulin aspart  0-9 Units Subcutaneous TID WC  . metoprolol  5 mg Intravenous Q6H  . piperacillin-tazobactam (ZOSYN)  IV  2.25 g Intravenous Q8H  . sodium chloride flush  3 mL Intravenous Q12H   Continuous Infusions:    LOS: 1 day    Time spent: 25 minutes    Manson Passey, MD [facility] Pager [phone]  If 7PM-7AM, please contact night-coverage [email or web address] Password [email or web address] 03/21/2016, 10:11 AM

## 2016-03-21 NOTE — Consult Note (Signed)
Consultation Note Date: 03/21/2016   Patient Name: Adrienne Bowman  DOB: 12/16/1917  MRN: [id number]  Age / Sex: 82 y.o., female  PCP: Dianne Dun, MD Referring Physician: Alison Murray, MD  Reason for Consultation: Establishing goals of care  HPI/Patient Profile: 82 y.o. female  with past medical history of CHF HTN, HLD, CKD, DM2, CAD, anemia, h/o L AKA  admitted on 03/20/2016 with SOB and chest pain.   Clinical Assessment and Goals of Care: Adrienne Bowman is very lethargic today and deeply sleeping. She did not awaken to gentle stimulation. I did speak with her daughter, Adrienne Bowman, regarding how Adrienne Bowman has been at home PTA. Adrienne Bowman tells me that she has done well after her recent hospitalization last month in a rehab stay and was able to return home. Adrienne Bowman says that family are "always in and out" of her home as they live very close by. Ms. Sansone does not do much at home currently but they are happy that they have been able to keep her home. I did see notes of family trying to get refills on her cardiac meds shortly before this hospitalization but Adrienne Bowman confirms that she did not run out of medications. I did explain the natural progression of Ms. Adrienne Bowman's CHF and CAD in relation to this admission and future expectations. I also see a note from palliative care on last admission regarding hospice and family open to this after rehab. I discussed hospice support and philosophy with Adrienne Bowman. We agree to meet tomorrow with Adrienne Bowman's daughters to discuss hospice and support moving forward.   NEXT OF KIN - daughters - to meet with them tomorrow    SUMMARY OF RECOMMENDATIONS   - Will meet family tomorrow and plan to discuss hospice and MOST form  Code Status/Advance Care Planning:  DNR   Symptom Management:   No current symptoms to manage. Will reassess tomorrow.   Palliative Prophylaxis:   Bowel Regimen,  Delirium Protocol and Frequent Pain Assessment   Psycho-social/Spiritual:   Desire for further Chaplaincy support:no  Additional Recommendations: Caregiving  Support/Resources and Education on Hospice  Prognosis:   < 6 months  Discharge Planning: To be determined. If enough support at home I recommend home with hospice - will discuss further with family tomorrow.      Primary Diagnoses: Present on Admission:  . HLD (hyperlipidemia) . Hypertensive heart disease with CHF (congestive heart failure) (HCC) . Chronic kidney disease, stage IV (severe) (HCC) . PVD (peripheral vascular disease) (HCC) . CAD (coronary artery disease) . Protein-calorie malnutrition, severe (HCC) . Anemia . Diabetes mellitus with nephropathy (HCC) . Acute on chronic diastolic (congestive) heart failure (HCC)  I have reviewed the medical record, interviewed the patient and family, and examined the patient. The following aspects are pertinent.  Past Medical History  Diagnosis Date  . CHF (congestive heart failure) (HCC) 2002    acute CHF while getting lower extremilty arteriograms /  Led to CABG  x6  . Hypertension  very difficult to control  . PVD (peripheral vascular disease) (HCC)     extensive PVD / left AKA  . Murmur, heart     soft systolic outflow murmur and a grade 2/6 murmur of the aortic insufficiency  . S/P AKA (above knee amputation) (HCC)     left AKA with prosthesis  . DIABETES MELLITUS, TYPE II   . NEPHROPATHY, DIABETIC   . Coronary artery disease 2002    CABG x6 / left internal mammary artery graft to the LAD, a saphenous vein graft to the diagonal, a sequential saphenous vein graft to the 1st and 2nd obtuse marginal branches, and a sequential saphenous vein graft the the distal right coronary artery and posterior descending  . Anemia   . CKD (chronic kidney disease)   . Advanced age    Social History   Social History  . Marital Status: Widowed    Spouse Name: N/A  . Number  of Children: N/A  . Years of Education: N/A   Social History Main Topics  . Smoking status: Never Smoker   . Smokeless tobacco: Never Used  . Alcohol Use: No  . Drug Use: No  . Sexual Activity: No   Other Topics Concern  . None   Social History Narrative   Family History  Problem Relation Age of Onset  . Coronary artery disease      prevalent in sibling  . Heart disease Mother   . Hypertension Daughter   . Cancer Son     Lung  and  Throat  . Heart attack Son   . Hyperlipidemia Daughter   . Hypertension Daughter   . Hypertension Daughter   . Hypertension Daughter    Scheduled Meds: . amLODipine  10 mg Oral Daily  . aspirin EC  81 mg Oral Daily  . atorvastatin  10 mg Oral Daily  . carvedilol  25 mg Oral BID WC  . cloNIDine  0.3 mg Transdermal Weekly  . famotidine  20 mg Oral Daily  . furosemide  40 mg Intravenous Q12H  . heparin  5,000 Units Subcutaneous q12n4p  . hydrALAZINE  100 mg Oral TID  . insulin aspart  0-9 Units Subcutaneous TID WC  . metoprolol  5 mg Intravenous Q6H  . sodium chloride flush  3 mL Intravenous Q12H   Continuous Infusions:  PRN Meds:.sodium chloride, hydrALAZINE, ipratropium-albuterol, nitroGLYCERIN, sodium chloride flush Medications Prior to Admission:  Prior to Admission medications   Medication Sig Start Date End Date Taking? Authorizing Provider  amLODipine (NORVASC) 10 MG tablet Take 1 tablet (10 mg total) by mouth daily. 03/16/16  Yes Rollene Rotunda, MD  aspirin EC 81 MG tablet Take 81 mg by mouth daily.   Yes Historical Provider, MD  atorvastatin (LIPITOR) 10 MG tablet Take 1 tablet (10 mg total) by mouth daily. 03/16/16  Yes Rollene Rotunda, MD  carvedilol (COREG) 25 MG tablet Take 1 tablet (25 mg total) by mouth 2 (two) times daily with a meal. Patient taking differently: Take 25 mg by mouth 2 (two) times daily with a meal. For CHF 08/20/15  Yes Dwana Melena, PA-C  cloNIDine (CATAPRES - DOSED IN MG/24 HR) 0.3 mg/24hr patch Place 1 patch  (0.3 mg total) onto the skin once a week. 03/16/16  Yes Rollene Rotunda, MD  docusate sodium (COLACE) 100 MG capsule Take 1 capsule (100 mg total) by mouth daily. 01/28/16  Yes Dinah C Ngetich, NP  famotidine (PEPCID) 20 MG tablet TAKE ONE TABLET BY MOUTH  ONCE DAILY 12/07/15  Yes Dianne Dunalia M Aron, MD  ferrous sulfate 325 (65 FE) MG tablet Take 1 tablet (325 mg total) by mouth daily with breakfast. 01/28/16  Yes Dinah C Ngetich, NP  furosemide (LASIX) 40 MG tablet TAKE ONE TABLET BY MOUTH ONCE DAILY 12/07/15  Yes Dianne Dunalia M Aron, MD  hydrALAZINE (APRESOLINE) 100 MG tablet TAKE ONE TABLET BY MOUTH THREE TIMES DAILY 12/07/15  Yes Rollene RotundaJames Hochrein, MD  Multiple Vitamin (MULTIVITAMIN WITH MINERALS) TABS tablet Take 1 tablet by mouth daily.   Yes Historical Provider, MD  nitroGLYCERIN (NITROSTAT) 0.4 MG SL tablet Place 1 tablet (0.4 mg total) under the tongue every 5 (five) minutes as needed for chest pain. 07/13/15  Yes Renae FickleMackenzie Short, MD  UNABLE TO FIND Med Name: Med pass 120 mL by mouth twice daily between meals as supplement   Yes Historical Provider, MD   No Known Allergies Review of Systems  Unable to perform ROS: Mental status change    Physical Exam  Constitutional: She appears lethargic.  Thin, frail   Cardiovascular: Normal rate.   Pulmonary/Chest: No accessory muscle usage. No tachypnea. No respiratory distress.  Abdominal: Normal appearance.  Neurological: She appears lethargic.    Vital Signs: BP 141/55 mmHg  Pulse 66  Temp(Src) 98.6 F (37 C) (Oral)  Resp 23  Ht 5\' 2"  (1.575 m)  Wt 47.492 kg (104 lb 11.2 oz)  BMI 19.15 kg/m2  SpO2 100% Pain Assessment: No/denies pain   Pain Score: 0-No pain   SpO2: SpO2: 100 % O2 Device:SpO2: 100 % O2 Flow Rate: .O2 Flow Rate (L/min): 3 L/min  IO: Intake/output summary:  Intake/Output Summary (Last 24 hours) at 03/21/16 1437 Last data filed at 03/21/16 1215  Gross per 24 hour  Intake    240 ml  Output   1650 ml  Net  -1410 ml    LBM: Last BM  Date: 03/20/16 Baseline Weight: Weight: 48.399 kg (106 lb 11.2 oz) Most recent weight: Weight: 47.492 kg (104 lb 11.2 oz)     Palliative Assessment/Data:   Flowsheet Rows        Most Recent Value   Intake Tab    Referral Department  Hospitalist   Unit at Time of Referral  Cardiac/Telemetry Unit   Palliative Care Primary Diagnosis  Cardiac   Date Notified  03/21/16   Palliative Care Type  Return patient Palliative Care   Reason for referral  Clarify Goals of Care   Date of Admission  03/20/16   # of days IP prior to Palliative referral  1   Clinical Assessment    Psychosocial & Spiritual Assessment    Palliative Care Outcomes       Time In: 1400 Time Out: 1450 Time Total: 50min Greater than 50%  of this time was spent counseling and coordinating care related to the above assessment and plan.  Signed by: Ulice BoldParker, Kourtney Montesinos C, NP   Please contact Palliative Medicine Team phone at [phone] for questions and concerns.  For individual provider: See Loretha StaplerAmion

## 2016-03-22 DIAGNOSIS — E785 Hyperlipidemia, unspecified: Secondary | ICD-10-CM

## 2016-03-22 LAB — BASIC METABOLIC PANEL
Anion gap: 17 — ABNORMAL HIGH (ref 5–15)
BUN: 95 mg/dL — AB (ref 6–20)
CALCIUM: 8.1 mg/dL — AB (ref 8.9–10.3)
CHLORIDE: 100 mmol/L — AB (ref 101–111)
CO2: 24 mmol/L (ref 22–32)
CREATININE: 4.01 mg/dL — AB (ref 0.44–1.00)
GFR calc Af Amer: 10 mL/min — ABNORMAL LOW (ref >60)
GFR calc non Af Amer: 9 mL/min — ABNORMAL LOW (ref >60)
GLUCOSE: 108 mg/dL — AB (ref 65–99)
Potassium: 3.6 mmol/L (ref 3.5–5.1)
Sodium: 141 mmol/L (ref 135–145)

## 2016-03-22 LAB — GLUCOSE, CAPILLARY
GLUCOSE-CAPILLARY: 103 mg/dL — AB (ref 65–99)
GLUCOSE-CAPILLARY: 157 mg/dL — AB (ref 65–99)
GLUCOSE-CAPILLARY: 210 mg/dL — AB (ref 65–99)
Glucose-Capillary: 83 mg/dL (ref 65–99)

## 2016-03-22 NOTE — Progress Notes (Signed)
Daily Progress Note   Patient Name: Adrienne Bowman       Date: 03/22/2016 DOB: [birth year]  Age: 82 y.o. MRN#: [id number] Attending Physician: Robbie Lis, MD Primary Care Physician: Arnette Norris, MD Admit Date: 03/20/2016  Reason for Consultation/Follow-up: Establishing goals of care   Subjective: I met today with Ms. Winfield along with her daughters, Ms. Lacinda Axon and Ms. Hines. Ms. Rouillard is sitting in recliner, smiling, and laughing. She tells me that she has no discomfort and no worries. We discuss the natural progression of her heart disease and the symptoms she has experienced and the difficult balance with her advanced kidney disease as well. Discussed management of heart failure. We also discussed hospice but they do not feel ready for this quite yet. Ms. Longmire speaks freely about dying and understands that this is part of life. We discussed the benefit of hospice and when this can be useful for them. Hard Choices given to daughter. DNR confirmed. They are interested in outpatient palliative care.   Length of Stay: 2  Current Medications: Scheduled Meds:  . amLODipine  10 mg Oral Daily  . aspirin EC  81 mg Oral Daily  . atorvastatin  10 mg Oral Daily  . carvedilol  25 mg Oral BID WC  . cloNIDine  0.3 mg Transdermal Weekly  . famotidine  20 mg Oral Daily  . heparin  5,000 Units Subcutaneous q12n4p  . hydrALAZINE  100 mg Oral TID  . insulin aspart  0-9 Units Subcutaneous TID WC  . sodium chloride flush  3 mL Intravenous Q12H    Continuous Infusions:    PRN Meds: sodium chloride, hydrALAZINE, ipratropium-albuterol, nitroGLYCERIN, sodium chloride flush  Physical Exam  Constitutional: She is oriented to person, place, and time. She appears well-developed.  HENT:  Head: Normocephalic and  atraumatic.  Cardiovascular: Normal rate.   Pulmonary/Chest: No accessory muscle usage. No tachypnea. No respiratory distress.  Room air   Abdominal: Soft. Normal appearance.  Neurological: She is alert and oriented to person, place, and time.            Vital Signs: BP 133/48 mmHg  Pulse 77  Temp(Src) 98.2 F (36.8 C) (Oral)  Resp 16  Ht _0  (1.575 m)  Wt 46.267 kg (102 lb)  BMI 18.65 kg/m2  SpO2 98% SpO2:  SpO2: 98 % O2 Device: O2 Device: Not Delivered O2 Flow Rate: O2 Flow Rate (L/min): 2 L/min  Intake/output summary:  Intake/Output Summary (Last 24 hours) at 03/22/16 1529 Last data filed at 03/22/16 1225  Gross per 24 hour  Intake    540 ml  Output    800 ml  Net   -260 ml   LBM: Last BM Date: 03/20/16 Baseline Weight: Weight: 48.399 kg (106 lb 11.2 oz) Most recent weight: Weight: 46.267 kg (102 lb)       Palliative Assessment/Data:    Flowsheet Rows        Most Recent Value   Intake Tab    Referral Department  Hospitalist   Unit at Time of Referral  Cardiac/Telemetry Unit   Palliative Care Primary Diagnosis  Cardiac   Date Notified  03/21/16   Palliative Care Type  Return patient Palliative Care   Reason for referral  Clarify Goals of Care   Date of Admission  03/20/16   # of days IP prior to Palliative referral  1   Clinical Assessment    Psychosocial & Spiritual Assessment    Palliative Care Outcomes       Patient Active Problem List   Diagnosis Date Noted  . Palliative care encounter   . CHF exacerbation ([place]) 03/20/2016  . Acute on chronic diastolic (congestive) heart failure ([place]) 03/20/2016  . CHF (congestive heart failure) ([place]) 03/20/2016  . Anemia 02/01/2016  . Protein-calorie malnutrition, severe ([place]) 01/20/2016  . Goals of care, counseling/discussion   . Encounter for hospice care discussion   . Abnormal chest x-ray 01/17/2016  . Hyperlipidemia   . Elevated troponin 01/16/2016  . Chronic kidney disease (CKD), stage IV (severe)  ([place]) 07/21/2015  . NSTEMI (non-ST elevated myocardial infarction) ([place]) 07/13/2015  . Chest pain 07/12/2015  . Diabetes mellitus with nephropathy ([place]) 07/12/2015  . S/P CABG (coronary artery bypass graft) 07/12/2015  . HLD (hyperlipidemia) 07/12/2015  . S/P AKA (above knee amputation) unilateral ([place]) 07/12/2015  . Occlusion and stenosis of carotid artery without mention of cerebral infarction 01/28/2014  . Encounter for consultation 03/18/2013  . Peripheral vascular disease, unspecified ([place]) 09/12/2012  . Atherosclerotic PVD with ulceration ([place]) 06/12/2012  . Atherosclerosis of native arteries of the extremities with intermittent claudication 02/14/2012  . Hx of leg amputation 08/22/2011  . Impaired mobility 08/22/2011  . Leg weakness 08/05/2011  . CAD (coronary artery disease) 05/30/2011  . PVD (peripheral vascular disease) ([place])   . Murmur, heart   . CONSTIPATION 08/16/2010  . Chronic kidney disease, stage IV (severe) ([place]) 02/01/2010  . Diabetes ([place]) 05/02/2007  . NEPHROPATHY, DIABETIC 05/02/2007  . Hypertensive heart disease with CHF (congestive heart failure) ([place]) 05/02/2007  . MYOCARDIAL INFARCTION, HX OF 05/02/2007  . Congestive heart failure ([place]) 05/02/2007  . PULMONARY EDEMA 05/02/2007  . HEMOCCULT POSITIVE STOOL 05/02/2007    Palliative Care Assessment & Plan   Patient Profile: 82 y.o. female with past medical history of CHF HTN, HLD, CKD, DM2, CAD, anemia, h/o L AKA admitted on 03/20/2016 with SOB and chest pain.   Recommendations/Plan:  Denies symptoms. SOB and chest pain resolved.   Goals of Care and Additional Recommendations:  Limitations on Scope of Treatment: Full Scope Treatment  Code Status:    Code Status Orders        Start     Ordered   03/20/16 0937  Do not attempt resuscitation (DNR)   Continuous    Question Answer Comment  In  the event of cardiac or respiratory ARREST Do not call a "code blue"   In the event of cardiac or  respiratory ARREST Do not perform Intubation, CPR, defibrillation or ACLS   In the event of cardiac or respiratory ARREST Use medication by any route, position, wound care, and other measures to relive pain and suffering. May use oxygen, suction and manual treatment of airway obstruction as needed for comfort.      03/20/16 0950    Code Status History    Date Active Date Inactive Code Status Order ID Comments User Context   01/16/2016 11:29 PM 01/23/2016  5:08 PM DNR [id number]  Toy Baker, MD Inpatient   07/12/2015  3:54 PM 07/13/2015  8:26 PM DNR [id number]  Barton Dubois, MD Inpatient       Prognosis:   < 6 months with progressing heart failure and advanced kidney disease.   Discharge Planning:  Home with Palliative Services   Thank you for allowing the Palliative Medicine Team to assist in the care of this patient.   Time In: 1600 Time Out: 1645 Total Time 71mn Prolonged Time Billed  no       Greater than 50%  of this time was spent counseling and coordinating care related to the above assessment and plan.  PPershing Proud NP  Please contact Palliative Medicine Team phone at [phone] questions and concerns.

## 2016-03-22 NOTE — Progress Notes (Signed)
RT came to evaluate pt. Pt states she does not use oxygen at home. Pt on 2L with sats 100%, RR 14, NPC. Pt in the chair in no distress. BBS clear

## 2016-03-22 NOTE — Progress Notes (Addendum)
Weaned pt to RA due to stable sats. Pt on RA with sats 97%. Pt in no distress. Pt tol well. RN notified

## 2016-03-22 NOTE — Progress Notes (Addendum)
Patient ID: Adrienne HeirJessie B Schlemmer, female   DOB: [birth year], 82 y.o.   MRN: [id number]  PROGRESS NOTE    Adrienne Bowman  WUJ:[id number] DOB: [birth year] DOA: 03/20/2016  PCP: [name], MD  Outpatient Specialists: Cardiology, Nada BoozerIngold laura, NP  Brief Narrative:  82 y.o. female with medical history significant forchronic diastolic congestive heart failure, last 2-D echo March 2017 with ejection fraction 55% and grade 1 diastolic dysfunction, coronary artery disease, left AKA, hypertension, dyslipidemia. [facility] with reports of increasing shortness of breath receded by chest pain the night prior to this admission which woke her up from sleep. Chest pain was sharp, 10 out of 10 in intensity in midsternal area, nonradiating. Pain subsequently subsided with analgesia and nitroglycerin given in ED. Patient had some nausea but no associated vomiting.  In ED, blood pressure was 180/66, heart rate 79. Patient was afebrile and oxygen saturation was 100% on room air. Blood work was notable for hemoglobin of 9, BNP 3876, CO2 45. Chest x-ray showed cardiac enlargement with pulmonary vascular congestion and diffuse interstitial edema. The 12-lead EKG showed sinus rhythm and no significant changes since prior EKG.  Assessment & Plan:   Acute respiratory failure likely due to acute on chronic diastolic heart failure - Secondary to acute decompensated diastolic heart failure - Last 2-D echo in March 2017 showed ejection fraction 55% with grade 1 diastolic dysfunction - Chest x-ray showed cardiac enlargement, pulmonary vascular congestion and diffuse interstitial edema - BNP on the admission was 3876 - Please note patient was given Lasix 60 mg IV 2 doses in ED. Then we resumed Lasix 40 mg IV every 12 hours starting 03/21/2016. - Since creatinine is trending up Lasix stopped today. Monitor renal function. If improving then may resume Lasix again. - Her weight on the admission was 48.3 kg  and today 46.3 kg - Continue daily weight and strict intake and output - As previously noted, we stopped antibiotics because there is no fevers and no elevated white blood cell count and no evidence of pneumonia on chest x-ray. - Continue aspirin daily - Continue carvedilol 25 mg twice daily - Palliative care consulted for goals of care, we appreciate their input  Essential hypertension - Continue Norvasc 10 mg daily, carvedilol 25 mg twice daily, clonidine 0.3 mg weekly patch, hydralazine 100 mg 3 times daily  Type II Diabetes, controlled with renal manifestations  - A1c is 7.1 in March 2017 - Patient not on any antihyperglycemic medications  Hyperlipidemia - Continue Lipitor 10 mg at bedtime  Chronic kidney disease stage V - Baseline creatinine 4.9 about one month ago - Creatinine on this admission 3.73 and further trending up 4.01 this morning - Stop Lasix as it possibly is contributing to worsening renal function - Check BMP in am  Anemia of chronic kidney disease - Hemoglobin stable at 9 - No current indications for transfusion   DVT prophylaxis: Heparin subcutaneous Code Status: DNR/DNI  Family Communication: Family not at the bedside this morning Disposition Plan: Anticipated discharge by 03/24/2016   Consultants:   Palliative care   Physical therapy  Procedures:   None   Antimicrobials:   Vancomycin given one-time dose 03/20/2016  Zosyn 03/20/2016 --> 03/21/2016   Subjective: No overnight events. No respiratory distress.  Objective: Filed Vitals:   03/22/16 0815 03/22/16 1015 03/22/16 1024 03/22/16 1133  BP:    133/48  Pulse: 71 77 76 77  Temp:    98.2 F (36.8 C)  TempSrc:  Oral  Resp: Height:      Weight:      SpO2: 100% 100% 97% 98%    Intake/Output Summary (Last 24 hours) at 03/22/16 1153 Last data filed at 03/22/16 0839  Gross per 24 hour  Intake    300 ml  Output   1075 ml  Net   -775 ml   Filed Weights   03/20/16  1258 03/21/16 0457 03/22/16 0500  Weight: 48.399 kg (106 lb 11.2 oz) 47.492 kg (104 lb 11.2 oz) 46.267 kg (102 lb)    Examination:  General exam: Appears calm and comfortable  Respiratory system: decreased breath sounds, no wheezing  Cardiovascular system: S1 & S2 appreciated, rate controlled. Gastrointestinal system: (+) BS, non tender abdomen  Central nervous system: No focal neurological deficits. Extremities: left AKA, palpable pulses RLE Skin: No rashes, ulcers Psychiatry: Normal mood and behavior, not restless or agitated    Data Reviewed: I have personally reviewed following labs and imaging studies  CBC:  Recent Labs Lab 03/20/16 0610  WBC 6.4  HGB 9.0*  HCT 29.5*  MCV 89.7  PLT 170   Basic Metabolic Panel:  Recent Labs Lab 03/20/16 0610 03/22/16 0541  NA 140 141  K 3.8 3.6  CL 104 100*  CO2 22 24  GLUCOSE 241* 108*  BUN 78* 95*  CREATININE 3.73* 4.01*  CALCIUM 9.0 8.1*   GFR: Estimated Creatinine Clearance: 6.1 mL/min (by C-G formula based on Cr of 4.01). Liver Function Tests:  Recent Labs Lab 03/20/16 0610  AST 52*  ALT 41  ALKPHOS 60  BILITOT 0.7  PROT 6.6  ALBUMIN 3.4*   No results for input(s): LIPASE, AMYLASE in the last 168 hours. No results for input(s): AMMONIA in the last 168 hours. Coagulation Profile:  Recent Labs Lab 03/20/16 0610  INR 1.21   Cardiac Enzymes: No results for input(s): CKTOTAL, CKMB, CKMBINDEX, TROPONINI in the last 168 hours. BNP (last 3 results) No results for input(s): PROBNP in the last 8760 hours. HbA1C: No results for input(s): HGBA1C in the last 72 hours. CBG:  Recent Labs Lab 03/21/16 1130 03/21/16 1645 03/21/16 2108 03/22/16 0740 03/22/16 1131  GLUCAP 152* 107* 153* 103* 157*   Lipid Profile: No results for input(s): CHOL, HDL, LDLCALC, TRIG, CHOLHDL, LDLDIRECT in the last 72 hours. Thyroid Function Tests: No results for input(s): TSH, T4TOTAL, FREET4, T3FREE, THYROIDAB in the last 72  hours. Anemia Panel: No results for input(s): VITAMINB12, FOLATE, FERRITIN, TIBC, IRON, RETICCTPCT in the last 72 hours. Urine analysis:    Component Value Date/Time   COLORURINE YELLOW 03/20/2016 0833   APPEARANCEUR CLEAR 03/20/2016 0833   LABSPEC 1.013 03/20/2016 0833   PHURINE 5.5 03/20/2016 0833   GLUCOSEU NEGATIVE 03/20/2016 0833   HGBUR NEGATIVE 03/20/2016 0833   BILIRUBINUR NEGATIVE 03/20/2016 0833   KETONESUR NEGATIVE 03/20/2016 0833   PROTEINUR 100* 03/20/2016 0833   NITRITE NEGATIVE 03/20/2016 0833   LEUKOCYTESUR NEGATIVE 03/20/2016 0833   Sepsis Labs: (procalcitonin:4,lacticidven:4) Recent Results (from the past 240 hour(s))  MRSA PCR Screening     Status: None   Collection Time: 03/20/16  1:43 PM  Result Value Ref Range Status   MRSA by PCR NEGATIVE NEGATIVE Final      Radiology Studies: Dg Chest Port 1 View 03/21/2016  mproving CHF pattern. Electronically Signed   By: Judie Petit.  Shick M.D.   On: 03/21/2016 08:32   Dg Chest Portable 1 View 03/20/2016  Cardiac enlargement with pulmonary vascular congestion  and diffuse interstitial edema similar to previous study. Electronically Signed   By: Burman Nieves M.D.   On: 03/20/2016 06:49     Scheduled Meds: . amLODipine  10 mg Oral Daily  . aspirin EC  81 mg Oral Daily  . atorvastatin  10 mg Oral Daily  . carvedilol  25 mg Oral BID WC  . cloNIDine  0.3 mg Transdermal Weekly  . famotidine  20 mg Oral Daily  . furosemide  40 mg Intravenous Q12H  . heparin  5,000 Units Subcutaneous q12n4p  . hydrALAZINE  100 mg Oral TID  . insulin aspart  0-9 Units Subcutaneous TID WC   Continuous Infusions:    LOS: 2 days    Time spent: 25 minutes    Manson Passey, MD [facility] Pager [phone]  If 7PM-7AM, please contact night-coverage [email or web address] Password [email or web address] 03/22/2016, 11:53 AM

## 2016-03-22 NOTE — Evaluation (Signed)
Physical Therapy Evaluation Patient Details Name: Adrienne Bowman MRN: [id number] DOB: Jul 31, 1918 Today's Date: 03/22/2016   History of Present Illness  82 y.o. female with past medical history of CHF HTN, HLD, CKD, DM2, CAD, anemia, h/o L AKA admitted on 03/20/2016 with SOB and chest pain  Clinical Impression  Patient demonstrates deficits in functional mobility as indicated below. Will need continued skilled PT to address deficits and maximize function. Will see as indicated and progress as tolerated.    Follow Up Recommendations Home health PT (pending progression of activity tolerance, may not need);Supervision - Intermittent    Equipment Recommendations  None recommended by PT    Recommendations for Other Services       Precautions / Restrictions Precautions Precaution Comments: Left BKA Restrictions Weight Bearing Restrictions: No      Mobility  Bed Mobility Overal bed mobility: Modified Independent             General bed mobility comments: increased time to come to EOB, no physical assist required  Transfers Overall transfer level: Needs assistance Equipment used: Rolling walker (2 wheeled) Transfers: Sit to/from Stand Sit to Stand: Supervision         General transfer comment: no physical assist required, supervision for safety  Ambulation/Gait Ambulation/Gait assistance: Min guard Ambulation Distance (Feet): 40 Feet Assistive device: Rolling walker (2 wheeled) Gait Pattern/deviations: Step-to pattern Gait velocity: decreased   General Gait Details: patient with use of RW, hopping on right LE (did not have LLE prosthesis)  Stairs            Wheelchair Mobility    Modified Rankin (Stroke Patients Only)       Balance                                             Pertinent Vitals/Pain Pain Assessment: No/denies pain    Home Living Family/patient expects to be discharged to:: Private residence Living Arrangements:  Alone Available Help at Discharge: Family;Available PRN/intermittently Type of Home: House Home Access: Ramped entrance     Home Layout: One level Home Equipment: Walker - 2 wheels;Walker - 4 wheels;Bedside commode;Grab bars - toilet;Grab bars - tub/shower Additional Comments: Pt's daughter comes over sometimes after work to eat dinner w/ pt    Prior Function Level of Independence: Independent with assistive device(s)         Comments: Pt reports she uses her RW for short distance ambulation in home and has a WC that she can self-propel for farther distances.  Her daughter drives her.  She says she is independent w/ taking a bath and dressing.     Hand Dominance        Extremity/Trunk Assessment   Upper Extremity Assessment: Overall WFL for tasks assessed           Lower Extremity Assessment:  (left BKA )         Communication   Communication: No difficulties  Cognition Arousal/Alertness: Awake/alert Behavior During Therapy: WFL for tasks assessed/performed Overall Cognitive Status: Within Functional Limits for tasks assessed                      General Comments      Exercises        Assessment/Plan    PT Assessment Patient needs continued PT services  PT Diagnosis Difficulty walking   PT Problem  List Decreased activity tolerance;Decreased balance;Decreased mobility  PT Treatment Interventions DME instruction;Gait training;Functional mobility training;Therapeutic activities;Therapeutic exercise;Balance training;Cognitive remediation   PT Goals (Current goals can be found in the Care Plan section) Acute Rehab PT Goals Patient Stated Goal: to go home PT Goal Formulation: With patient Time For Goal Achievement: 03/29/16 Potential to Achieve Goals: Good    Frequency Min 3X/week   Barriers to discharge        Co-evaluation               End of Session Equipment Utilized During Treatment: Gait belt;Oxygen Activity Tolerance: Patient  tolerated treatment well Patient left: in chair;with call bell/phone within reach;with chair alarm set Nurse Communication: Mobility status         Time: [id number] PT Time Calculation (min) (ACUTE ONLY): 18 min   Charges:   PT Evaluation $PT Eval Moderate Complexity: 1 Procedure     PT G CodesFabio Asa:        Jolynda Townley J 03/22/2016, 4:23 PM Charlotte Crumbevon Martyna Thorns, PT DPT  [phone]

## 2016-03-23 DIAGNOSIS — I10 Essential (primary) hypertension: Secondary | ICD-10-CM

## 2016-03-23 LAB — GLUCOSE, CAPILLARY
GLUCOSE-CAPILLARY: 138 mg/dL — AB (ref 65–99)
GLUCOSE-CAPILLARY: 100 mg/dL — AB (ref 65–99)
Glucose-Capillary: 111 mg/dL — ABNORMAL HIGH (ref 65–99)
Glucose-Capillary: 158 mg/dL — ABNORMAL HIGH (ref 65–99)

## 2016-03-23 LAB — BASIC METABOLIC PANEL
Anion gap: 13 (ref 5–15)
BUN: 98 mg/dL — AB (ref 6–20)
CO2: 24 mmol/L (ref 22–32)
CREATININE: 4.34 mg/dL — AB (ref 0.44–1.00)
Calcium: 8.3 mg/dL — ABNORMAL LOW (ref 8.9–10.3)
Chloride: 104 mmol/L (ref 101–111)
GFR calc Af Amer: 9 mL/min — ABNORMAL LOW (ref >60)
GFR, EST NON AFRICAN AMERICAN: 8 mL/min — AB (ref >60)
GLUCOSE: 100 mg/dL — AB (ref 65–99)
POTASSIUM: 3.5 mmol/L (ref 3.5–5.1)
SODIUM: 141 mmol/L (ref 135–145)

## 2016-03-23 NOTE — Care Management Important Message (Signed)
Important Message  Patient Details  Name: Adrienne Bowman MRN: [id number] Date of Birth: 1918-02-22   Medicare Important Message Given:  Yes    Genae Strine Abena 03/23/2016, 1:17 PM

## 2016-03-23 NOTE — Progress Notes (Signed)
Patient ID: Adrienne Bowman, female   DOB: 1918/01/05, 82 y.o.   MRN: [id number]  PROGRESS NOTE    Adrienne Bowman  OVF:[id number] DOB: 1918/01/05 DOA: 03/20/2016  PCP: [name], MD  Outpatient Specialists: Cardiology, Nada BoozerIngold laura, NP  Brief Narrative:  82 y.o. female with medical history significant forchronic diastolic congestive heart failure, last 2-D echo March 2017 with ejection fraction 55% and grade 1 diastolic dysfunction, coronary artery disease, left AKA, hypertension, dyslipidemia. [facility] reports of increasing shortness of breath receded by chest pain the night prior to this admission which woke her up from sleep. Chest pain was sharp, 10 out of 10 in intensity in midsternal area, nonradiating. Pain subsequently subsided with analgesia and nitroglycerin given in ED. Patient had some nausea but no associated vomiting.  In ED, blood pressure was 180/66, heart rate 79. Patient was afebrile and oxygen saturation was 100% on room air. Blood work was notable for hemoglobin of 9, BNP 3876, CO2 45. Chest x-ray showed cardiac enlargement with pulmonary vascular congestion and diffuse interstitial edema. The 12-lead EKG showed sinus rhythm and no significant changes since prior EKG.  Assessment & Plan:   Acute respiratory failure likely due to acute on chronic diastolic heart failure - Secondary to acute decompensated diastolic heart failure - BNP on the admission was 3876 - Last 2-D echo in March 2017 showed ejection fraction 55% with grade 1 diastolic dysfunction - Chest x-ray showed cardiac enlargement, pulmonary vascular congestion and diffuse interstitial edema - Patient was given Lasix 60 mg IV 2 doses in ED. Then we resumed Lasix 40 mg IV every 12 hours starting 03/21/2016. Since creatinine trended up we stopped lasix 5/9.  - Weight in past 72 hours: 47.49 kg --> 46.2 kg --> 42.18 kg - Continue daily weight and strict intake and output - As  previously noted, we stopped antibiotics because there is no fevers and no elevated white blood cell count and no evidence of pneumonia on chest x-ray. - Continue aspirin daily - Continue carvedilol 25 mg twice daily - Appreciate palliative care following  - Per PT - HH PT recommended, order placed 5/10  Essential hypertension - Continue Norvasc 10 mg daily, carvedilol 25 mg twice daily, clonidine 0.3 mg weekly patch, hydralazine 100 mg 3 times daily - BP 154/43  Type II Diabetes, controlled with renal manifestations  - A1c is 7.1 in March 2017 - Not on any antihyperglycemic medications  Hyperlipidemia - Continue Lipitor 10 mg at bedtime  Chronic kidney disease stage V - Baseline creatinine 4.9 about one month ago - Creatinine on this admission 3.73 and further trending up 4.01 this morning likely from lasix which we placed on hold 5/9 - Cr this am 4.34 - Follow up BMP tomorrow am  Anemia of chronic kidney disease - Hemoglobin stable at 9 - No current indications for transfusion   DVT prophylaxis: subQ heparin  Code Status: DNR/DNI  Family Communication: Family not at the bedside this morning Disposition Plan: Anticipated discharge by 03/24/2016   Consultants:   Palliative care   Physical therapy  Procedures:   None   Antimicrobials:   Vancomycin given one-time dose 03/20/2016  Zosyn 03/20/2016 --> 03/21/2016   Subjective: Feels better.   Objective: Filed Vitals:   03/22/16 1133 03/22/16 1709 03/22/16 2042 03/23/16 0500  BP: 133/48 156/47 150/45 152/48  Pulse: 77 76 78 75  Temp: 98.2 F (36.8 C) 98.7 F (37.1 C) 99 F (37.2 C) 99  F (37.2 C)  TempSrc: Oral  Oral Oral  Resp: Height:      Weight:    42.185 kg (93 lb)  SpO2: 98% 98% 93% 94%    Intake/Output Summary (Last 24 hours) at 03/23/16 0959 Last data filed at 03/23/16 0900  Gross per 24 hour  Intake    600 ml  Output    900 ml  Net   -300 ml   Filed Weights   03/21/16 0457  03/22/16 0500 03/23/16 0500  Weight: 47.492 kg (104 lb 11.2 oz) 46.267 kg (102 lb) 42.185 kg (93 lb)    Examination:  General exam: Appears in no acute distress  Respiratory system: No wheezing, no rhonchi Cardiovascular system: S1 & S2 (+), RRR Gastrointestinal system: non tender abdomen, (+) BS Central nervous system: no focal neurologic deficits  Extremities: palpable pulses, left AKA Skin: No rashes, ulcers Psychiatry: Normal mood and behavior, not restless or agitated    Data Reviewed: I have personally reviewed following labs and imaging studies  CBC:  Recent Labs Lab 03/20/16 0610  WBC 6.4  HGB 9.0*  HCT 29.5*  MCV 89.7  PLT 170   Basic Metabolic Panel:  Recent Labs Lab 03/20/16 0610 03/22/16 0541 03/23/16 0529  NA 140 141 141  K 3.8 3.6 3.5  CL 104 100* 104  CO2 GLUCOSE 241* 108* 100*  BUN 78* 95* 98*  CREATININE 3.73* 4.01* 4.34*  CALCIUM 9.0 8.1* 8.3*   GFR: Estimated Creatinine Clearance: 5.2 mL/min (by C-G formula based on Cr of 4.34). Liver Function Tests:  Recent Labs Lab 03/20/16 0610  AST 52*  ALT 41  ALKPHOS 60  BILITOT 0.7  PROT 6.6  ALBUMIN 3.4*   No results for input(s): LIPASE, AMYLASE in the last 168 hours. No results for input(s): AMMONIA in the last 168 hours. Coagulation Profile:  Recent Labs Lab 03/20/16 0610  INR 1.21   Cardiac Enzymes: No results for input(s): CKTOTAL, CKMB, CKMBINDEX, TROPONINI in the last 168 hours. BNP (last 3 results) No results for input(s): PROBNP in the last 8760 hours. HbA1C: No results for input(s): HGBA1C in the last 72 hours. CBG:  Recent Labs Lab 03/22/16 0740 03/22/16 1131 03/22/16 1649 03/22/16 2053 03/23/16 0733  GLUCAP 103* 157* 83 210* 111*   Lipid Profile: No results for input(s): CHOL, HDL, LDLCALC, TRIG, CHOLHDL, LDLDIRECT in the last 72 hours. Thyroid Function Tests: No results for input(s): TSH, T4TOTAL, FREET4, T3FREE, THYROIDAB in the last 72  hours. Anemia Panel: No results for input(s): VITAMINB12, FOLATE, FERRITIN, TIBC, IRON, RETICCTPCT in the last 72 hours. Urine analysis:    Component Value Date/Time   COLORURINE YELLOW 03/20/2016 0833   APPEARANCEUR CLEAR 03/20/2016 0833   LABSPEC 1.013 03/20/2016 0833   PHURINE 5.5 03/20/2016 0833   GLUCOSEU NEGATIVE 03/20/2016 0833   HGBUR NEGATIVE 03/20/2016 0833   BILIRUBINUR NEGATIVE 03/20/2016 0833   KETONESUR NEGATIVE 03/20/2016 0833   PROTEINUR 100* 03/20/2016 0833   NITRITE NEGATIVE 03/20/2016 0833   LEUKOCYTESUR NEGATIVE 03/20/2016 0833   Sepsis Labs: (procalcitonin:4,lacticidven:4) Recent Results (from the past 240 hour(s))  MRSA PCR Screening     Status: None   Collection Time: 03/20/16  1:43 PM  Result Value Ref Range Status   MRSA by PCR NEGATIVE NEGATIVE Final      Radiology Studies: Dg Chest Port 1 View 03/21/2016  mproving CHF pattern. Electronically Signed   By: Judie Petit.  Shick M.D.   On:  03/21/2016 08:32   Dg Chest Portable 1 View 03/20/2016  Cardiac enlargement with pulmonary vascular congestion and diffuse interstitial edema similar to previous study. Electronically Signed   By: Burman Nieves M.D.   On: 03/20/2016 06:49     Scheduled Meds: . amLODipine  10 mg Oral Daily  . aspirin EC  81 mg Oral Daily  . atorvastatin  10 mg Oral Daily  . carvedilol  25 mg Oral BID WC  . cloNIDine  0.3 mg Transdermal Weekly  . famotidine  20 mg Oral Daily  . furosemide  40 mg Intravenous Q12H  . heparin  5,000 Units Subcutaneous q12n4p  . hydrALAZINE  100 mg Oral TID  . insulin aspart  0-9 Units Subcutaneous TID WC   Continuous Infusions:    LOS: 3 days    Time spent: 25 minutes    Manson Passey, MD [facility] Pager [phone]  If 7PM-7AM, please contact night-coverage [email or web address] Password [email or web address] 03/23/2016, 9:59 AM

## 2016-03-23 NOTE — Progress Notes (Signed)
Ms. Adrienne Bowman is sitting in bed eating a hamburger. She has no complaints. Looking forward to returning home. She does worry about having "another event" and exacerbation but does know that this happens with progression of her disease process. Emotional support provided. Hopefully home tomorrow with outpatient palliative care services.   Yong ChannelAlicia Ezella Kell, NP Palliative Medicine Team Pager # [phone] (M-F 8a-5p) Team Phone # [phone] (Nights/Weekends)

## 2016-03-24 DIAGNOSIS — Z89619 Acquired absence of unspecified leg above knee: Secondary | ICD-10-CM

## 2016-03-24 DIAGNOSIS — E43 Unspecified severe protein-calorie malnutrition: Secondary | ICD-10-CM

## 2016-03-24 LAB — BASIC METABOLIC PANEL
ANION GAP: 14 (ref 5–15)
BUN: 95 mg/dL — ABNORMAL HIGH (ref 6–20)
CO2: 25 mmol/L (ref 22–32)
Calcium: 8.7 mg/dL — ABNORMAL LOW (ref 8.9–10.3)
Chloride: 104 mmol/L (ref 101–111)
Creatinine, Ser: 4.07 mg/dL — ABNORMAL HIGH (ref 0.44–1.00)
GFR calc Af Amer: 10 mL/min — ABNORMAL LOW (ref >60)
GFR, EST NON AFRICAN AMERICAN: 8 mL/min — AB (ref >60)
Glucose, Bld: 103 mg/dL — ABNORMAL HIGH (ref 65–99)
POTASSIUM: 3.9 mmol/L (ref 3.5–5.1)
SODIUM: 143 mmol/L (ref 135–145)

## 2016-03-24 LAB — GLUCOSE, CAPILLARY
GLUCOSE-CAPILLARY: 100 mg/dL — AB (ref 65–99)
Glucose-Capillary: 158 mg/dL — ABNORMAL HIGH (ref 65–99)

## 2016-03-24 LAB — LEGIONELLA PNEUMOPHILA SEROGP 1 UR AG: L. pneumophila Serogp 1 Ur Ag: NEGATIVE

## 2016-03-24 NOTE — Care Management Note (Signed)
Case Management Note  Patient Details  Name: Adrienne Bowman Oertel MRN: [id number] Date of Birth: Jun 30, 1918  Subjective/Objective:  CHF                  Action/Plan: Discharge Planning: AVS reviewed:  NCM spoke to pt and Verne Spurrdtr, Alean Hines # [phone]. Offered choice for HH/[facility]. Pt refusing states she had HH RN/PT last month with [facility]. Dtr states they came out three weeks and pt completed HH. [facility]'s office can arrange. Pt is agreeable [facility]. Faxed referral to [facility] # [phone]. Contacted and spoke to referral coordinator and referral was received. Provided pt's dtr, Jeri CosAlva Cook as a contact also. Pt has RW and 3n1 at home. States she is independent at home and has good family support.   Smitty CordsPCP-ARON, TALIA M MD   Expected Discharge Date:  03/24/2016               Expected Discharge Plan:  Home/Self Care  In-House Referral:  NA  Discharge planning Services  CM Consult  Post Acute Care Choice:  NA Choice offered to:  NA  DME Arranged:  N/A DME Agency:  NA  HH Arranged:  Patient Refused HH Agency:  NA  Status of Service:  Completed, signed off  Medicare Important Message Given:  Yes Date Medicare IM Given:    Medicare IM give by:    Date Additional Medicare IM Given:    Additional Medicare Important Message give by:     If discussed at Long Length of Stay Meetings, dates discussed:    Additional Comments:  Elliot CousinShavis, Wilbur Labuda Ellen, RN 03/24/2016, 3:18 PM

## 2016-03-24 NOTE — Progress Notes (Signed)
Physical Therapy Treatment Patient Details Name: Adrienne HeirJessie B Tinnel MRN: [id number] DOB: [birth year] Today's Date: 03/24/2016    History of Present Illness 82 y.o. female with past medical history of CHF HTN, HLD, CKD, DM2, CAD, anemia, h/o L AKA admitted on 03/20/2016 with SOB and chest pain    PT Comments    Patient mobilizing with RW well using hop to technique (no prothesis here). Denies SOB, overall VS stable. Some fatigue with activity. Anticipate patient will be safe for d/c home. Will continue to follow as indicated.  Follow Up Recommendations  Home health PT;Supervision - Intermittent     Equipment Recommendations  None recommended by PT    Recommendations for Other Services       Precautions / Restrictions Precautions Precaution Comments: Left BKA Restrictions Weight Bearing Restrictions: No    Mobility  Bed Mobility Overal bed mobility: Modified Independent             General bed mobility comments: increased time to come to EOB, no physical assist required  Transfers Overall transfer level: Needs assistance Equipment used: Rolling walker (2 wheeled) Transfers: Sit to/from Stand Sit to Stand: Modified independent (Device/Increase time)            Ambulation/Gait Ambulation/Gait assistance: Supervision Ambulation Distance (Feet): 50 Feet Assistive device: Rolling walker (2 wheeled) Gait Pattern/deviations: Step-to pattern Gait velocity: decreased   General Gait Details: no physical assist, patient with some fatigue with 50 ft distance but overall tolerated well. VSS   Stairs            Wheelchair Mobility    Modified Rankin (Stroke Patients Only)       Balance Overall balance assessment: No apparent balance deficits (not formally assessed)                                  Cognition Arousal/Alertness: Awake/alert Behavior During Therapy: WFL for tasks assessed/performed Overall Cognitive Status: Within Functional  Limits for tasks assessed                      Exercises      General Comments        Pertinent Vitals/Pain Pain Assessment: No/denies pain    Home Living                      Prior Function            PT Goals (current goals can now be found in the care plan section) Acute Rehab PT Goals Patient Stated Goal: to go home PT Goal Formulation: With patient Time For Goal Achievement: 03/29/16 Potential to Achieve Goals: Good Progress towards PT goals: Progressing toward goals    Frequency  Min 3X/week    PT Plan Current plan remains appropriate    Co-evaluation             End of Session Equipment Utilized During Treatment: Gait belt;Oxygen Activity Tolerance: Patient tolerated treatment well Patient left: in chair;with call bell/phone within reach;with chair alarm set     Time: 0800-0820 PT Time Calculation (min) (ACUTE ONLY): 20 min  Charges:  $Gait Training: 8-22 mins                    G CodesFabio Asa:      Legacy Lacivita J 03/24/2016, 8:24 AM  Charlotte Crumbevon Porchea Charrier, PT DPT  [phone]

## 2016-03-24 NOTE — Discharge Instructions (Addendum)
Respiratory failure is when your lungs are not working well and your breathing (respiratory) system fails. When respiratory failure occurs, it is difficult for your lungs to get enough oxygen, get rid of carbon dioxide, or both. Respiratory failure can be life threatening.  Respiratory failure can be acute or chronic. Acute respiratory failure is sudden, severe, and requires emergency medical treatment. Chronic respiratory failure is less severe, happens over time, and requires ongoing treatment.  WHAT ARE THE CAUSES OF ACUTE RESPIRATORY FAILURE?  Any problem affecting the heart or lungs can cause acute respiratory failure. Some of these causes include the following:  Chronic bronchitis and emphysema (COPD).   Blood clot going to a lung (pulmonary embolism).   Having water in the lungs caused by heart failure, lung injury, or infection (pulmonary edema).   Collapsed lung (pneumothorax).   Pneumonia.   Pulmonary fibrosis.   Obesity.   Asthma.   Heart failure.   Any type of trauma to the chest that can make breathing difficult.   Nerve or muscle diseases making chest movements difficult. HOW WILL MY ACUTE RESPIRATORY FAILURE BE TREATED?  Treatment of acute respiratory failure depends on the cause of the respiratory failure. Usually, you will stay in the intensive care unit so your breathing can be watched closely. Treatment can include the following: 1. Oxygen. Oxygen can be delivered through the following: 1. Nasal cannula. This is small tubing that goes in your nose to give you oxygen. 2. Face mask. A face mask covers your nose and mouth to give you oxygen. 2. Medicine. Different medicines can be given to help with breathing. These can include: 1. Nebulizers. Nebulizers deliver medicines to open the air passages (bronchodilators). These medicines help to open or relax the airways in the lungs so you can breathe better. They can also help loosen mucus from your  lungs. 2. Diuretics. Diuretic medicines can help you breathe better by getting rid of extra water in your body. 3. Steroids. Steroid medicines can help decrease swelling (inflammation) in your lungs. 4. Antibiotics. 3. Chest tube. If you have a collapsed lung (pneumothorax), a chest tube is placed to help reinflate the lung. 4. Noninvasive positive pressure ventilation (NPPV). This is a tight-fitting mask that goes over your nose and mouth. The mask has tubing that is attached to a machine. The machine blows air into the tubing, which helps to keep the tiny air sacs (alveoli) in your lungs open. This machine allows you to breathe on your own. 5. Ventilator. A ventilator is a breathing machine. When on a ventilator, a breathing tube is put into the lungs. A ventilator is used when you can no longer breathe well enough on your own. You may have low oxygen levels or high carbon dioxide (CO2) levels in your blood. When you are on a ventilator, sedation and pain medicines are given to make you sleep so your lungs can heal. SEEK IMMEDIATE MEDICAL CARE IF: 1. You have shortness of breath (dyspnea) with or without activity. 2. You have rapid breathing (tachypnea). 3. You are wheezing. 4. You are unable to say more than a few words without having to catch your breath. 5. You find it very difficult to function normally. 6. You have a fast heart rate. 7. You have a bluish color to your finger or toe nail beds. 8. You have confusion or drowsiness or both.   This information is not intended to replace advice given to you by your health care provider. Make sure you discuss  any questions you have with your health care provider.   Document Released: 11/05/2013 Document Revised: 07/22/2015 Document Reviewed: 11/05/2013 Elsevier Interactive Patient Education [employer]

## 2016-03-24 NOTE — Consult Note (Signed)
   [facility] CM [facility] Consult   03/24/2016  Urbano HeirJessie B Calip [birth year] [id number]  Patient screened for potential [facility] Care Management services for chronic disease and care management for HF exacerbations.   [facility] is [facility] services under patient's Medicare  plan. Spoke with inpatient RNCM.  [facility] RNCM.  Patient will receive community follow up with their program and there are no current [facility] needs  For questions contact:   Charlesetta ShanksVictoria Dontario Evetts, RN BSN CCM [facility] Liaison  [phone] business mobile phone Toll free office [phone]

## 2016-03-24 NOTE — Discharge Summary (Addendum)
Physician Discharge Summary  Adrienne Bowman:[id number] DOB: [birth year] DOA: 03/20/2016  PCP: Ruthe Mannan, MD  Admit date: 03/20/2016 Discharge date: 03/24/2016  Recommendations for Outpatient Follow-up:  Hold lasix until you are seen by PCP who can recheck kidney function and determine if Lasix can be resumed. Creatinine is 4.07 at the time of discharge, has improved from 4.34 value.  Discharge Diagnoses:  Principal Problem:   CHF exacerbation (HCC) Active Problems:   Diabetes (HCC)   Hypertensive heart disease with CHF (congestive heart failure) (HCC)   MYOCARDIAL INFARCTION, HX OF   Congestive heart failure (HCC)   Chronic kidney disease, stage IV (severe) (HCC)   PVD (peripheral vascular disease) (HCC)   CAD (coronary artery disease)   Diabetes mellitus with nephropathy (HCC)   HLD (hyperlipidemia)   S/P AKA (above knee amputation) unilateral (HCC)   Protein-calorie malnutrition, severe (HCC)   Anemia   Acute on chronic diastolic (congestive) heart failure (HCC)   CHF (congestive heart failure) (HCC)   Palliative care encounter    Discharge Condition: stable   Diet recommendation: as tolerated   History of present illness:  82 y.o. female with medical history significant forchronic diastolic congestive heart failure, last 2-D echo March 2017 with ejection fraction 55% and grade 1 diastolic dysfunction, coronary artery disease, left AKA, hypertension, dyslipidemia. Patient presented to [facility] with reports of increasing shortness of breath receded by chest pain the night prior to this admission which woke her up from sleep. Chest pain was sharp, 10 out of 10 in intensity in midsternal area, nonradiating. Pain subsequently subsided with analgesia and nitroglycerin given in ED. Patient had some nausea but no associated vomiting.  In ED, blood pressure was 180/66, heart rate 79. Patient was afebrile and oxygen saturation was 100% on room air. Blood work was notable  for hemoglobin of 9, BNP 3876, CO2 45. Chest x-ray showed cardiac enlargement with pulmonary vascular congestion and diffuse interstitial edema. The 12-lead EKG showed sinus rhythm and no significant changes since prior EKG.  Please note, during this hospital stay we held Lasix because of worsening renal function. Prior to discharge renal function improved and recommendation is to continue to hold Lasix until creatinine close to 3.7 value on the admission  Hospital Course:    Assessment & Plan:  Acute respiratory failure likely due to acute on chronic diastolic heart failure - Secondary to acute decompensated diastolic heart failure - BNP on the admission was 3876 - Last 2-D echo in March 2017 showed ejection fraction 55% with grade 1 diastolic dysfunction - Chest x-ray showed cardiac enlargement, pulmonary vascular congestion and diffuse interstitial edema - Patient was given Lasix 60 mg IV 2 doses in ED. Then we resumed Lasix 40 mg IV every 12 hours starting 03/21/2016. Since creatinine trended up we stopped lasix 5/9.  - Weight in past 72 hours: 47.49 kg --> 46.2 kg --> 42.18 kg - As previously noted, we stopped antibiotics because there was no fevers and no elevated white blood cell count and no evidence of pneumonia on chest x-ray. - Continue aspirin daily - Continue carvedilol 25 mg twice daily - Appreciate palliative care following; continue utpatient palliative care  Follow-up - Per PT - HH PT recommended, order placed 5/10  Essential hypertension - Continue Norvasc 10 mg daily, carvedilol 25 mg twice daily, clonidine 0.3 mg weekly patch, hydralazine 100 mg 3 times daily  Type II Diabetes, controlled with renal manifestations  - A1c is 7.1 in March 2017 -  Not on any antihyperglycemic medications  Hyperlipidemia - Continue Lipitor 10 mg at bedtime  Chronic kidney disease stage V - Baseline creatinine 4.9 about one month ago - Creatinine on this admission 3.73 and further  trending up 4.01 this morning likely from lasix which we placed on hold 5/9 - Cr improving, 4.34 --> 4.07 - Recommendation to continue to hold Lasix until creatinine improves more at least closer to 3.73 value on the admission  Anemia of chronic kidney disease - Hemoglobin stable at 9 - No current indications for transfusion  S/P Left AKA - Stable    DVT prophylaxis: subQ heparin in hospital  Code Status: DNR/DNI  Family Communication: Family not at the bedside this morning; tried calling daughter over the phone this am for updates, no VM option, will try again   Consultants:   Palliative care   Physical therapy  Procedures:   None  Antimicrobials:   Vancomycin given one-time dose 03/20/2016  Zosyn 03/20/2016 --> 03/21/2016   Signed:  Manson PasseyEVINE, Aveena Bari, MD  [facility] 03/24/2016, 10:10 AM  Pager #: [phone]  Time spent in minutes: less than 30 minutes    Discharge Exam: Filed Vitals:   03/23/16 2125 03/24/16 0500  BP: 159/52 167/47  Pulse:  65  Temp: 98.6 F (37 C) 98.9 F (37.2 C)  Resp: 16    Filed Vitals:   03/23/16 0500 03/23/16 1449 03/23/16 2125 03/24/16 0500  BP: 152/48 156/41 159/52 167/47  Pulse: 75 76  65  Temp: 99 F (37.2 C) 98.6 F (37 C) 98.6 F (37 C) 98.9 F (37.2 C)  TempSrc: Oral Oral Oral Oral  Resp: 16 16 16    Height:      Weight: 42.185 kg (93 lb)   42.139 kg (92 lb 14.4 oz)  SpO2: 94% 98%  98%    General: Pt is alert, follows commands appropriately, not in acute distress Cardiovascular: Regular rate and rhythm, S1/S2 + Respiratory: Clear to auscultation bilaterally, no wheezing, no crackles, no rhonchi Abdominal: Soft, non tender, non distended, bowel sounds +, no guarding Extremities: no cyanosis,s/p AKA Neuro: Grossly nonfocal  Discharge Instructions  Discharge Instructions    Call MD for:  difficulty breathing, headache or visual disturbances    Complete by:  As directed      Call MD for:  persistant  dizziness or light-headedness    Complete by:  As directed      Call MD for:  persistant nausea and vomiting    Complete by:  As directed      Call MD for:  severe uncontrolled pain    Complete by:  As directed      Diet - low sodium heart healthy    Complete by:  As directed      Discharge instructions    Complete by:  As directed   Hold lasix until you are seen by PCP who can recheck kidney function and determine if Lasix can be resumed. Creatinine is 4.07 at the time of discharge, has improved from 4.34 value.     Increase activity slowly    Complete by:  As directed             Medication List    STOP taking these medications        furosemide 40 MG tablet  Commonly known as:  LASIX     UNABLE TO FIND      TAKE these medications        amLODipine 10 MG tablet  Commonly known as:  NORVASC  Take 1 tablet (10 mg total) by mouth daily.     aspirin EC 81 MG tablet  Take 81 mg by mouth daily.     atorvastatin 10 MG tablet  Commonly known as:  LIPITOR  Take 1 tablet (10 mg total) by mouth daily.     carvedilol 25 MG tablet  Commonly known as:  COREG  Take 1 tablet (25 mg total) by mouth 2 (two) times daily with a meal.     cloNIDine 0.3 mg/24hr patch  Commonly known as:  CATAPRES - Dosed in mg/24 hr  Place 1 patch (0.3 mg total) onto the skin once a week.     docusate sodium 100 MG capsule  Commonly known as:  COLACE  Take 1 capsule (100 mg total) by mouth daily.     famotidine 20 MG tablet  Commonly known as:  PEPCID  TAKE ONE TABLET BY MOUTH ONCE DAILY     ferrous sulfate 325 (65 FE) MG tablet  Take 1 tablet (325 mg total) by mouth daily with breakfast.     hydrALAZINE 100 MG tablet  Commonly known as:  APRESOLINE  TAKE ONE TABLET BY MOUTH THREE TIMES DAILY     multivitamin with minerals Tabs tablet  Take 1 tablet by mouth daily.     nitroGLYCERIN 0.4 MG SL tablet  Commonly known as:  NITROSTAT  Place 1 tablet (0.4 mg total) under the tongue every 5  (five) minutes as needed for chest pain.           Follow-up Information    Follow up with Ruthe Mannan, MD. Schedule an appointment as soon as possible for a visit in 1 week.   Specialty:  Family Medicine   Why:  Follow up appt after recent hospitalization   Contact information:   [address] [place] [address] [phone]        The results of significant diagnostics from this hospitalization (including imaging, microbiology, ancillary and laboratory) are listed below for reference.    Significant Diagnostic Studies: Dg Chest Port 1 View  03/21/2016  CLINICAL DATA:  CHF, hypertension, cardiac disease, previous bypass EXAM: PORTABLE CHEST 1 VIEW COMPARISON:  03/20/2016 FINDINGS: Coronary bypass changes noted. Stable cardiomegaly with slight interval improvement in the central vascular congestion and perihilar edema pattern. Minor basilar atelectasis. No enlarging effusion or pneumothorax. Aortic atherosclerosis noted. Trachea is midline. IMPRESSION: Improving CHF pattern. Electronically Signed   By: Judie Petit.  Shick M.D.   On: 03/21/2016 08:32   Dg Chest Portable 1 View  03/20/2016  CLINICAL DATA:  Generalized chest pain, nausea, and shortness of breath for unknown period of time. History of CHF, hypertension, coronary disease, and diabetes. Nonsmoker. EXAM: PORTABLE CHEST 1 VIEW COMPARISON:  01/18/2016 FINDINGS: Postoperative changes in the mediastinum. Cardiac enlargement with pulmonary vascular congestion and diffuse interstitial edema. Pattern is similar to previous study. No blunting of costophrenic angles. No pneumothorax. Mediastinal contours appear intact. Calcified and tortuous aorta. IMPRESSION: Cardiac enlargement with pulmonary vascular congestion and diffuse interstitial edema similar to previous study. Electronically Signed   By: Burman Nieves M.D.   On: 03/20/2016 06:49    Microbiology: Recent Results (from the past 240 hour(s))  Urine culture     Status: Abnormal    Collection Time: 03/20/16  8:33 AM  Result Value Ref Range Status   Specimen Description URINE, CLEAN CATCH  Final   Special Requests NONE  Final   Culture MULTIPLE SPECIES PRESENT,  SUGGEST RECOLLECTION (A)  Final   Report Status 03/21/2016 FINAL  Final  Culture, blood (x 2)     Status: None (Preliminary result)   Collection Time: 03/20/16 10:05 AM  Result Value Ref Range Status   Specimen Description RIGHT ANTECUBITAL  Final   Special Requests BOTTLES DRAWN AEROBIC AND ANAEROBIC 10CC  Final   Culture NO GROWTH 3 DAYS  Final   Report Status PENDING  Incomplete  Culture, blood (x 2)     Status: None (Preliminary result)   Collection Time: 03/20/16 10:11 AM  Result Value Ref Range Status   Specimen Description BLOOD RIGHT HAND  Final   Special Requests BOTTLES DRAWN AEROBIC AND ANAEROBIC 5CC  Final   Culture NO GROWTH 3 DAYS  Final   Report Status PENDING  Incomplete  MRSA PCR Screening     Status: None   Collection Time: 03/20/16  1:43 PM  Result Value Ref Range Status   MRSA by PCR NEGATIVE NEGATIVE Final    Comment:        The GeneXpert MRSA Assay (FDA approved for NASAL specimens only), is one component of a comprehensive MRSA colonization surveillance program. It is not intended to diagnose MRSA infection nor to guide or monitor treatment for MRSA infections.      Labs: Basic Metabolic Panel:  Recent Labs Lab 03/20/16 0610 03/22/16 0541 03/23/16 0529 03/24/16 0519  NA 140 141 141 143  K 3.8 3.6 3.5 3.9  CL 104 100* 104 104  CO2 22 24 24 25   GLUCOSE 241* 108* 100* 103*  BUN 78* 95* 98* 95*  CREATININE 3.73* 4.01* 4.34* 4.07*  CALCIUM 9.0 8.1* 8.3* 8.7*   Liver Function Tests:  Recent Labs Lab 03/20/16 0610  AST 52*  ALT 41  ALKPHOS 60  BILITOT 0.7  PROT 6.6  ALBUMIN 3.4*   No results for input(s): LIPASE, AMYLASE in the last 168 hours. No results for input(s): AMMONIA in the last 168 hours. CBC:  Recent Labs Lab 03/20/16 0610  WBC 6.4  HGB  9.0*  HCT 29.5*  MCV 89.7  PLT 170   Cardiac Enzymes: No results for input(s): CKTOTAL, CKMB, CKMBINDEX, TROPONINI in the last 168 hours. BNP: BNP (last 3 results)  Recent Labs  01/18/16 1622 03/20/16 0610 03/21/16 0533  BNP 2597.7* 3876.0* 2438.6*    ProBNP (last 3 results) No results for input(s): PROBNP in the last 8760 hours.  CBG:  Recent Labs Lab 03/23/16 0733 03/23/16 1135 03/23/16 1642 03/23/16 2129 03/24/16 0719  GLUCAP 111* 138* 158* 100* 100*

## 2016-03-25 ENCOUNTER — Telehealth: Payer: Self-pay

## 2016-03-25 LAB — CULTURE, BLOOD (ROUTINE X 2)
CULTURE: NO GROWTH
CULTURE: NO GROWTH

## 2016-03-25 NOTE — Telephone Encounter (Signed)
Attempted to reach on home phone. Voicemail is not available at this time.

## 2016-03-29 ENCOUNTER — Telehealth: Payer: Self-pay

## 2016-03-29 NOTE — Telephone Encounter (Signed)
Yes I agree with that referral.

## 2016-03-29 NOTE — Telephone Encounter (Signed)
Drenda FreezeFran with [facility] left v/m about their pallative care program called [facility]; pt recently discharged from hospital and NP at hospital recommended pt to be in pallative care services. Drenda FreezeFran request cb that Dr Dayton MartesAron is OK with pt enrolling into the home based pallative care program and that Dr Dayton MartesAron would serve as attending physician.

## 2016-03-29 NOTE — Telephone Encounter (Signed)
Spoke to hospice and provided verbal orders

## 2016-03-29 NOTE — Telephone Encounter (Signed)
Transition Care Management Follow-up Telephone Call   Date discharged? 03/24/16   How have you been since you were released from the hospital? Doing well.   Do you understand why you were in the hospital? yes   Do you understand the discharge instructions? yes   Where were you discharged to? home   Items Reviewed:  Medications reviewed: yes  Allergies reviewed: yes  Dietary changes reviewed: no  Referrals reviewed: home health - PT, palliative care   Functional Questionnaire:   Activities of Daily Living (ADLs):   She states they are independent in the following: ambulation, bathing and hygiene, feeding, continence, grooming, toileting and dressing States they require assistance with the following: none   Any transportation issues/concerns?: no   Any patient concerns? no   Confirmed importance and date/time of follow-up visits scheduled yes, 04/04/16 @ 1515  Provider Appointment booked with [name], MD  Confirmed with patient if condition begins to worsen call PCP or go to the ER.  Patient was given the office number and encouraged to call back with question or concerns.  : yes

## 2016-03-30 ENCOUNTER — Emergency Department (HOSPITAL_COMMUNITY): Payer: Medicare Other

## 2016-03-30 ENCOUNTER — Emergency Department (HOSPITAL_COMMUNITY)
Admission: EM | Admit: 2016-03-30 | Discharge: 2016-03-30 | Disposition: A | Discharge status: Home / Self Care | Payer: Medicare Other | Attending: Emergency Medicine | Admitting: Emergency Medicine

## 2016-03-30 ENCOUNTER — Encounter (HOSPITAL_COMMUNITY): Payer: Self-pay | Admitting: Family Medicine

## 2016-03-30 ENCOUNTER — Telehealth: Payer: Self-pay | Admitting: Cardiology

## 2016-03-30 DIAGNOSIS — I509 Heart failure, unspecified: Secondary | ICD-10-CM | POA: Insufficient documentation

## 2016-03-30 DIAGNOSIS — Z7982 Long term (current) use of aspirin: Secondary | ICD-10-CM | POA: Insufficient documentation

## 2016-03-30 DIAGNOSIS — Z951 Presence of aortocoronary bypass graft: Secondary | ICD-10-CM | POA: Diagnosis not present

## 2016-03-30 DIAGNOSIS — E1121 Type 2 diabetes mellitus with diabetic nephropathy: Secondary | ICD-10-CM | POA: Insufficient documentation

## 2016-03-30 DIAGNOSIS — I2510 Atherosclerotic heart disease of native coronary artery without angina pectoris: Secondary | ICD-10-CM | POA: Diagnosis not present

## 2016-03-30 DIAGNOSIS — N189 Chronic kidney disease, unspecified: Secondary | ICD-10-CM

## 2016-03-30 DIAGNOSIS — Z79899 Other long term (current) drug therapy: Secondary | ICD-10-CM | POA: Insufficient documentation

## 2016-03-30 DIAGNOSIS — I129 Hypertensive chronic kidney disease with stage 1 through stage 4 chronic kidney disease, or unspecified chronic kidney disease: Secondary | ICD-10-CM | POA: Insufficient documentation

## 2016-03-30 DIAGNOSIS — E1122 Type 2 diabetes mellitus with diabetic chronic kidney disease: Secondary | ICD-10-CM | POA: Insufficient documentation

## 2016-03-30 DIAGNOSIS — R011 Cardiac murmur, unspecified: Secondary | ICD-10-CM | POA: Insufficient documentation

## 2016-03-30 DIAGNOSIS — R0602 Shortness of breath: Secondary | ICD-10-CM | POA: Diagnosis present

## 2016-03-30 DIAGNOSIS — D649 Anemia, unspecified: Secondary | ICD-10-CM | POA: Diagnosis not present

## 2016-03-30 LAB — BASIC METABOLIC PANEL
Anion gap: 13 (ref 5–15)
BUN: 75 mg/dL — AB (ref 6–20)
CHLORIDE: 106 mmol/L (ref 101–111)
CO2: 22 mmol/L (ref 22–32)
CREATININE: 4.13 mg/dL — AB (ref 0.44–1.00)
Calcium: 8.9 mg/dL (ref 8.9–10.3)
GFR calc Af Amer: 10 mL/min — ABNORMAL LOW (ref >60)
GFR calc non Af Amer: 8 mL/min — ABNORMAL LOW (ref >60)
Glucose, Bld: 124 mg/dL — ABNORMAL HIGH (ref 65–99)
POTASSIUM: 4.5 mmol/L (ref 3.5–5.1)
SODIUM: 141 mmol/L (ref 135–145)

## 2016-03-30 LAB — BRAIN NATRIURETIC PEPTIDE: B NATRIURETIC PEPTIDE 5: 2825.9 pg/mL — AB (ref 0.0–100.0)

## 2016-03-30 LAB — CBC
HEMATOCRIT: 27.2 % — AB (ref 36.0–46.0)
Hemoglobin: 8.7 g/dL — ABNORMAL LOW (ref 12.0–15.0)
MCH: 28.5 pg (ref 26.0–34.0)
MCHC: 32 g/dL (ref 30.0–36.0)
MCV: 89.2 fL (ref 78.0–100.0)
Platelets: 207 10*3/uL (ref 150–400)
RBC: 3.05 MIL/uL — ABNORMAL LOW (ref 3.87–5.11)
RDW: 15.8 % — AB (ref 11.5–15.5)
WBC: 4.8 10*3/uL (ref 4.0–10.5)

## 2016-03-30 LAB — I-STAT TROPONIN, ED: Troponin i, poc: 0.03 ng/mL (ref 0.00–0.08)

## 2016-03-30 MED ORDER — FUROSEMIDE 20 MG PO TABS
40.0000 mg | ORAL_TABLET | Freq: Once | ORAL | Status: AC
  Administered 2016-03-30: 40 mg via ORAL
  Filled 2016-03-30: qty 2

## 2016-03-30 MED ORDER — FUROSEMIDE 40 MG PO TABS: 40.0000 mg | ORAL_TABLET | Freq: Every day | ORAL | Status: DC

## 2016-03-30 NOTE — Telephone Encounter (Signed)
Spoke with pt daughter, pt right there, stated that pt weight today is 98.4. Pt has not c/o today of any swelling but she having increased SOB over the course of today. Family stated that last time she was completely fine one day and then the next day could not breathe. Instructed to go to the hospital to be evaluated since she starting to get SOB.  Verbalized understanding, no additional questions at this time.

## 2016-03-30 NOTE — Telephone Encounter (Signed)
Pt weighed Sat 94.1 then Tuesday was 99.1 -was told to call with any weight gain pls advise [phone]

## 2016-03-30 NOTE — Telephone Encounter (Signed)
Spoke with patients granddaughter, ok per patient  Patients weight Saturday 94.1 and yesterday 99.0 Patient does not weigh daily at the same time and weighs later in the day secondary to difficulty weighing by herself Her family members are working by the time she gets up in am They call and check on her multiple times during day and she her just not first thing Fausto Skillerndvised Gail (granddaughter) that weights were more accurate first thing in am [name] denies any increased swelling or increased shortness of breath but stated she did not have any of these prior to recent admissions She had sudden onset of shortness of breath (overnight) that sent to to hospital  She does not having any at home Lasix secondary to kidney functions Follow up with Bevelyn NgoBryan H PA 04/08/16 Will forward to Dr Antoine PocheHochrein for review

## 2016-03-30 NOTE — ED Notes (Signed)
Xray states they will take pt to assigned room when they are finished with her xrays.

## 2016-03-30 NOTE — ED Provider Notes (Signed)
CSN: [id number]     Arrival date & time 03/30/16  1707 History   First MD Initiated Contact with Patient 03/30/16 1812     Chief Complaint  Patient presents with  . Shortness of Breath    Patient is a 82 y.o. female presenting with shortness of breath. The history is provided by the patient and a relative.  Shortness of Breath Severity:  Moderate Onset quality:  Gradual Duration:  1 week Timing:  Intermittent Progression:  Worsening Chronicity:  Recurrent Relieved by:  Rest Worsened by:  Activity Associated symptoms: no abdominal pain, no chest pain, no fever and no hemoptysis   Patient with h/o CAD, CHF presents with increased SOB She reports she was recently in hospital and has felt short of breath since that time No fever/cough No CP No new LE edema but there has been 4lb wt gain over past several days She is not currently on diuretics due to chronic renal failure   Past Medical History  Diagnosis Date  . CHF (congestive heart failure) (HCC) 2002    acute CHF while getting lower extremilty arteriograms /  Led to CABG  x6  . Hypertension     very difficult to control  . PVD (peripheral vascular disease) (HCC)     extensive PVD / left AKA  . Murmur, heart     soft systolic outflow murmur and a grade 2/6 murmur of the aortic insufficiency  . S/P AKA (above knee amputation) (HCC)     left AKA with prosthesis  . DIABETES MELLITUS, TYPE II   . NEPHROPATHY, DIABETIC   . Coronary artery disease 2002    CABG x6 / left internal mammary artery graft to the LAD, a saphenous vein graft to the diagonal, a sequential saphenous vein graft to the 1st and 2nd obtuse marginal branches, and a sequential saphenous vein graft the the distal right coronary artery and posterior descending  . Anemia   . CKD (chronic kidney disease)   . Advanced age    Past Surgical History  Procedure Laterality Date  . Coronary artery bypass graft  2002    x6 per Dr. Laneta Simmers  . Eye surgery  1987 & 1988     left eye  . Vascular surgery      multiple peripheral vascular surgeries  . Left aka  2006  . Right renal artery stent  2004  . Rfpbpg  2002  . Lower extremity angiogram  February 21, 2012  . Pr vein bypass graft,aorto-fem-pop  Oct. 17, 2012    Right  Fem-Distal post. Tibial artery BPG.  . Lower extremity angiogram N/A 02/21/2012    Procedure: LOWER EXTREMITY ANGIOGRAM;  Surgeon: Nada Libman, MD;  Location: [facility] C[facility] LAB;  Service: Cardiovascular;  Laterality: N/A;  . Lower extremity angiogram Right 12/25/2012    Procedure: LOWER EXTREMITY ANGIOGRAM;  Surgeon: Nada Libman, MD;  Location: [facility] CATH LAB;  Service: Cardiovascular;  Laterality: Right;  rt leg angio  . Abdominal angiogram  12/25/2012    Procedure: ABDOMINAL ANGIOGRAM;  Surgeon: Nada Libman, MD;  Location: [facility] CATH LAB;  Service: Cardiovascular;;   Family History  Problem Relation Age of Onset  . Coronary artery disease      prevalent in sibling  . Heart disease Mother   . Hypertension Daughter   . Cancer Son     Lung  and  Throat  . Heart attack Son   . Hyperlipidemia Daughter   . Hypertension Daughter   .  Hypertension Daughter   . Hypertension Daughter    Social History  Substance Use Topics  . Smoking status: Never Smoker   . Smokeless tobacco: Never Used  . Alcohol Use: No   OB History    No data available     Review of Systems  Constitutional: Positive for unexpected weight change. Negative for fever.  Respiratory: Positive for shortness of breath. Negative for hemoptysis.   Cardiovascular: Negative for chest pain and leg swelling.  Gastrointestinal: Negative for abdominal pain.  All other systems reviewed and are negative.     Allergies  Review of patient's allergies indicates no known allergies.  Home Medications   Prior to Admission medications   Medication Sig Start Date End Date Taking? Authorizing Provider  amLODipine (NORVASC) 10 MG tablet Take 1 tablet (10 mg total) by mouth daily.  03/16/16  Yes Rollene RotundaJames Hochrein, MD  aspirin EC 81 MG tablet Take 81 mg by mouth daily.   Yes Historical Provider, MD  atorvastatin (LIPITOR) 10 MG tablet Take 1 tablet (10 mg total) by mouth daily. 03/16/16  Yes Rollene RotundaJames Hochrein, MD  carvedilol (COREG) 25 MG tablet Take 1 tablet (25 mg total) by mouth 2 (two) times daily with a meal. Patient taking differently: Take 25 mg by mouth 2 (two) times daily with a meal. For CHF 08/20/15  Yes Dwana MelenaBryan W Hager, PA-C  cloNIDine (CATAPRES - DOSED IN MG/24 HR) 0.3 mg/24hr patch Place 1 patch (0.3 mg total) onto the skin once a week. 03/16/16  Yes Rollene RotundaJames Hochrein, MD  famotidine (PEPCID) 20 MG tablet TAKE ONE TABLET BY MOUTH ONCE DAILY 12/07/15  Yes Dianne Dunalia M Aron, MD  ferrous sulfate 325 (65 FE) MG tablet Take 1 tablet (325 mg total) by mouth daily with breakfast. 01/28/16  Yes Dinah C Ngetich, NP  hydrALAZINE (APRESOLINE) 100 MG tablet TAKE ONE TABLET BY MOUTH THREE TIMES DAILY 12/07/15  Yes Rollene RotundaJames Hochrein, MD  Multiple Vitamin (MULTIVITAMIN WITH MINERALS) TABS tablet Take 1 tablet by mouth daily.   Yes Historical Provider, MD  nitroGLYCERIN (NITROSTAT) 0.4 MG SL tablet Place 1 tablet (0.4 mg total) under the tongue every 5 (five) minutes as needed for chest pain. 07/13/15  Yes Renae FickleMackenzie Short, MD  docusate sodium (COLACE) 100 MG capsule Take 1 capsule (100 mg total) by mouth daily. Patient not taking: Reported on 03/30/2016 01/28/16   Dinah C Ngetich, NP   BP 167/65 mmHg  Pulse 73  Temp(Src) 98.3 F (36.8 C)  Resp 24  SpO2 100% Physical Exam CONSTITUTIONAL: Elderly, frail, no distress HEAD: Normocephalic/atraumatic EYES: EOMI ENMT: Mucous membranes moist NECK: supple no meningeal signs, +JVD SPINE/BACK:entire spine nontender CV: S1/S2 noted. No harsh murmurs noted LUNGS: brief crackles in the bases ABDOMEN: soft, nontender GU:no cva tenderness NEURO: Pt is awake/alert/appropriate, moves all extremitiesx4.  No facial droop.   EXTREMITIES: minimal edema to right LE,  prosthetic left LE SKIN: warm, color normal PSYCH: no abnormalities of mood noted, alert and oriented to situation  ED Course  Procedures 6:45 PM Pt with recent admission for CHF, diuretics held due to renal failure Pt with increased WT gain, likely early CHF She is in no distress Plan to call cardiology for recommendations 8:00 PM Pt stable D/w cardiology (PA on call) We discussed renal failure with h/o CHF Recommend one dose of oral lasix 40mg , and to take lasix 40mg  daily She already has cardiology followup arranged this month She has PCP visit scheduled for next week She has good family involvement at  home Will check weights daily Pt would like to go home Discussed strict return precautions  Labs Review Labs Reviewed  BASIC METABOLIC PANEL - Abnormal; Notable for the following:    Glucose, Bld 124 (*)    BUN 75 (*)    Creatinine, Ser 4.13 (*)    GFR calc non Af Amer 8 (*)    GFR calc Af Amer 10 (*)    All other components within normal limits  CBC - Abnormal; Notable for the following:    RBC 3.05 (*)    Hemoglobin 8.7 (*)    HCT 27.2 (*)    RDW 15.8 (*)    All other components within normal limits  BRAIN NATRIURETIC PEPTIDE - Abnormal; Notable for the following:    B Natriuretic Peptide 2825.9 (*)    All other components within normal limits  I-STAT TROPOININ, ED    Imaging Review Dg Chest 2 View  03/30/2016  CLINICAL DATA:  Shortness of breath EXAM: CHEST  2 VIEW COMPARISON:  03/21/2016 chest radiograph. FINDINGS: Sternotomy wires appear aligned and intact. CABG clips overlie the mediastinum. Stable cardiomediastinal silhouette with mild cardiomegaly. No pneumothorax. Trace left pleural effusion. No right pleural effusion. No overt pulmonary edema. Mild bibasilar scarring versus atelectasis. No acute consolidative airspace disease. IMPRESSION: 1. Stable mild cardiomegaly without overt pulmonary edema. 2. Trace left pleural effusion. 3. Mild bibasilar scarring versus  atelectasis. Electronically Signed   By: Delbert Phenix M.D.   On: 03/30/2016 18:10   I have personally reviewed and evaluated these images and lab results as part of my medical decision-making.   EKG Interpretation   Date/Time:  Wednesday Mar 30 2016 17:10:56 EDT Ventricular Rate:  71 PR Interval:  216 QRS Duration: 98 QT Interval:  414 QTC Calculation: 449 R Axis:   -28 Text Interpretation:  Sinus rhythm with 1st degree A-V block Possible Left  atrial enlargement Anteroseptal infarct , age undetermined ST \\T \ T wave  abnormality, consider inferolateral ischemia Abnormal ECG ST changes in  inferior leads worsened from prior Confirmed by [facility]  MD, Genavieve Mangiapane  [phone]) on 03/30/2016 5:33:38 PM     Medications  furosemide (LASIX) tablet 40 mg (40 mg Oral Given 03/30/16 2000)    MDM   Final diagnoses:  Acute congestive heart failure, unspecified congestive heart failure type (HCC)  Chronic renal failure, unspecified stage    Nursing notes including past medical history and social history reviewed and considered in documentation xrays/imaging reviewed by myself and considered during evaluation Labs/vital reviewed myself and considered during evaluation Previous records reviewed and considered     Zadie Rhine, MD 03/30/16 2001

## 2016-03-30 NOTE — ED Notes (Signed)
Pt st's she was in the hosp last week for CHF. Was discharged last Thursday.  Pt st's she started feeling short of breath last pm and had to sleep on 2 pillows.  Pt was receiving Lasix while in hosp but was not discharged home on any.  Pt speaking in full sentences.  No swelling noted at this time.  Pt denies any chest pain at this time.  Family at bedside.  Pt alert and oriented x's 3

## 2016-03-30 NOTE — Telephone Encounter (Signed)
Pt's granddaughter [name] is calling back to give the weight of the pt last night. She says she gained 5 lbs. Please f/u  Thanks

## 2016-03-30 NOTE — ED Notes (Signed)
Pt here for SOB that started last night. sts was sent here by her doctor. Denies pain. Denies any cough, fever, N,V.

## 2016-04-04 ENCOUNTER — Ambulatory Visit (INDEPENDENT_AMBULATORY_CARE_PROVIDER_SITE_OTHER): Payer: Medicare Other | Admitting: Family Medicine

## 2016-04-04 ENCOUNTER — Encounter (HOSPITAL_COMMUNITY): Payer: Self-pay | Admitting: Emergency Medicine

## 2016-04-04 ENCOUNTER — Emergency Department (HOSPITAL_COMMUNITY): Payer: Medicare Other

## 2016-04-04 ENCOUNTER — Inpatient Hospital Stay (HOSPITAL_COMMUNITY)
Admission: EM | Admit: 2016-04-04 | Discharge: 2016-04-07 | DRG: 291 | Disposition: A | Discharge status: Hospice | Payer: Medicare Other | Attending: Internal Medicine | Admitting: Internal Medicine

## 2016-04-04 ENCOUNTER — Encounter: Payer: Self-pay | Admitting: Family Medicine

## 2016-04-04 VITALS — BP 134/50 | HR 77 | Temp 97.6°F

## 2016-04-04 DIAGNOSIS — Z951 Presence of aortocoronary bypass graft: Secondary | ICD-10-CM

## 2016-04-04 DIAGNOSIS — D649 Anemia, unspecified: Secondary | ICD-10-CM

## 2016-04-04 DIAGNOSIS — D509 Iron deficiency anemia, unspecified: Secondary | ICD-10-CM | POA: Diagnosis present

## 2016-04-04 DIAGNOSIS — R000 Tachycardia, unspecified: Secondary | ICD-10-CM | POA: Diagnosis present

## 2016-04-04 DIAGNOSIS — D638 Anemia in other chronic diseases classified elsewhere: Secondary | ICD-10-CM

## 2016-04-04 DIAGNOSIS — R269 Unspecified abnormalities of gait and mobility: Secondary | ICD-10-CM | POA: Diagnosis present

## 2016-04-04 DIAGNOSIS — R0682 Tachypnea, not elsewhere classified: Secondary | ICD-10-CM | POA: Diagnosis present

## 2016-04-04 DIAGNOSIS — I130 Hypertensive heart and chronic kidney disease with heart failure and stage 1 through stage 4 chronic kidney disease, or unspecified chronic kidney disease: Principal | ICD-10-CM | POA: Diagnosis present

## 2016-04-04 DIAGNOSIS — Z9889 Other specified postprocedural states: Secondary | ICD-10-CM

## 2016-04-04 DIAGNOSIS — R9431 Abnormal electrocardiogram [ECG] [EKG]: Secondary | ICD-10-CM

## 2016-04-04 DIAGNOSIS — G9340 Encephalopathy, unspecified: Secondary | ICD-10-CM | POA: Diagnosis present

## 2016-04-04 DIAGNOSIS — R011 Cardiac murmur, unspecified: Secondary | ICD-10-CM | POA: Diagnosis present

## 2016-04-04 DIAGNOSIS — Z7982 Long term (current) use of aspirin: Secondary | ICD-10-CM | POA: Diagnosis not present

## 2016-04-04 DIAGNOSIS — E785 Hyperlipidemia, unspecified: Secondary | ICD-10-CM

## 2016-04-04 DIAGNOSIS — R079 Chest pain, unspecified: Secondary | ICD-10-CM

## 2016-04-04 DIAGNOSIS — E860 Dehydration: Secondary | ICD-10-CM | POA: Diagnosis present

## 2016-04-04 DIAGNOSIS — E1122 Type 2 diabetes mellitus with diabetic chronic kidney disease: Secondary | ICD-10-CM | POA: Diagnosis present

## 2016-04-04 DIAGNOSIS — Z515 Encounter for palliative care: Secondary | ICD-10-CM | POA: Diagnosis present

## 2016-04-04 DIAGNOSIS — Z7189 Other specified counseling: Secondary | ICD-10-CM | POA: Insufficient documentation

## 2016-04-04 DIAGNOSIS — I5033 Acute on chronic diastolic (congestive) heart failure: Secondary | ICD-10-CM | POA: Diagnosis present

## 2016-04-04 DIAGNOSIS — T501X5A Adverse effect of loop [high-ceiling] diuretics, initial encounter: Secondary | ICD-10-CM | POA: Diagnosis present

## 2016-04-04 DIAGNOSIS — E43 Unspecified severe protein-calorie malnutrition: Secondary | ICD-10-CM | POA: Diagnosis present

## 2016-04-04 DIAGNOSIS — Z8249 Family history of ischemic heart disease and other diseases of the circulatory system: Secondary | ICD-10-CM | POA: Diagnosis not present

## 2016-04-04 DIAGNOSIS — Z7409 Other reduced mobility: Secondary | ICD-10-CM | POA: Diagnosis present

## 2016-04-04 DIAGNOSIS — I248 Other forms of acute ischemic heart disease: Secondary | ICD-10-CM | POA: Diagnosis present

## 2016-04-04 DIAGNOSIS — Z89612 Acquired absence of left leg above knee: Secondary | ICD-10-CM

## 2016-04-04 DIAGNOSIS — D631 Anemia in chronic kidney disease: Secondary | ICD-10-CM | POA: Diagnosis present

## 2016-04-04 DIAGNOSIS — I739 Peripheral vascular disease, unspecified: Secondary | ICD-10-CM | POA: Diagnosis present

## 2016-04-04 DIAGNOSIS — N184 Chronic kidney disease, stage 4 (severe): Secondary | ICD-10-CM | POA: Diagnosis present

## 2016-04-04 DIAGNOSIS — Z79899 Other long term (current) drug therapy: Secondary | ICD-10-CM

## 2016-04-04 DIAGNOSIS — N179 Acute kidney failure, unspecified: Secondary | ICD-10-CM | POA: Diagnosis present

## 2016-04-04 DIAGNOSIS — N189 Chronic kidney disease, unspecified: Secondary | ICD-10-CM

## 2016-04-04 DIAGNOSIS — Z66 Do not resuscitate: Secondary | ICD-10-CM | POA: Diagnosis present

## 2016-04-04 DIAGNOSIS — Z89619 Acquired absence of unspecified leg above knee: Secondary | ICD-10-CM

## 2016-04-04 DIAGNOSIS — I2510 Atherosclerotic heart disease of native coronary artery without angina pectoris: Secondary | ICD-10-CM | POA: Diagnosis present

## 2016-04-04 HISTORY — DX: Reserved for inherently not codable concepts without codable children: IMO0001

## 2016-04-04 LAB — I-STAT CHEM 8, ED
BUN: 100 mg/dL — AB (ref 6–20)
CHLORIDE: 106 mmol/L (ref 101–111)
Calcium, Ion: 1.14 mmol/L (ref 1.13–1.30)
Creatinine, Ser: 5.2 mg/dL — ABNORMAL HIGH (ref 0.44–1.00)
Glucose, Bld: 182 mg/dL — ABNORMAL HIGH (ref 65–99)
HEMATOCRIT: 29 % — AB (ref 36.0–46.0)
Hemoglobin: 9.9 g/dL — ABNORMAL LOW (ref 12.0–15.0)
Potassium: 4.5 mmol/L (ref 3.5–5.1)
SODIUM: 142 mmol/L (ref 135–145)
TCO2: 24 mmol/L (ref 0–100)

## 2016-04-04 LAB — COMPREHENSIVE METABOLIC PANEL
ALBUMIN: 3.4 g/dL — AB (ref 3.5–5.0)
ALT: 56 U/L — ABNORMAL HIGH (ref 14–54)
ANION GAP: 8 (ref 5–15)
AST: 60 U/L — ABNORMAL HIGH (ref 15–41)
Alkaline Phosphatase: 67 U/L (ref 38–126)
BUN: 91 mg/dL — ABNORMAL HIGH (ref 6–20)
CHLORIDE: 108 mmol/L (ref 101–111)
CO2: 23 mmol/L (ref 22–32)
Calcium: 8.7 mg/dL — ABNORMAL LOW (ref 8.9–10.3)
Creatinine, Ser: 4.86 mg/dL — ABNORMAL HIGH (ref 0.44–1.00)
GFR calc non Af Amer: 7 mL/min — ABNORMAL LOW (ref >60)
GFR, EST AFRICAN AMERICAN: 8 mL/min — AB (ref >60)
GLUCOSE: 188 mg/dL — AB (ref 65–99)
POTASSIUM: 4.4 mmol/L (ref 3.5–5.1)
SODIUM: 139 mmol/L (ref 135–145)
Total Bilirubin: 0.6 mg/dL (ref 0.3–1.2)
Total Protein: 6.7 g/dL (ref 6.5–8.1)

## 2016-04-04 LAB — CBC WITH DIFFERENTIAL/PLATELET
BASOS ABS: 0 10*3/uL (ref 0.0–0.1)
Basophils Relative: 0 %
Eosinophils Absolute: 0.1 10*3/uL (ref 0.0–0.7)
Eosinophils Relative: 1 %
HEMATOCRIT: 27.5 % — AB (ref 36.0–46.0)
Hemoglobin: 8.5 g/dL — ABNORMAL LOW (ref 12.0–15.0)
LYMPHS PCT: 17 %
Lymphs Abs: 1.2 10*3/uL (ref 0.7–4.0)
MCH: 27.2 pg (ref 26.0–34.0)
MCHC: 30.9 g/dL (ref 30.0–36.0)
MCV: 88.1 fL (ref 78.0–100.0)
MONO ABS: 0.5 10*3/uL (ref 0.1–1.0)
Monocytes Relative: 7 %
NEUTROS ABS: 5.3 10*3/uL (ref 1.7–7.7)
Neutrophils Relative %: 75 %
PLATELETS: 226 10*3/uL (ref 150–400)
RBC: 3.12 MIL/uL — AB (ref 3.87–5.11)
RDW: 16.4 % — ABNORMAL HIGH (ref 11.5–15.5)
WBC: 7.1 10*3/uL (ref 4.0–10.5)

## 2016-04-04 LAB — TROPONIN I: Troponin I: 0.07 ng/mL — ABNORMAL HIGH (ref <0.031)

## 2016-04-04 LAB — I-STAT TROPONIN, ED: Troponin i, poc: 0.05 ng/mL (ref 0.00–0.08)

## 2016-04-04 LAB — PROTIME-INR
INR: 1.22 (ref 0.00–1.49)
Prothrombin Time: 15.6 seconds — ABNORMAL HIGH (ref 11.6–15.2)

## 2016-04-04 LAB — TSH: TSH: 6.562 u[IU]/mL — AB (ref 0.350–4.500)

## 2016-04-04 LAB — BRAIN NATRIURETIC PEPTIDE: B NATRIURETIC PEPTIDE 5: 4227.2 pg/mL — AB (ref 0.0–100.0)

## 2016-04-04 MED ORDER — AMLODIPINE BESYLATE 10 MG PO TABS
10.0000 mg | ORAL_TABLET | Freq: Every day | ORAL | Status: DC
Start: 2016-04-05 — End: 2016-04-07
  Administered 2016-04-05 – 2016-04-07 (×3): 10 mg via ORAL
  Filled 2016-04-04 (×3): qty 1

## 2016-04-04 MED ORDER — ALBUTEROL SULFATE (2.5 MG/3ML) 0.083% IN NEBU
2.5000 mg | INHALATION_SOLUTION | Freq: Four times a day (QID) | RESPIRATORY_TRACT | Status: DC | PRN
  Administered 2016-04-07: 2.5 mg via RESPIRATORY_TRACT
  Filled 2016-04-04 (×3): qty 3

## 2016-04-04 MED ORDER — ASPIRIN EC 81 MG PO TBEC
81.0000 mg | DELAYED_RELEASE_TABLET | Freq: Every day | ORAL | Status: DC
  Administered 2016-04-05 – 2016-04-07 (×3): 81 mg via ORAL
  Filled 2016-04-04 (×3): qty 1

## 2016-04-04 MED ORDER — ONDANSETRON HCL 4 MG PO TABS: 4.0000 mg | ORAL_TABLET | Freq: Four times a day (QID) | ORAL | Status: DC | PRN

## 2016-04-04 MED ORDER — MORPHINE SULFATE (PF) 2 MG/ML IV SOLN
1.0000 mg | INTRAVENOUS | Status: DC | PRN
  Administered 2016-04-07: 1 mg via INTRAVENOUS
  Filled 2016-04-04: qty 1

## 2016-04-04 MED ORDER — ASPIRIN 81 MG PO CHEW
324.0000 mg | CHEWABLE_TABLET | Freq: Once | ORAL | Status: AC
  Administered 2016-04-04: 324 mg via ORAL
  Filled 2016-04-04: qty 4

## 2016-04-04 MED ORDER — SODIUM CHLORIDE 0.9% FLUSH
3.0000 mL | Freq: Two times a day (BID) | INTRAVENOUS | Status: DC
  Administered 2016-04-05 – 2016-04-07 (×4): 3 mL via INTRAVENOUS

## 2016-04-04 MED ORDER — LORAZEPAM 2 MG/ML IJ SOLN: 1.0000 mg | Freq: Four times a day (QID) | INTRAMUSCULAR | Status: DC | PRN

## 2016-04-04 MED ORDER — ACETAMINOPHEN 325 MG PO TABS: 650.0000 mg | ORAL_TABLET | Freq: Four times a day (QID) | ORAL | Status: DC | PRN

## 2016-04-04 MED ORDER — MORPHINE SULFATE (PF) 4 MG/ML IV SOLN
4.0000 mg | Freq: Once | INTRAVENOUS | Status: AC
  Administered 2016-04-04: 4 mg via INTRAVENOUS
  Filled 2016-04-04: qty 1

## 2016-04-04 MED ORDER — HEPARIN BOLUS VIA INFUSION
2500.0000 [IU] | Freq: Once | INTRAVENOUS | Status: DC
  Filled 2016-04-04: qty 2500

## 2016-04-04 MED ORDER — HEPARIN SODIUM (PORCINE) 5000 UNIT/ML IJ SOLN: 2500.0000 [IU] | Freq: Once | INTRAMUSCULAR | Status: DC

## 2016-04-04 MED ORDER — ACETAMINOPHEN 650 MG RE SUPP: 650.0000 mg | Freq: Four times a day (QID) | RECTAL | Status: DC | PRN

## 2016-04-04 MED ORDER — FUROSEMIDE 10 MG/ML IJ SOLN
40.0000 mg | Freq: Once | INTRAMUSCULAR | Status: DC
Start: 2016-04-04 — End: 2016-04-04

## 2016-04-04 MED ORDER — ATORVASTATIN CALCIUM 10 MG PO TABS
10.0000 mg | ORAL_TABLET | Freq: Every day | ORAL | Status: DC
  Administered 2016-04-05 – 2016-04-07 (×3): 10 mg via ORAL
  Filled 2016-04-04 (×3): qty 1

## 2016-04-04 MED ORDER — HEPARIN (PORCINE) IN NACL 100-0.45 UNIT/ML-% IJ SOLN
600.0000 [IU]/h | INTRAMUSCULAR | Status: DC
  Filled 2016-04-04: qty 250

## 2016-04-04 MED ORDER — LORAZEPAM 2 MG/ML IJ SOLN
1.0000 mg | Freq: Once | INTRAMUSCULAR | Status: AC
Start: 2016-04-04 — End: 2016-04-04
  Administered 2016-04-04: 1 mg via INTRAVENOUS
  Filled 2016-04-04: qty 1

## 2016-04-04 MED ORDER — ONDANSETRON HCL 4 MG/2ML IJ SOLN: 4.0000 mg | Freq: Four times a day (QID) | INTRAMUSCULAR | Status: DC | PRN

## 2016-04-04 MED ORDER — FUROSEMIDE 10 MG/ML IJ SOLN
40.0000 mg | Freq: Once | INTRAMUSCULAR | Status: AC
  Administered 2016-04-04: 40 mg via INTRAVENOUS
  Filled 2016-04-04: qty 4

## 2016-04-04 MED ORDER — HYDRALAZINE HCL 50 MG PO TABS
100.0000 mg | ORAL_TABLET | Freq: Three times a day (TID) | ORAL | Status: DC
Start: 2016-04-05 — End: 2016-04-07
  Administered 2016-04-05 – 2016-04-07 (×8): 100 mg via ORAL
  Filled 2016-04-04 (×8): qty 2

## 2016-04-04 MED ORDER — SODIUM CHLORIDE 0.9 % IV SOLN
INTRAVENOUS | Status: DC
  Administered 2016-04-04: via INTRAVENOUS

## 2016-04-04 MED ORDER — CARVEDILOL 25 MG PO TABS: 25.0000 mg | ORAL_TABLET | Freq: Two times a day (BID) | ORAL | Status: DC

## 2016-04-04 MED ORDER — NITROGLYCERIN 0.4 MG SL SUBL: 0.4000 mg | SUBLINGUAL_TABLET | SUBLINGUAL | Status: DC | PRN

## 2016-04-04 MED ORDER — HEPARIN SODIUM (PORCINE) 5000 UNIT/ML IJ SOLN
60.0000 [IU]/kg | Freq: Once | INTRAMUSCULAR | Status: DC
Start: 2016-04-04 — End: 2016-04-04

## 2016-04-04 NOTE — ED Notes (Signed)
Attempted report 

## 2016-04-04 NOTE — Progress Notes (Deleted)
Subjective:   Patient ID: Adrienne Bowman, female    DOB: 1917/12/11, 82 y.o.   MRN: [id number]  Adrienne Bowman is a pleasant 82 y.o. year old female who presents to clinic today with No chief complaint on file.  on 04/04/2016  HPI:  ER follow up Was seen in ER on 03/30/16 for SOB and weight gain- notes reviewed for acute on chronic diastolic heart failure.  Notes reviewed.  Cardiology consulted- advised increased dose of lasix which had been held due to worsening renal function.  Has follow up scheduled with cardiology on 04/08/16.  Current Outpatient Prescriptions on File Prior to Visit  Medication Sig Dispense Refill  . amLODipine (NORVASC) 10 MG tablet Take 1 tablet (10 mg total) by mouth daily. 30 tablet 3  . aspirin EC 81 MG tablet Take 81 mg by mouth daily.    [name] atorvastatin (LIPITOR) 10 MG tablet Take 1 tablet (10 mg total) by mouth daily. 30 tablet 3  . carvedilol (COREG) 25 MG tablet Take 1 tablet (25 mg total) by mouth 2 (two) times daily with a meal. (Patient taking differently: Take 25 mg by mouth 2 (two) times daily with a meal. For CHF) 180 tablet 3  . cloNIDine (CATAPRES - DOSED IN MG/24 HR) 0.3 mg/24hr patch Place 1 patch (0.3 mg total) onto the skin once a week. 7 patch 0  . famotidine (PEPCID) 20 MG tablet TAKE ONE TABLET BY MOUTH ONCE DAILY 30 tablet 5  . ferrous sulfate 325 (65 FE) MG tablet Take 1 tablet (325 mg total) by mouth daily with breakfast. 30 tablet 3  . furosemide (LASIX) 40 MG tablet Take 1 tablet (40 mg total) by mouth daily. 7 tablet 0  . hydrALAZINE (APRESOLINE) 100 MG tablet TAKE ONE TABLET BY MOUTH THREE TIMES DAILY 90 tablet 6  . Multiple Vitamin (MULTIVITAMIN WITH MINERALS) TABS tablet Take 1 tablet by mouth daily.    . nitroGLYCERIN (NITROSTAT) 0.4 MG SL tablet Place 1 tablet (0.4 mg total) under the tongue every 5 (five) minutes as needed for chest pain. 30 tablet 0   No current facility-administered medications on file prior to visit.    No  Known Allergies  Past Medical History  Diagnosis Date  . CHF (congestive heart failure) (HCC) 2002    acute CHF while getting lower extremilty arteriograms /  Led to CABG  x6  . Hypertension     very difficult to control  . PVD (peripheral vascular disease) (HCC)     extensive PVD / left AKA  . Murmur, heart     soft systolic outflow murmur and a grade 2/6 murmur of the aortic insufficiency  . S/P AKA (above knee amputation) (HCC)     left AKA with prosthesis  . DIABETES MELLITUS, TYPE II   . NEPHROPATHY, DIABETIC   . Coronary artery disease 2002    CABG x6 / left internal mammary artery graft to the LAD, a saphenous vein graft to the diagonal, a sequential saphenous vein graft to the 1st and 2nd obtuse marginal branches, and a sequential saphenous vein graft the the distal right coronary artery and posterior descending  . Anemia   . CKD (chronic kidney disease)   . Advanced age     Past Surgical History  Procedure Laterality Date  . Coronary artery bypass graft  2002    x6 per Dr. Laneta Simmers  . Eye surgery  1987 & 1988     left eye  . Vascular  surgery      multiple peripheral vascular surgeries  . Left aka  2006  . Right renal artery stent  2004  . Rfpbpg  2002  . Lower extremity angiogram  February 21, 2012  . Pr vein bypass graft,aorto-fem-pop  Oct. 17, 2012    Right  Fem-Distal post. Tibial artery BPG.  . Lower extremity angiogram N/A 02/21/2012    Procedure: LOWER EXTREMITY ANGIOGRAM;  Surgeon: Nada LibmanVance W Brabham, MD;  [facility];  Service: Cardiovascular;  Laterality: N/A;  . Lower extremity angiogram Right 12/25/2012    Procedure: LOWER EXTREMITY ANGIOGRAM;  Surgeon: Nada LibmanVance W Brabham, MD;  [facility];  Service: Cardiovascular;  Laterality: Right;  rt leg angio  . Abdominal angiogram  12/25/2012    Procedure: ABDOMINAL ANGIOGRAM;  Surgeon: Nada LibmanVance W Brabham, MD;  [facility];  Service: Cardiovascular;;    Family History  Problem Relation Age of Onset    . Coronary artery disease      prevalent in sibling  . Heart disease Mother   . Hypertension Daughter   . Cancer Son     Lung  and  Throat  . Heart attack Son   . Hyperlipidemia Daughter   . Hypertension Daughter   . Hypertension Daughter   . Hypertension Daughter     Social History   Social History  . Marital Status: Widowed    Spouse Name: N/A  . Number of Children: N/A  . Years of Education: N/A   Occupational History  . Not on file.   Social History Main Topics  . Smoking status: Never Smoker   . Smokeless tobacco: Never Used  . Alcohol Use: No  . Drug Use: No  . Sexual Activity: No   Other Topics Concern  . Not on file   Social History Narrative   The PMH, PSH, Social History, Family History, Medications, and allergies [facility], and have been updated if relevant.   Review of Systems  Constitutional: Negative for fever and fatigue.  HENT: Negative.   Respiratory: Negative.   Cardiovascular: Negative.   Neurological: Negative.   All other systems reviewed and are negative.      Objective:    There were no vitals taken for this visit.  Wt Readings from Last 3 Encounters:  03/24/16 92 lb 14.4 oz (42.139 kg)  03/14/16 105 lb 1.9 oz (47.682 kg)  02/09/16 89 lb 14.4 oz (40.778 kg)    Physical Exam   Constitutional: She is oriented to person, place, and time. No distress.  HENT:  Head: Normocephalic.  Eyes: Conjunctivae are normal.  Neck: Normal range of motion.  Cardiovascular: Normal rate.  Pulmonary/Chest: Effort normal.  Musculoskeletal:  Normal tone and bulk of RLE, no LEE. Left leg AKA  Neurological: She is alert and oriented to person, place, and time.  Skin: Skin is warm and dry. She is not diaphoretic.  Psychiatric: She has a normal mood and affect. Her behavior is normal. Thought content normal.  Nursing note and vitals reviewed.     Assessment & Plan:   No diagnosis found. No Follow-up on file.

## 2016-04-04 NOTE — ED Notes (Signed)
Attempted report x 2 

## 2016-04-04 NOTE — ED Notes (Signed)
Pt experiencing SOB starting at 500 this morning. Pt also reports chest pressure. Pt alert. Due to EKG changes pt being roomed now.

## 2016-04-04 NOTE — Progress Notes (Signed)
appt cancelled- sent to ER.  Working to breath.

## 2016-04-04 NOTE — Progress Notes (Signed)
Pre visit review using our clinic review tool, if applicable. No additional management support is needed unless otherwise documented below in the visit note. 

## 2016-04-04 NOTE — H&P (Signed)
History and Physical    Adrienne Bowman WUJ:[id number] DOB: [birth year] DOA: 04/04/2016  Referring MD/NP/PA:   PCP: Ruthe Mannan, MD   Outpatient Specialists: Megan Mans, resident   Patient coming from: home   Chief Complaint: shortness of breath, chest pressure  HPI: Adrienne Bowman is a 82 y.o. female with medical history significant for hypertension, dyslipidemia, chronic diastolic CHF, last 2 D ECHO in 01/2016 with grade 1 DD, preserved EF, CAD s/p CABG, left AKA who presented to [facility] from home with initial complaint of left sided chest pressure started on the day of the admission and per family lasted for 10 hours, constant, non radiating, at rest associated with shortness of breath. Pain was 6/10 in intensity and not relieved with aspirin or nitroglycerin at home. No reports of palpitations. Please note pt is currently sleeping, hard to arouse, she was just given morphine but her daughters have provided majority of the history. No reports of loss of consciousness. No reports of falls. No reports of cough or fevers. No reports of abdominal pain, nausea or vomiting. No blood in stool or urine. Her family also reports she has not been eating well for past few days PTA.   ED Course: BP 126/53, HR 82, RR 18-32. T max was 97.9 F, oxygen saturation 95% on [place] oxygen support. CXR showed minimal interstitial edema. Blood work showed Hgb 8.5, creatinine 4.85 (Cr 4.17 on 03/30/2016). The 12 lead EKG was done 3 time with variable findings but overall ST segment depressions in inferior, septal leads. She is DNR and per family, no aggressive measure but they want to try fluids and see if she feels better.  Review of Systems:  Unable to obtain from pt due to her altered mental status   Past Medical History  Diagnosis Date  . CHF (congestive heart failure) (HCC) 2002    acute CHF while getting lower extremilty arteriograms /  Led to CABG  x6  . Hypertension     very difficult to control  . PVD  (peripheral vascular disease) (HCC)     extensive PVD / left AKA  . Murmur, heart     soft systolic outflow murmur and a grade 2/6 murmur of the aortic insufficiency  . S/P AKA (above knee amputation) (HCC)     left AKA with prosthesis  . DIABETES MELLITUS, TYPE II   . NEPHROPATHY, DIABETIC   . Coronary artery disease 2002    CABG x6 / left internal mammary artery graft to the LAD, a saphenous vein graft to the diagonal, a sequential saphenous vein graft to the 1st and 2nd obtuse marginal branches, and a sequential saphenous vein graft the the distal right coronary artery and posterior descending  . Anemia   . CKD (chronic kidney disease)   . Advanced age     Past Surgical History  Procedure Laterality Date  . Coronary artery bypass graft  2002    x6 per Dr. Laneta Simmers  . Eye surgery  1987 & 1988     left eye  . Vascular surgery      multiple peripheral vascular surgeries  . Left aka  2006  . Right renal artery stent  2004  . Rfpbpg  2002  . Lower extremity angiogram  February 21, 2012  . Pr vein bypass graft,aorto-fem-pop  Oct. 17, 2012    Right  Fem-Distal post. Tibial artery BPG.  . Lower extremity angiogram N/A 02/21/2012    Procedure: LOWER EXTREMITY ANGIOGRAM;  Surgeon: Faylene Million  Janae Bridgeman, MD;  Location: [facility] CATH LAB;  Service: Cardiovascular;  Laterality: N/A;  . Lower extremity angiogram Right 12/25/2012    Procedure: LOWER EXTREMITY ANGIOGRAM;  Surgeon: Nada Libman, MD;  Location: [facility] CATH LAB;  Service: Cardiovascular;  Laterality: Right;  rt leg angio  . Abdominal angiogram  12/25/2012    Procedure: ABDOMINAL ANGIOGRAM;  Surgeon: Nada Libman, MD;  Location: [facility] CATH LAB;  Service: Cardiovascular;;    Social history:  Unable to obtain due to altered mental status   Ambulation: at baseline able to get up with assistance per her daughters at the bedside   No Known Allergies  Family History  Problem Relation Age of Onset  . Coronary artery disease      prevalent in sibling    . Heart disease Mother   . Hypertension Daughter   . Cancer Son     Lung  and  Throat  . Heart attack Son   . Hyperlipidemia Daughter   . Hypertension Daughter   . Hypertension Daughter   . Hypertension Daughter     Prior to Admission medications   Medication Sig Start Date End Date Taking? Authorizing Provider  amLODipine (NORVASC) 10 MG tablet Take 1 tablet (10 mg total) by mouth daily. 03/16/16  Yes Rollene Rotunda, MD  aspirin EC 81 MG tablet Take 81 mg by mouth daily.   Yes Historical Provider, MD  atorvastatin (LIPITOR) 10 MG tablet Take 1 tablet (10 mg total) by mouth daily. 03/16/16  Yes Rollene Rotunda, MD  carvedilol (COREG) 25 MG tablet Take 1 tablet (25 mg total) by mouth 2 (two) times daily with a meal. Patient taking differently: Take 25 mg by mouth 2 (two) times daily with a meal. For CHF 08/20/15  Yes Dwana Melena, PA-C  cloNIDine (CATAPRES - DOSED IN MG/24 HR) 0.3 mg/24hr patch Place 1 patch (0.3 mg total) onto the skin once a week. 03/16/16  Yes Rollene Rotunda, MD  famotidine (PEPCID) 20 MG tablet TAKE ONE TABLET BY MOUTH ONCE DAILY 12/07/15  Yes Dianne Dun, MD  furosemide (LASIX) 40 MG tablet Take 1 tablet (40 mg total) by mouth daily. 03/30/16  Yes Zadie Rhine, MD  hydrALAZINE (APRESOLINE) 100 MG tablet TAKE ONE TABLET BY MOUTH THREE TIMES DAILY 12/07/15  Yes Rollene Rotunda, MD  Multiple Vitamin (MULTIVITAMIN WITH MINERALS) TABS tablet Take 1 tablet by mouth daily.   Yes Historical Provider, MD  nitroGLYCERIN (NITROSTAT) 0.4 MG SL tablet Place 1 tablet (0.4 mg total) under the tongue every 5 (five) minutes as needed for chest pain. 07/13/15  Yes Renae Fickle, MD    Physical Exam: Filed Vitals:   04/04/16 1800 04/04/16 1845 04/04/16 1900 04/04/16 1945  BP: 136/43 126/53 136/55 149/57  Pulse: 25 69 70 68  Temp:      TempSrc:      Resp: 19 19 19 18   SpO2: 99% 100% 100% 99%   Filed Vitals:   04/04/16 1800 04/04/16 1845 04/04/16 1900 04/04/16 1945  BP: 136/43  126/53 136/55 149/57  Pulse: 25 69 70 68  Temp:      TempSrc:      Resp: 19 19 19 18   SpO2: 99% 100% 100% 99%   Constitutional: NAD, calm, comfortable, sleeping Eyes: PERRL, lids and conjunctivae normal ENMT: Mucous membranes are dry. No exudates  Neck: normal, supple, no masses, no thyromegaly Respiratory: diminished with no rhonchi or wheezing  Cardiovascular: Regular rate and rhythm, murmur appreciated / no rubs /  gallops. Palpable pulses  Abdomen: no tenderness, no masses palpated. No hepatosplenomegaly. Bowel sounds positive.  Musculoskeletal: no clubbing / cyanosis. Left AKA Skin: no rashes, lesions, ulcers. No induration Neurologic: Unable to test due to pt sleeping (she just got morphine and does not respond to verbal stimuli) Psychiatric: Unable to assess due to her mental status at the present     Labs on Admission: I have personally reviewed following labs and imaging studies  CBC:  Recent Labs Lab 03/30/16 1743 04/04/16 1626 04/04/16 1637  WBC 4.8 7.1  --   NEUTROABS  --  5.3  --   HGB 8.7* 8.5* 9.9*  HCT 27.2* 27.5* 29.0*  MCV 89.2 88.1  --   PLT 207 226  --    Basic Metabolic Panel:  Recent Labs Lab 03/30/16 1743 04/04/16 1626 04/04/16 1637  NA 141 139 142  K 4.5 4.4 4.5  CL 106 108 106  CO2 22 23  --   GLUCOSE 124* 188* 182*  BUN 75* 91* 100*  CREATININE 4.13* 4.86* 5.20*  CALCIUM 8.9 8.7*  --    GFR: Estimated Creatinine Clearance: 4.3 mL/min (by C-G formula based on Cr of 5.2). Liver Function Tests:  Recent Labs Lab 04/04/16 1626  AST 60*  ALT 56*  ALKPHOS 67  BILITOT 0.6  PROT 6.7  ALBUMIN 3.4*   No results for input(s): LIPASE, AMYLASE in the last 168 hours. No results for input(s): AMMONIA in the last 168 hours. Coagulation Profile:  Recent Labs Lab 04/04/16 1626  INR 1.22   Cardiac Enzymes: No results for input(s): CKTOTAL, CKMB, CKMBINDEX, TROPONINI in the last 168 hours. BNP (last 3 results) No results for input(s):  PROBNP in the last 8760 hours. HbA1C: No results for input(s): HGBA1C in the last 72 hours. CBG: No results for input(s): GLUCAP in the last 168 hours. Lipid Profile: No results for input(s): CHOL, HDL, LDLCALC, TRIG, CHOLHDL, LDLDIRECT in the last 72 hours. Thyroid Function Tests: No results for input(s): TSH, T4TOTAL, FREET4, T3FREE, THYROIDAB in the last 72 hours. Anemia Panel: No results for input(s): VITAMINB12, FOLATE, FERRITIN, TIBC, IRON, RETICCTPCT in the last 72 hours. Urine analysis:    Component Value Date/Time   COLORURINE YELLOW 03/20/2016 0833   APPEARANCEUR CLEAR 03/20/2016 0833   LABSPEC 1.013 03/20/2016 0833   PHURINE 5.5 03/20/2016 0833   GLUCOSEU NEGATIVE 03/20/2016 0833   HGBUR NEGATIVE 03/20/2016 0833   BILIRUBINUR NEGATIVE 03/20/2016 0833   KETONESUR NEGATIVE 03/20/2016 0833   PROTEINUR 100* 03/20/2016 0833   NITRITE NEGATIVE 03/20/2016 0833   LEUKOCYTESUR NEGATIVE 03/20/2016 0833   Sepsis Labs: @LABRCNTIP (procalcitonin:4,lacticidven:4) )No results found for this or any previous visit (from the past 240 hour(s)).   Radiological Exams on Admission: Dg Chest Port 1 View 04/04/2016 Mild prominence of the perihilar and bibasilar markings suggesting minimal interstitial edema and less likely infection. Borderline stable cardiomegaly. Electronically Signed   By: Elberta Fortisaniel  Boyle M.D.   On: 04/04/2016 16:31    EKG: Independently reviewed. Diffuse T wave inversions, otherwise sinus rhythm, 1 st degree AV block  Assessment/Plan  Principal Problem:   Acute encephalopathy - Unclear etiology, possibly worsening renal failure, dehydration - Will continue to monitor mental status - No aggressive work up per family  Active Problems:   Chest pain - Will cycle cardiac enzymes but per family, no aggressive work up - She has 2 D ECHO in 01/2016 with diastolic dysfunction grade 1 and preserved EF - Continue daily aspirin - She is not  complaining chest pain this am     Acute on chronic diastolic (congestive) heart failure (HCC) / CAD (coronary artery disease) / S/P CABG (coronary artery bypass graft) - As mentioned, 2 D ECHO in 01/2016 with grade 1 DD and preserved EF - She cant get lasix as her renal function severely diminished and now in stage 5 renal failure - Palliative consulted for goals of care     Impaired mobility - PT eval in am if pt feels better     S/P AKA (above knee amputation) unilateral (HCC) - Stable     Hyperlipidemia - Resume statin therapy     Protein-calorie malnutrition, severe (HCC) - In the context of chronic illness - Nutrition consulted     Acute renal failure superimposed on stage 4 chronic kidney disease (HCC) - Now in stage 5 renal failure - No fluids due to concern for fluid overload and interstitial edema on CXR however she look dehydrated  - Will monitor - PCT for GOC    Anemia of chronic disease - Due to CKD  - Hgb 8.5    DVT prophylaxis: SCD's bilaterally  Code Status: DNR/DNI Family Communication: daughters at the bedside  Disposition Plan: admission to telemetry due to tachycardia, and tachypnea  Consults called: Palliative care Admission status: Inpatient    Manson Passey MD [facility] Pager [phone]  If 7PM-7AM, please contact night-coverage [email or web address] Password [email or web address]  04/04/2016, 8:20 PM

## 2016-04-04 NOTE — ED Provider Notes (Signed)
CSN: [id number]     Arrival date & time 04/04/16  1541 History   First MD Initiated Contact with Patient 04/04/16 1604     Chief Complaint  Patient presents with  . Shortness of Breath  . Chest Pain    Patient is a 82 y.o. female presenting with chest pain. The history is provided by the patient and medical records.  Chest Pain Pain location:  L chest Pain quality: pressure   Pain radiates to:  Does not radiate Pain severity:  Moderate Onset quality:  Gradual Duration:  10 hours (onset 4AM) Timing:  Constant Progression:  Worsening Chronicity:  Recurrent Context: at rest   Relieved by:  Nothing Associated symptoms: lower extremity edema (unchanged) and shortness of breath   Associated symptoms: no abdominal pain, no altered mental status, no anorexia, no back pain, no cough, no diaphoresis, no fever, no headache, no nausea, no near-syncope, no orthopnea, no syncope, not vomiting and no weakness   Associated symptoms comment:  No weight gain. No hemoptysis. No sputum, no cough.  No bleeding.  No trauma Risk factors: coronary artery disease (CABG 2002), diabetes mellitus and hypertension   Risk factors: not female  Aortic disease: L AKA,, vasculopath.    Risk factors comment:  CHF, CKD   Past Medical History  Diagnosis Date  . CHF (congestive heart failure) (HCC) 2002    acute CHF while getting lower extremilty arteriograms /  Led to CABG  x6  . Hypertension     very difficult to control  . PVD (peripheral vascular disease) (HCC)     extensive PVD / left AKA  . Murmur, heart     soft systolic outflow murmur and a grade 2/6 murmur of the aortic insufficiency  . S/P AKA (above knee amputation) (HCC)     left AKA with prosthesis  . DIABETES MELLITUS, TYPE II   . NEPHROPATHY, DIABETIC   . Coronary artery disease 2002    CABG x6 / left internal mammary artery graft to the LAD, a saphenous vein graft to the diagonal, a sequential saphenous vein graft to the 1st and 2nd obtuse  marginal branches, and a sequential saphenous vein graft the the distal right coronary artery and posterior descending  . Anemia   . CKD (chronic kidney disease)   . Advanced age    Past Surgical History  Procedure Laterality Date  . Coronary artery bypass graft  2002    x6 per Dr. Laneta SimmersBartle  . Eye surgery  1987 & 1988     left eye  . Vascular surgery      multiple peripheral vascular surgeries  . Left aka  2006  . Right renal artery stent  2004  . Rfpbpg  2002  . Lower extremity angiogram  February 21, 2012  . Pr vein bypass graft,aorto-fem-pop  Oct. 17, 2012    Right  Fem-Distal post. Tibial artery BPG.  . Lower extremity angiogram N/A 02/21/2012    Procedure: LOWER EXTREMITY ANGIOGRAM;  Surgeon: Nada LibmanVance W Brabham, MD;  [facility];  Service: Cardiovascular;  Laterality: N/A;  . Lower extremity angiogram Right 12/25/2012    Procedure: LOWER EXTREMITY ANGIOGRAM;  Surgeon: Nada LibmanVance W Brabham, MD;  [facility];  Service: Cardiovascular;  Laterality: Right;  rt leg angio  . Abdominal angiogram  12/25/2012    Procedure: ABDOMINAL ANGIOGRAM;  Surgeon: Nada LibmanVance W Brabham, MD;  [facility];  Service: Cardiovascular;;   Family History  Problem Relation Age of Onset  .  Coronary artery disease      prevalent in sibling  . Heart disease Mother   . Hypertension Daughter   . Cancer Son     Lung  and  Throat  . Heart attack Son   . Hyperlipidemia Daughter   . Hypertension Daughter   . Hypertension Daughter   . Hypertension Daughter    Social History  Substance Use Topics  . Smoking status: Never Smoker   . Smokeless tobacco: Never Used  . Alcohol Use: No   OB History    No data available     Review of Systems  Constitutional: Negative for fever, chills and diaphoresis.  HENT: Negative for congestion.   Eyes: Negative for visual disturbance.  Respiratory: Positive for shortness of breath. Negative for cough and wheezing.   Cardiovascular: Positive for chest pain  and leg swelling (unchanged). Negative for orthopnea, syncope and near-syncope.  Gastrointestinal: Negative for nausea, vomiting, abdominal pain and anorexia.  Genitourinary: Negative for dysuria and difficulty urinating.  Musculoskeletal: Negative for back pain.  Skin: Negative for rash.  Neurological: Negative for syncope, weakness and headaches.  Psychiatric/Behavioral: Negative for confusion.  All other systems reviewed and are negative.  Allergies  Review of patient's allergies indicates no known allergies.  Home Medications   Prior to Admission medications   Medication Sig Start Date End Date Taking? Authorizing Provider  amLODipine (NORVASC) 10 MG tablet Take 1 tablet (10 mg total) by mouth daily. 03/16/16  Yes Rollene Rotunda, MD  aspirin EC 81 MG tablet Take 81 mg by mouth daily.   Yes Historical Provider, MD  atorvastatin (LIPITOR) 10 MG tablet Take 1 tablet (10 mg total) by mouth daily. 03/16/16  Yes Rollene Rotunda, MD  carvedilol (COREG) 25 MG tablet Take 1 tablet (25 mg total) by mouth 2 (two) times daily with a meal. Patient taking differently: Take 25 mg by mouth 2 (two) times daily with a meal. For CHF 08/20/15  Yes Dwana Melena, PA-C  cloNIDine (CATAPRES - DOSED IN MG/24 HR) 0.3 mg/24hr patch Place 1 patch (0.3 mg total) onto the skin once a week. 03/16/16  Yes Rollene Rotunda, MD  famotidine (PEPCID) 20 MG tablet TAKE ONE TABLET BY MOUTH ONCE DAILY 12/07/15  Yes Dianne Dun, MD  furosemide (LASIX) 40 MG tablet Take 1 tablet (40 mg total) by mouth daily. 03/30/16  Yes Zadie Rhine, MD  hydrALAZINE (APRESOLINE) 100 MG tablet TAKE ONE TABLET BY MOUTH THREE TIMES DAILY 12/07/15  Yes Rollene Rotunda, MD  Multiple Vitamin (MULTIVITAMIN WITH MINERALS) TABS tablet Take 1 tablet by mouth daily.   Yes Historical Provider, MD  nitroGLYCERIN (NITROSTAT) 0.4 MG SL tablet Place 1 tablet (0.4 mg total) under the tongue every 5 (five) minutes as needed for chest pain. 07/13/15  Yes Renae Fickle, MD   BP 153/57 mmHg  Pulse 72  Temp(Src) 97.5 F (36.4 C) (Oral)  Resp 18  Ht  (1.575 m)  Wt 47.356 kg  BMI 19.09 kg/m2  SpO2 100% Physical Exam  Constitutional: She is oriented to person, place, and time.  Elderly, thin, frail, tachypneic, anxious  HENT:  Head: Normocephalic and atraumatic.  Nose: Nose normal.  Mouth/Throat: No oropharyngeal exudate.  No oral lesions  Eyes: Conjunctivae are normal.  No conj pallor  Neck: Normal range of motion. Neck supple. JVD (3cm) present. No tracheal deviation present.  Cardiovascular: Normal rate, regular rhythm and normal heart sounds.   No murmur heard. Pulmonary/Chest: Effort normal and breath sounds  normal. No respiratory distress. She has no rales.  Tachypneic, speaking in long phrases  Abdominal: Soft. Bowel sounds are normal. She exhibits no distension and no mass. There is no tenderness.  Musculoskeletal: Normal range of motion. She exhibits no edema.  1+ edema to right leg, left leg prosthesis  Neurological: She is alert and oriented to person, place, and time.  Skin: Skin is warm and dry. No rash noted. No pallor.  Psychiatric: She has a normal mood and affect.  Nursing note and vitals reviewed.   ED Course  Procedures (including critical care time) Labs Review Labs Reviewed  CBC WITH DIFFERENTIAL/PLATELET - Abnormal; Notable for the following:    RBC 3.12 (*)    Hemoglobin 8.5 (*)    HCT 27.5 (*)    RDW 16.4 (*)    All other components within normal limits  COMPREHENSIVE METABOLIC PANEL - Abnormal; Notable for the following:    Glucose, Bld 188 (*)    BUN 91 (*)    Creatinine, Ser 4.86 (*)    Calcium 8.7 (*)    Albumin 3.4 (*)    AST 60 (*)    ALT 56 (*)    GFR calc non Af Amer 7 (*)    GFR calc Af Amer 8 (*)    All other components within normal limits  BRAIN NATRIURETIC PEPTIDE - Abnormal; Notable for the following:    B Natriuretic Peptide 4227.2 (*)    All other components within normal limits   PROTIME-INR - Abnormal; Notable for the following:    Prothrombin Time 15.6 (*)    All other components within normal limits  TSH - Abnormal; Notable for the following:    TSH 6.562 (*)    All other components within normal limits  TROPONIN I - Abnormal; Notable for the following:    Troponin I 0.07 (*)    All other components within normal limits  I-STAT CHEM 8, ED - Abnormal; Notable for the following:    BUN 100 (*)    Creatinine, Ser 5.20 (*)    Glucose, Bld 182 (*)    Hemoglobin 9.9 (*)    HCT 29.0 (*)    All other components within normal limits  COMPREHENSIVE METABOLIC PANEL  CBC  TROPONIN I  TROPONIN I  Rosezena Sensor, ED    Imaging Review Dg Chest Port 1 View  04/04/2016  CLINICAL DATA:  Severe shortness of breath and chest pain. EXAM: PORTABLE CHEST 1 VIEW COMPARISON:  03/30/2016 FINDINGS: Sternotomy wires are unchanged. Lungs are adequately inflated with mild prominence of the perihilar and bibasilar markings likely mild interstitial edema. The no definite pleural effusion. No pneumothorax. Borderline cardiomegaly. Moderate calcified over the thoracoabdominal aorta. Mild degenerate changes of the spine. IMPRESSION: Mild prominence of the perihilar and bibasilar markings suggesting minimal interstitial edema and less likely infection. Borderline stable cardiomegaly. Electronically Signed   By: Elberta Fortis M.D.   On: 04/04/2016 16:31   I have personally reviewed and evaluated these images and lab results as part of my medical decision-making.   EKG Interpretation   Date/Time:  Monday Apr 04 2016 16:11:54 EDT Ventricular Rate:  85 PR Interval:  220 QRS Duration: 115 QT Interval:  376 QTC Calculation: 447 R Axis:   -25 Text Interpretation:  Sinus rhythm Ventricular premature complex  Borderline prolonged PR interval LVH with secondary repolarization  abnormality Lateral infarct, acute Probable anterior infarct, age  indeterminate No significant change since last  tracing Confirmed by [facility]  MD, DAVID ([id number]) on 04/04/2016 4:59:39 PM      MDM   Final diagnoses:  Chest pain, unspecified chest pain type  CKD (chronic kidney disease), unspecified stage  EKG abnormalities  Anemia, unspecified anemia type    Tachypneic, anxious, +CP.  ASA given.  Holding heparin as Hgb trending down, discussion with family and pt would not want very aggressive measures.  Will delta trop and re-assess hep need.  Initial trop not positive.    BP stable, not hypoxic.  Considered PE given tachypnea and CP, sCr would not tolerate contrast load.   CXR wo PTX or PNA.  Palliative care consulted for pt previously  4:17 PM cardiology called, concern for STE in AVR, STE depressions in lateral leads.  Activating cath lab is not recommended.    BNP elevated, +pulm edema.  Kidney function worsening, cardiorenal or from too much lasix? Will dose x1 and reassess as inpt repeat doses, repeat labs.    5:55 PM pain better controlled, resp rate better.   Admitted to hospitalist for further medical management.  Will need to re-address goals of care.   Trend trop.      Sofie Rower, MD 04/04/16 2256  Richardean Canal, MD 04/06/16 [phone]

## 2016-04-04 NOTE — ED Notes (Signed)
MD at bedside.IV attempted x 2 without success.

## 2016-04-05 ENCOUNTER — Encounter (HOSPITAL_COMMUNITY): Payer: Self-pay | Admitting: General Practice

## 2016-04-05 DIAGNOSIS — Z7189 Other specified counseling: Secondary | ICD-10-CM | POA: Insufficient documentation

## 2016-04-05 DIAGNOSIS — D509 Iron deficiency anemia, unspecified: Secondary | ICD-10-CM | POA: Insufficient documentation

## 2016-04-05 DIAGNOSIS — Z515 Encounter for palliative care: Secondary | ICD-10-CM | POA: Insufficient documentation

## 2016-04-05 DIAGNOSIS — Z951 Presence of aortocoronary bypass graft: Secondary | ICD-10-CM

## 2016-04-05 LAB — COMPREHENSIVE METABOLIC PANEL
ALT: 47 U/L (ref 14–54)
ANION GAP: 10 (ref 5–15)
AST: 43 U/L — ABNORMAL HIGH (ref 15–41)
Albumin: 3.2 g/dL — ABNORMAL LOW (ref 3.5–5.0)
Alkaline Phosphatase: 61 U/L (ref 38–126)
BUN: 95 mg/dL — ABNORMAL HIGH (ref 6–20)
CHLORIDE: 109 mmol/L (ref 101–111)
CO2: 23 mmol/L (ref 22–32)
Calcium: 8.7 mg/dL — ABNORMAL LOW (ref 8.9–10.3)
Creatinine, Ser: 2.69 mg/dL — ABNORMAL HIGH (ref 0.44–1.00)
GFR, EST AFRICAN AMERICAN: 16 mL/min — AB (ref >60)
GFR, EST NON AFRICAN AMERICAN: 14 mL/min — AB (ref >60)
Glucose, Bld: 104 mg/dL — ABNORMAL HIGH (ref 65–99)
Potassium: 4.3 mmol/L (ref 3.5–5.1)
SODIUM: 142 mmol/L (ref 135–145)
Total Bilirubin: 0.6 mg/dL (ref 0.3–1.2)
Total Protein: 6 g/dL — ABNORMAL LOW (ref 6.5–8.1)

## 2016-04-05 LAB — TROPONIN I
Troponin I: 0.11 ng/mL — ABNORMAL HIGH (ref <0.031)
Troponin I: 0.11 ng/mL — ABNORMAL HIGH (ref <0.031)

## 2016-04-05 LAB — CBC
HCT: 23.9 % — ABNORMAL LOW (ref 36.0–46.0)
HEMOGLOBIN: 7.5 g/dL — AB (ref 12.0–15.0)
MCH: 27.7 pg (ref 26.0–34.0)
MCHC: 31.4 g/dL (ref 30.0–36.0)
MCV: 88.2 fL (ref 78.0–100.0)
PLATELETS: 183 10*3/uL (ref 150–400)
RBC: 2.71 MIL/uL — AB (ref 3.87–5.11)
RDW: 16.5 % — ABNORMAL HIGH (ref 11.5–15.5)
WBC: 5.2 10*3/uL (ref 4.0–10.5)

## 2016-04-05 LAB — GLUCOSE, CAPILLARY: GLUCOSE-CAPILLARY: 92 mg/dL (ref 65–99)

## 2016-04-05 MED ORDER — ACETAMINOPHEN 325 MG PO TABS: 650.0000 mg | ORAL_TABLET | Freq: Four times a day (QID) | ORAL | Status: AC | PRN

## 2016-04-05 MED ORDER — OXYCODONE HCL 5 MG PO TABS
2.5000 mg | ORAL_TABLET | ORAL | Status: DC | PRN
  Administered 2016-04-05: 2.5 mg via ORAL
  Filled 2016-04-05: qty 1

## 2016-04-05 MED ORDER — BOOST / RESOURCE BREEZE PO LIQD
1.0000 | Freq: Three times a day (TID) | ORAL | Status: DC
  Administered 2016-04-05 – 2016-04-07 (×4): 1 via ORAL

## 2016-04-05 MED ORDER — SODIUM CHLORIDE 0.9 % IV SOLN
INTRAVENOUS | Status: DC
  Administered 2016-04-05: 12:00:00 via INTRAVENOUS

## 2016-04-05 MED ORDER — CARVEDILOL 25 MG PO TABS
25.0000 mg | ORAL_TABLET | Freq: Two times a day (BID) | ORAL | Status: DC
  Administered 2016-04-06 – 2016-04-07 (×4): 25 mg via ORAL
  Filled 2016-04-05 (×4): qty 1

## 2016-04-05 MED ORDER — SODIUM CHLORIDE 0.9 % IV SOLN: INTRAVENOUS | Status: DC

## 2016-04-05 MED ORDER — ALBUTEROL SULFATE (2.5 MG/3ML) 0.083% IN NEBU: 2.5000 mg | INHALATION_SOLUTION | Freq: Four times a day (QID) | RESPIRATORY_TRACT | Status: AC | PRN

## 2016-04-05 MED ORDER — CARVEDILOL 25 MG PO TABS
25.0000 mg | ORAL_TABLET | Freq: Two times a day (BID) | ORAL | Status: DC
  Administered 2016-04-05 (×2): 25 mg via ORAL
  Filled 2016-04-05 (×2): qty 1

## 2016-04-05 NOTE — Evaluation (Signed)
Physical Therapy Evaluation Patient Details Name: Adrienne Bowman MRN: [id number] DOB: 06-Mar-1918 Today's Date: 04/05/2016   History of Present Illness  82 y.o. female with past medical history of CHF, Anemia, CKD 4, PVD s/p AKA, CAD s/p CABG, who was admitted in March and then again earlier in May of this year with CHF and chest pain. She also had a visit to the ER for the same recently. She was admitted on 04/04/2016 with chest pain, and encephalopathy.   Clinical Impression  Pt admitted with above diagnosis. Pt currently with functional limitations due to the deficits listed below (see PT Problem List). Ms. Lamontagne politely declined ambulation today but perform transfer to chair w/ supervision w/o RW.  Pt and family have planned meeting w/ Palliative Care to discuss plan moving forward.  Pt living independently PTA w/ family visiting several times each week. Pt will benefit from skilled PT to increase their independence and safety with mobility to allow discharge to the venue listed below.      Follow Up Recommendations Home health PT;Supervision - Intermittent    Equipment Recommendations  None recommended by PT    Recommendations for Other Services       Precautions / Restrictions Precautions Precaution Comments: Left BKA Restrictions Weight Bearing Restrictions: No      Mobility  Bed Mobility Overal bed mobility: Modified Independent             General bed mobility comments: increased time to come to EOB w/ HOB elevated, no physical assist required  Transfers Overall transfer level: Needs assistance Equipment used: None Transfers: Sit to/from [employer] Transfers Sit to Stand: Supervision   Squat pivot transfers: Supervision     General transfer comment: Supervision for safety.  Pt stand and reaches for armrest of chair and pivots.  Ambulation/Gait             General Gait Details: Pt politely declined ambulating at this time, "I just want to  rest"  Stairs            Wheelchair Mobility    Modified Rankin (Stroke Patients Only)       Balance Overall balance assessment: Needs assistance Sitting-balance support: Feet supported;No upper extremity supported Sitting balance-Leahy Scale: Good     Standing balance support: Single extremity supported;During functional activity Standing balance-Leahy Scale: Fair                               Pertinent Vitals/Pain Pain Assessment: No/denies pain    Home Living Family/patient expects to be discharged to:: Unsure (see general notes below and most recent palliative note) Living Arrangements: Alone Available Help at Discharge: Family;Available PRN/intermittently Type of Home: House Home Access: Ramped entrance     Home Layout: One level Home Equipment: Walker - 2 wheels;Walker - 4 wheels;Bedside commode;Grab bars - toilet;Grab bars - tub/shower Additional Comments: Pt's daughter and grandaughter visit pt several times each week    Prior Function Level of Independence: Independent with assistive device(s)         Comments: Pt reports she uses her RW for short distance ambulation in home and has a WC that she can self-propel for farther distances.  Her daughter drives her.  She says she is independent w/ taking a bath and dressing.     Hand Dominance        Extremity/Trunk Assessment   Upper Extremity Assessment: Overall WFL for tasks assessed  Lower Extremity Assessment:  (Lt BKA)         Communication   Communication: No difficulties  Cognition Arousal/Alertness: Awake/alert Behavior During Therapy: WFL for tasks assessed/performed Overall Cognitive Status: Within Functional Limits for tasks assessed                      General Comments General comments (skin integrity, edema, etc.): During Palliative Care visit pt expressed interest [facility] and [facility] for [facility]. Palliative  Care has a planned meeting w/ pt and family to make this decision.    Exercises        Assessment/Plan    PT Assessment Patient needs continued PT services  PT Diagnosis Difficulty walking   PT Problem List Decreased activity tolerance;Decreased balance;Cardiopulmonary status limiting activity  PT Treatment Interventions DME instruction;Gait training;Functional mobility training;Therapeutic activities;Therapeutic exercise;Balance training;Patient/family education   PT Goals (Current goals can be found in the Care Plan section) Acute Rehab PT Goals Patient Stated Goal: none stated PT Goal Formulation: With patient Time For Goal Achievement: 04/19/16 Potential to Achieve Goals: Good    Frequency Min 3X/week   Barriers to discharge        Co-evaluation               End of Session Equipment Utilized During Treatment: Oxygen Activity Tolerance: Patient tolerated treatment well Patient left: in chair;with call bell/phone within reach;with chair alarm set;with family/visitor present Nurse Communication: Mobility status         Time: [id number] PT Time Calculation (min) (ACUTE ONLY): 19 min   Charges:   PT Evaluation $PT Eval Low Complexity: 1 Procedure     PT G Codes:       Encarnacion ChuAshley Abashian PT, DPT  Pager: [phone] Phone: [phone] 04/05/2016, 4:30 PM

## 2016-04-05 NOTE — Progress Notes (Addendum)
Initial Nutrition Assessment  DOCUMENTATION CODES:   Severe malnutrition in context of chronic illness  INTERVENTION:  -Boost Breeze po TID, each supplement provides 250 kcal and 9 grams of protein -RD continue to monitor for needs -Encourage PO intake  NUTRITION DIAGNOSIS:   Malnutrition related to chronic illness as evidenced by severe depletion of body fat, severe depletion of muscle mass.  GOAL:   Patient will meet greater than or equal to 90% of their needs  MONITOR:   PO intake, I & O's, Supplement acceptance, Labs, Skin  REASON FOR ASSESSMENT:   Malnutrition Screening Tool, Consult Assessment of nutrition requirement/status  ASSESSMENT:   Adrienne Bowman is a 82 y.o. female with medical history significant for hypertension, dyslipidemia, chronic diastolic CHF, last 2 D ECHO in 01/2016 with grade 1 DD, preserved EF, CAD s/p CABG, left AKA who presented to [facility] from home with initial complaint of left sided chest pressure started on the day of the admission and per family lasted for 10 hours, constant, non radiating, at rest associated with shortness of breath  Spoke with Adrienne Bowman, daughter at bedside.  On previous admit, pt received Mighty Shakes and [employer] as intervention. Pt was not a huge fan of might shakes, but did want to do boost breeze again. Pt exhibits an 11#/10.4% severe wt loss in 3 weeks upon admit.  When I asked her and her daughter about this, patient was afraid to drink fluid because of risk for aspiration. Pt presented with some dehydration, renal insufficiency so it is likely no surprise that her weight was down significantly.  Daughter denied poor PO intake - stating that patient has never been a 'big eater' but she does eat 2-3 meals regularly at home with some meat and lots of vegetables.  Encouraged pt to eat as much as she could as this would help her go home.  Nutrition-Focused physical exam completed. Findings are severe fat depletion,  severe muscle depletion, and no edema.   Labs and Medications reviewed: BUN 95, Cr 2.69, EGFR 16;   Diet Order:  Diet regular Room service appropriate?: Yes; Fluid consistency:: Thin Diet - low sodium heart healthy  Skin:  Wound (see comment) (L AKA)  Last BM:  5/9  Height:   Ht Readings from Last 1 Encounters:  04/04/16 '5\' 2"'$  (1.575 m)    Weight:   Wt Readings from Last 1 Encounters:  04/05/16 94 lb 6.4 oz (42.82 kg)    Ideal Body Weight:  46 kg  BMI:  Body mass index is 17.26 kg/(m^2).  Estimated Nutritional Needs:   Kcal:  1200-1400 calories (~25-30 cal/kg)  Protein:  40-50 grams  Fluid:  >/= 1.2L  EDUCATION NEEDS:   No education needs identified at this time   Adrienne Anis. Jeanean Hollett, MS, RD LDN Inpatient Clinical Dietitian Pager [phone]

## 2016-04-05 NOTE — Progress Notes (Signed)
Hgb down to 7.5 this AM from 9.9 last night. MD paged. Will continue to monitor.

## 2016-04-05 NOTE — Progress Notes (Signed)
Patient ID: Adrienne Bowman, female   DOB: [birth year], 82 y.o.   MRN: [id number]  PROGRESS NOTE    Adrienne HeirJessie B Labrada  WUJ:[id number] DOB: [birth year] DOA: 04/04/2016  PCP: [name], MD   Brief Narrative:  82 y.o. female with medical history significant for hypertension, dyslipidemia, chronic diastolic CHF, last 2 D ECHO in 01/2016 with grade 1 DD, preserved EF, CAD s/p CABG, left AKA who [facility] with initial complaint of left sided chest pressure started on the day of the admission and per family lasted for 10 hours, constant, non radiating, at rest associated with shortness of breath. Pain was 6/10 in intensity and not relieved with aspirin or nitroglycerin at home.  In ED, patient was hemodynamically stable. Blood work showed Hgb 8.5, creatinine 4.85 (Cr 4.17 on 03/30/2016). The 12 lead EKG was done 3 time with variable findings but overall ST segment depressions in inferior, septal leads. She is DNR and per family, no aggressive measures.   Assessment & Plan:   Principal Problem:  Acute encephalopathy - Llikely dehydration, renal insufficiency - Mental status improving this morning, she is more alert but still not fully oriented - Continue to monitor  Active Problems:  Chest pain / mild troponin elevation - Troponin level 0.07, 0.11, 0.11, likely reflective of demand ischemia in the setting of worsening renal failure - She has 2 D ECHO in 01/2016 with diastolic dysfunction grade 1 and preserved EF - Continue daily aspirin - Patient does have ST segment depression in inferior leads and one of the EKGs and another EKG with T-wave inversions - Her family does not want aggressive workup - She has no complaints of chest pain   Acute on chronic diastolic (congestive) heart failure (HCC) / CAD (coronary artery disease) / S/P CABG (coronary artery bypass graft) - 2 D ECHO in 01/2016 with grade 1 DD and preserved EF - Because of worsening renal function we cannot start  Lasix. Her creatinine has significantly improved in past 24 hours with holding Lasix. - Appreciate palliative care seeing the patient in consultation   Impaired mobility - Obtain physical therapy evaluation   S/P AKA (above knee amputation) unilateral (HCC) - Stable    Hyperlipidemia - Continue statin therapy   Protein-calorie malnutrition, severe (HCC) - In the context of chronic illness - Nutrition consulted    Acute renal failure superimposed on stage 4 chronic kidney disease (HCC) - Now in stage 5 renal failure - Will give low rate IV fluids, normal saline at 50 mL an hour for next 6 hours which is primarily to see if kidney function improves even further - Her creatinine was as high as 5.20 04/04/2016 and this morning is 2.69 - Follow-up BMP tomorrow morning   Anemia of chronic disease - Due to CKD  - Hgb down to 7.5 this morning. Transfuse for hemoglobin less than 7   DVT prophylaxis: SCDs bilaterally, aspirin Code Status: DNR/DNI  Family Communication: Family not at the bedside this morning Disposition Plan: Anticipate discharge by 04/06/2016 depending on ET evaluation and palliative care evaluation   Consultants:   Palliative care  Physical therapy  Procedures:  None  Antimicrobials:   (None   Subjective: No overnight events.  Objective: Filed Vitals:   04/04/16 2035 04/04/16 2334 04/05/16 0057 04/05/16 0316  BP: 153/57  138/60 158/54  Pulse: 72  74 77  Temp: 97.5 F (36.4 C)  97.7 F (36.5 C) 98.2 F (36.8 C)  TempSrc: Oral  Oral  Oral  Resp: Height:  (1.575 m)     Weight: 47.356 kg (104 lb 6.4 oz) 44.634 kg (98 lb 6.4 oz)  42.82 kg (94 lb 6.4 oz)  SpO2: 100%  100% 100%    Intake/Output Summary (Last 24 hours) at 04/05/16 0924 Last data filed at 04/05/16 1610  Gross per 24 hour  Intake    240 ml  Output      0 ml  Net    240 ml   Filed Weights   04/04/16 2035 04/04/16 2334 04/05/16 0316  Weight: 47.356 kg (104  lb 6.4 oz) 44.634 kg (98 lb 6.4 oz) 42.82 kg (94 lb 6.4 oz)    Examination:  General exam: Appears calm and comfortable  Respiratory system: Clear to auscultation. Respiratory effort normal. Cardiovascular system: S1 & S2 heard, RRR.  Gastrointestinal system: Abdomen is nondistended, soft and nontender. No organomegaly or masses felt. Normal bowel sounds heard. Central nervous system: Alert and oriented. No focal neurological deficits. Extremities: left aka, RLE palpable pulses  Skin: No rashes, lesions or ulcers Psychiatry: Normal mood and behavior, not agitated or restless   Data Reviewed: I have personally reviewed following labs and imaging studies  CBC:  Recent Labs Lab 03/30/16 1743 04/04/16 1626 04/04/16 1637 04/05/16 0244  WBC 4.8 7.1  --  5.2  NEUTROABS  --  5.3  --   --   HGB 8.7* 8.5* 9.9* 7.5*  HCT 27.2* 27.5* 29.0* 23.9*  MCV 89.2 88.1  --  88.2  PLT 207 226  --  183   Basic Metabolic Panel:  Recent Labs Lab 03/30/16 1743 04/04/16 1626 04/04/16 1637 04/05/16 0244  NA 141 139 142 142  K 4.5 4.4 4.5 4.3  CL 106 108 106 109  CO2 22 23  --  23  GLUCOSE 124* 188* 182* 104*  BUN 75* 91* 100* 95*  CREATININE 4.13* 4.86* 5.20* 2.69*  CALCIUM 8.9 8.7*  --  8.7*   GFR: Estimated Creatinine Clearance: 8.5 mL/min (by C-G formula based on Cr of 2.69). Liver Function Tests:  Recent Labs Lab 04/04/16 1626 04/05/16 0244  AST 60* 43*  ALT 56* 47  ALKPHOS 67 61  BILITOT 0.6 0.6  PROT 6.7 6.0*  ALBUMIN 3.4* 3.2*   No results for input(s): LIPASE, AMYLASE in the last 168 hours. No results for input(s): AMMONIA in the last 168 hours. Coagulation Profile:  Recent Labs Lab 04/04/16 1626  INR 1.22   Cardiac Enzymes:  Recent Labs Lab 04/04/16 2109 04/05/16 0244 04/05/16 0824  TROPONINI 0.07* 0.11* 0.11*   BNP (last 3 results) No results for input(s): PROBNP in the last 8760 hours. HbA1C: No results for input(s): HGBA1C in the last 72  hours. CBG:  Recent Labs Lab 04/05/16 0721  GLUCAP 92   Lipid Profile: No results for input(s): CHOL, HDL, LDLCALC, TRIG, CHOLHDL, LDLDIRECT in the last 72 hours. Thyroid Function Tests:  Recent Labs  04/04/16 2059  TSH 6.562*   Anemia Panel: No results for input(s): VITAMINB12, FOLATE, FERRITIN, TIBC, IRON, RETICCTPCT in the last 72 hours. Urine analysis:    Component Value Date/Time   COLORURINE YELLOW 03/20/2016 0833   APPEARANCEUR CLEAR 03/20/2016 0833   LABSPEC 1.013 03/20/2016 0833   PHURINE 5.5 03/20/2016 0833   GLUCOSEU NEGATIVE 03/20/2016 0833   HGBUR NEGATIVE 03/20/2016 0833   BILIRUBINUR NEGATIVE 03/20/2016 0833   KETONESUR NEGATIVE 03/20/2016 0833   PROTEINUR 100* 03/20/2016 0833   NITRITE NEGATIVE  03/20/2016 0833   LEUKOCYTESUR NEGATIVE 03/20/2016 0833   Sepsis Labs: @LABRCNTIP (procalcitonin:4,lacticidven:4)   )No results found for this or any previous visit (from the past 240 hour(s)).    Radiology Studies: Dg Chest Port 1 View 04/04/2016  Mild prominence of the perihilar and bibasilar markings suggesting minimal interstitial edema and less likely infection. Borderline stable cardiomegaly. Electronically Signed   By: Elberta Fortis M.D.   On: 04/04/2016 16:31     Scheduled Meds: . amLODipine  10 mg Oral Daily  . aspirin EC  81 mg Oral Daily  . atorvastatin  10 mg Oral Daily  . carvedilol  25 mg Oral BID WC  . hydrALAZINE  100 mg Oral TID  . sodium chloride flush  3 mL Intravenous Q12H   Continuous Infusions: . sodium chloride 10 mL/hr at 04/04/16 2330  . sodium chloride       LOS: 1 day    Time spent: 25 minutes  Greater than 50% of the time spent on counseling and coordinating the care.   Manson Passey, MD [facility] Pager [phone]  If 7PM-7AM, please contact night-coverage [email or web address] Password [facility] 04/05/2016, 9:24 AM

## 2016-04-05 NOTE — Discharge Instructions (Signed)

## 2016-04-05 NOTE — Progress Notes (Signed)
Pt very lethargic and a poor historian. Patient has been added to the Admissions Nurse list for the AM when family will return.

## 2016-04-05 NOTE — Consult Note (Signed)
Consultation Note Date: 04/05/2016   Patient Name: Adrienne Bowman  DOB: [birth year]  MRN: [id number]  Age / Sex: 82 y.o., female  PCP: Adrienne Passy, Bowman Referring Physician: Robbie Lis, Bow[facility]  Reason for Consultation: Establishing goals of care and Adrienne Bowman Evaluation  HPI/Patient Profile: 82 y.o. female  with past medical history of CHF, Anemia, CKD 4, PVD s/p AKA, CAD s/p CABG, who was admitted in March and then again earlier in May of this year with CHF and chest pain.  She also had a visit to the ER for the same recently.  She was admitted on 04/04/2016 with chest pain, and encephalopathy her BNP was 4227.    This morning her encephalopathy appears resolved.  Her hgb dropped from 9.9 to 7.5 overnight likely due to dilution.  Her creatinine has dropped from 5.2 to 2.69 - baseline appears to be about 4.3.   She recalls being very SOB yesterday and being taken to her primary care physician by her daughter.  Her PCP directed her to the ER.  Despite her advanced age and AKA, Adrienne Bowman lives independently.  Her daughter lives across the yard.  She is cared for by her daughters and grand daughter who visit her each day.  Clinical Assessment and Goals of Care: I met with the patient and her family in March 2017.  Her goals of care were very clear.  DNR/DNI natural death.  She is interested in Adrienne Bowman and possibly Adrienne Bowman for end of life.    Adrienne Bowman tells me she is still very functional and enjoying life.  She would welcome Adrienne Bowman services but is not at the point where she would refuse to come to the Bowman for symptomatic treatment that could not be controlled at home.  We discussed whether or not she would accept a blood transfusion if it would help her breath better and she said she would like to talk about it with her daughters.  I spoke with Adrienne Bowman as well as Adrienne Bowman on the phone  this morning. Then Adrienne Bowman came to the [facility] and I met with her in person at beside.  They would like to meet together her with the patient tomorrow afternoon 5/24 at approx 3:30 if Adrienne Bowman is still in house. (Both of the daughters have difficulty getting off work to come to the Bowman today).  If Adrienne Bowman has been discharged by that time, I strongly recommend a referral be made to Adrienne Bowman and Adrienne Bowman to meet with the family at Adrienne Bowman's home.  Primary decision maker:  The patient herself.  She does rely on both of her daughters for help with decisions.  The family appears to make decisions as a group.    SUMMARY OF RECOMMENDATIONS    If Adrienne Bowman feels a blood transfusion would be helpful - would need to discuss it with the family.  Would consider very low dose oxycodone for SOB, and possibly oxygen at home if indicated.  Will strongly recommend Adrienne Bowman services at home vs Adrienne Bowman to the patient and family in our meeting 5/24.  Grand Dtr asked me again about Adrienne Bowman today.  Code Status/Advance Care Planning:  DNR   Palliative Prophylaxis:   Delirium Protocol, Frequent Pain Assessment and assessment of dyspnea  Additional Recommendations (Limitations, Scope, Preferences):  My recommendation (on 5/24) to the family will be a transition to comfort measures and Adrienne Bowman care at home.    Prognosis:   < 3 months in the setting of Stage 4-5 CKD, diastolic heart failure and extensive CAD, PVD s/p AKA, and advanced age.  Discharge Planning: Home with Adrienne Bowman      Primary Diagnoses: Present on Admission:  . Chest pain . Acute encephalopathy . Acute on chronic diastolic (congestive) heart failure ([place]) . CAD (coronary artery disease) . Acute renal failure superimposed on stage 4 chronic kidney disease ([place]) . Hyperlipidemia . Impaired mobility . Protein-calorie malnutrition, severe ([place]) . Anemia of chronic disease  I have reviewed the  medical record, interviewed the patient and family, and examined the patient. The following aspects are pertinent.  Past Medical History  Diagnosis Date  . CHF (congestive heart failure) ([place]) 2002    acute CHF while getting lower extremilty arteriograms /  Led to CABG  x6  . Hypertension     very difficult to control  . PVD (peripheral vascular disease) ([place])     extensive PVD / left AKA  . Murmur, heart     soft systolic outflow murmur and a grade 2/6 murmur of the aortic insufficiency  . S/P AKA (above knee amputation) ([facility])     left AKA with prosthesis  . DIABETES MELLITUS, TYPE II   . NEPHROPATHY, DIABETIC   . Coronary artery disease 2002    CABG x6 / left internal mammary artery graft to the LAD, a saphenous vein graft to the diagonal, a sequential saphenous vein graft to the 1st and 2nd obtuse marginal branches, and a sequential saphenous vein graft the the distal right coronary artery and posterior descending  . Anemia   . CKD (chronic kidney disease)   . Advanced age    Social History   Social History  . Marital Status: Widowed    Spouse Name: N/A  . Number of Children: N/A  . Years of Education: N/A   Social History Main Topics  . Smoking status: Never Smoker   . Smokeless tobacco: Never Used  . Alcohol Use: No  . Drug Use: No  . Sexual Activity: No   Other Topics Concern  . None   Social History Narrative   Family History  Problem Relation Age of Onset  . Coronary artery disease      prevalent in sibling  . Heart disease Mother   . Hypertension Daughter   . Cancer Son     Lung  and  Throat  . Heart attack Son   . Hyperlipidemia Daughter   . Hypertension Daughter   . Hypertension Daughter   . Hypertension Daughter    Scheduled Meds: . amLODipine  10 mg Oral Daily  . aspirin EC  81 mg Oral Daily  . atorvastatin  10 mg Oral Daily  . carvedilol  25 mg Oral BID WC  . hydrALAZINE  100 mg Oral TID  . sodium chloride flush  3 mL Intravenous Q12H    Continuous Infusions: . sodium chloride 10 mL/hr at 04/04/16 2330  . sodium chloride     PRN Meds:.acetaminophen **OR**  acetaminophen, albuterol, LORazepam, morphine injection, nitroGLYCERIN, ondansetron **OR** ondansetron (ZOFRAN) IV Medications Prior to Admission:  Prior to Admission medications   Medication Sig Start Date End Date Taking? Authorizing Provider  amLODipine (NORVASC) 10 MG tablet Take 1 tablet (10 mg total) by mouth daily. 03/16/16  Yes Minus Breeding, Bowman  aspirin EC 81 MG tablet Take 81 mg by mouth daily.   Yes Historical Provider, Bowman  atorvastatin (LIPITOR) 10 MG tablet Take 1 tablet (10 mg total) by mouth daily. 03/16/16  Yes Minus Breeding, Bowman  carvedilol (COREG) 25 MG tablet Take 1 tablet (25 mg total) by mouth 2 (two) times daily with a meal. Patient taking differently: Take 25 mg by mouth 2 (two) times daily with a meal. For CHF 08/20/15  Yes Brett Canales, PA-C  cloNIDine (CATAPRES - DOSED IN MG/24 HR) 0.3 mg/24hr patch Bowman 1 patch (0.3 mg total) onto the skin once a week. 03/16/16  Yes Minus Breeding, Bowman  famotidine (PEPCID) 20 MG tablet TAKE ONE TABLET BY MOUTH ONCE DAILY 12/07/15  Yes Adrienne Passy, Bowman  furosemide (LASIX) 40 MG tablet Take 1 tablet (40 mg total) by mouth daily. 03/30/16  Yes Ripley Fraise, Bowman  hydrALAZINE (APRESOLINE) 100 MG tablet TAKE ONE TABLET BY MOUTH THREE TIMES DAILY 12/07/15  Yes Minus Breeding, Bowman  Multiple Vitamin (MULTIVITAMIN WITH MINERALS) TABS tablet Take 1 tablet by mouth daily.   Yes Historical Provider, Bowman  nitroGLYCERIN (NITROSTAT) 0.4 MG SL tablet Bowman 1 tablet (0.4 mg total) under the tongue every 5 (five) minutes as needed for chest pain. 07/13/15  Yes Janece Canterbury, Bowman  acetaminophen (TYLENOL) 325 MG tablet Take 2 tablets (650 mg total) by mouth every 6 (six) hours as needed for mild pain (or Fever >/= 101). 04/05/16   Adrienne Lis, Bowman  albuterol (PROVENTIL) (2.5 MG/3ML) 0.083% nebulizer solution Take 3 mLs (2.5 mg total) by  nebulization every 6 (six) hours as needed for wheezing or shortness of breath. 04/05/16   Adrienne Lis, Bowman   No Known Allergies Review of Systems:  Denies CP, SOB, anorexia, Falls, vomiting, changes in bowel habits and dysuria.  Physical Exam  Very pleasant, frail, elderly female.  Mentally sharp for 82 years old. CV brady, no obvious M/r/g Resp:  CTA Abdomen:  Active bowel sounds, firm with slight distention, NT Extremities:  No edema, left AKA.  Able to move all 4.  Vital Signs: BP 158/54 mmHg  Pulse 77  Temp(Src) 98.2 F (36.8 C) (Oral)  Resp 18  Ht _0  (1.575 m)  Wt 42.82 kg (94 lb 6.4 oz)  BMI 17.26 kg/m2  SpO2 100%        SpO2: SpO2: 100 % O2 Device:SpO2: 100 % O2 Flow Rate: .O2 Flow Rate (L/min): 2 L/min  IO: Intake/output summary:  Intake/Output Summary (Last 24 hours) at 04/05/16 0926 Last data filed at 04/05/16 7829  Gross per 24 hour  Intake    240 ml  Output      0 ml  Net    240 ml    LBM:   Baseline Weight: Weight: 47.356 kg (104 lb 6.4 oz) (bed scale with prosthesis on) Most recent weight: Weight: 42.82 kg (94 lb 6.4 oz)     Palliative Assessment/Data:   Flowsheet Rows        Most Recent Value   Intake Tab    Referral Department  Hospitalist   Unit at Time of Referral  Cardiac/Telemetry Unit   Palliative Care  Primary Diagnosis  Other (Comment)   Date Notified  04/04/16   Palliative Care Type  Return patient Palliative Care   Reason for referral  End of Life Care Assistance   Date of Admission  04/04/16   Date first seen by Palliative Care  04/05/16   # of days Palliative referral response time  1 Day(s)   # of days IP prior to Palliative referral  0   Clinical Assessment    Palliative Performance Scale Score  40%   Pain Max last 24 hours  6   Pain Min Last 24 hours  2   Dyspnea Max Last 24 Hours  8   Dyspnea Min Last 24 hours  2   Psychosocial & Spiritual Assessment    Palliative Care Outcomes    Patient/Family meeting held?  Yes        Time In: 8:50 Time Out: 9:55 Time Total: 65 min. Greater than 50%  of this time was spent counseling and coordinating care related to the above assessment and plan.  Signed by: Melton Alar, PA-C   Please contact Palliative Medicine Team phone at [phone] for questions and concerns.  For individual provider: See Shea Evans

## 2016-04-06 DIAGNOSIS — Z7189 Other specified counseling: Secondary | ICD-10-CM | POA: Insufficient documentation

## 2016-04-06 LAB — CBC
HEMATOCRIT: 22.6 % — AB (ref 36.0–46.0)
HEMOGLOBIN: 7 g/dL — AB (ref 12.0–15.0)
MCH: 27.9 pg (ref 26.0–34.0)
MCHC: 31 g/dL (ref 30.0–36.0)
MCV: 90 fL (ref 78.0–100.0)
Platelets: 155 10*3/uL (ref 150–400)
RBC: 2.51 MIL/uL — ABNORMAL LOW (ref 3.87–5.11)
RDW: 16.7 % — AB (ref 11.5–15.5)
WBC: 5.1 10*3/uL (ref 4.0–10.5)

## 2016-04-06 LAB — PREPARE RBC (CROSSMATCH)

## 2016-04-06 LAB — GLUCOSE, CAPILLARY: GLUCOSE-CAPILLARY: 143 mg/dL — AB (ref 65–99)

## 2016-04-06 LAB — BASIC METABOLIC PANEL
ANION GAP: 9 (ref 5–15)
BUN: 103 mg/dL — ABNORMAL HIGH (ref 6–20)
CALCIUM: 8.2 mg/dL — AB (ref 8.9–10.3)
CHLORIDE: 106 mmol/L (ref 101–111)
CO2: 23 mmol/L (ref 22–32)
Creatinine, Ser: 4.73 mg/dL — ABNORMAL HIGH (ref 0.44–1.00)
GFR calc non Af Amer: 7 mL/min — ABNORMAL LOW (ref >60)
GFR, EST AFRICAN AMERICAN: 8 mL/min — AB (ref >60)
GLUCOSE: 165 mg/dL — AB (ref 65–99)
POTASSIUM: 4.6 mmol/L (ref 3.5–5.1)
Sodium: 138 mmol/L (ref 135–145)

## 2016-04-06 LAB — ABO/RH: ABO/RH(D): A POS

## 2016-04-06 MED ORDER — OXYCODONE HCL 5 MG PO TABS
2.5000 mg | ORAL_TABLET | Freq: Two times a day (BID) | ORAL | Status: DC
  Administered 2016-04-06 – 2016-04-07 (×3): 2.5 mg via ORAL
  Filled 2016-04-06 (×3): qty 1

## 2016-04-06 MED ORDER — SODIUM CHLORIDE 0.9 % IV SOLN: INTRAVENOUS | Status: DC

## 2016-04-06 MED ORDER — FUROSEMIDE 10 MG/ML IJ SOLN
10.0000 mg | Freq: Once | INTRAMUSCULAR | Status: AC
  Administered 2016-04-06: 10 mg via INTRAVENOUS
  Filled 2016-04-06: qty 2

## 2016-04-06 MED ORDER — SODIUM CHLORIDE 0.9 % IV SOLN: Freq: Once | INTRAVENOUS | Status: DC

## 2016-04-06 NOTE — Consult Note (Signed)
   [facility]   04/06/2016  Urbano HeirJessie B Chain [birth year] [id number]  Patient screened for potential [facility] Care Management services. Patient is eligible for [facility] with her Medicare/[facility]. Electronic medical record reveals patient's discharge plan is likely with Hospice and will therefore  receive full care management needs with that program. [facility] Care [facility] services not appropriate at this time. If patient's post hospital needs change,  please place a [facility] consult. For questions please contact:   Charlesetta ShanksVictoria Carissa Musick, RN BSN CCM [facility] Liaison  [phone] business mobile phone Toll free office [phone]

## 2016-04-06 NOTE — Progress Notes (Signed)
Daily Progress Note   Patient Name: Adrienne Bowman       Date: 04/06/2016 DOB: [birth year]  Age: 82 y.o. MRN#: [id number] Attending Physician: Alison MurrayAlma M Devine, MD Primary Care Physician: [name], MD Admit Date: 04/04/2016   82 y.o. female with past medical history of CHF, Anemia, CKD 4, PVD s/p AKA, CAD s/p CABG, who was admitted in March and then again earlier in May of this year with CHF and chest pain. She also had a visit to the ER for the same recently. She was admitted on 04/04/2016 with chest pain, and encephalopathy her BNP was 4227.   Reason for Consultation/Follow-up: Hospice Evaluation, Non pain symptom management and Psychosocial/spiritual support  Subjective:  From my conversation with Adrienne Bowman this morning -  "I'm so tired, maybe God will call me home soon."  "Why do I go home from the hospital just to get SOB and come right back to the ER?" "Give it to me straight - I'm not going to get better am I?".    Adrienne Bowman went on to say - this is no way to live, being pinned in and can't do anything for yourself.  She said - listen to how I'm talking, I can't even speak without being short of breath.  We discussed being at end of life.  We discussed a blood transfusion and oxycodone for temporary relief from SOB.    This afternoon, a family meeting was held with the patient and her daughters Adrienne Bowman and Adrienne Bowman.  We discussed the process of death - eating / drinking less, sleeping more and that their mother likely has weeks to months left to live. We discussed hospice services & the daughters agreed to have hospice come into their mother's home.  They selected hospice of  as [facility] and when the time comes - the patient would like to be there as  well.   Length of Stay: 2  Current Medications: Scheduled Meds:  . amLODipine  10 mg Oral Daily  . aspirin EC  81 mg Oral Daily  . atorvastatin  10 mg Oral Daily  . carvedilol  25 mg Oral BID WC  . feeding supplement  1 Container Oral TID BM  . hydrALAZINE  100 mg Oral TID  . oxyCODONE  2.5 mg Oral BID  . sodium chloride flush  3 mL Intravenous Q12H    Continuous Infusions: . sodium chloride 10 mL/hr at 04/04/16 2330    PRN Meds: acetaminophen **OR** acetaminophen, albuterol, morphine injection, nitroGLYCERIN, ondansetron **OR** ondansetron (ZOFRAN) IV  Physical Exam       Elderly, frail, very pleasant.  Speech sounds as though the patient's tongue is very dry. CV irreg Resp NAD abd Thin, NT, +Distended. Extremities:  Left AKA.  No edema.    Vital Signs: BP 137/39 mmHg  Pulse 72  Temp(Src) 98.2 F (36.8 C) (Oral)  Resp 18  Ht  (1.575 m)  Wt 42.7 kg (94 lb 2.2 oz)  BMI 17.21 kg/m2  SpO2 100% SpO2: SpO2: 100 % O2 Device: O2 Device: Nasal Cannula O2 Flow Rate: O2 Flow Rate (L/min): 2 L/min  Intake/output summary:  Intake/Output Summary (Last 24 hours) at 04/06/16 1610 Last data filed at 04/06/16 0600  Gross per 24 hour  Intake   1140 ml  Output      0 ml  Net   1140 ml   LBM: Last BM Date: 04/04/16 Baseline Weight: Weight: 47.356 kg (104 lb 6.4 oz) (bed scale with prosthesis on) Most recent weight: Weight: 42.7 kg (94 lb 2.2 oz)       Palliative Assessment/Data:    Flowsheet Rows        Most Recent Value   Intake Tab    Referral Department  Hospitalist   Unit at Time of Referral  Cardiac/Telemetry Unit   Palliative Care Primary Diagnosis  Other (Comment)   Date Notified  04/04/16   Palliative Care Type  Return patient Palliative Care   Reason for referral  End of Life Care Assistance   Date of Admission  04/04/16   Date first seen by Palliative Care  04/05/16   # of days Palliative referral response time  1 Day(s)   # of days IP prior to  Palliative referral  0   Clinical Assessment    Palliative Performance Scale Score  40%   Pain Max last 24 hours  6   Pain Min Last 24 hours  2   Dyspnea Max Last 24 Hours  8   Dyspnea Min Last 24 hours  2   Psychosocial & Spiritual Assessment    Palliative Care Outcomes    Patient/Family meeting held?  Yes      Patient Active Problem List   Diagnosis Date Noted  . Iron deficiency anemia   . Palliative care encounter   . Encounter for hospice care discussion   . Acute encephalopathy 04/04/2016  . Acute renal failure superimposed on stage 4 chronic kidney disease ([facility]) 04/04/2016  . Anemia of chronic disease 04/04/2016  . Acute on chronic diastolic (congestive) heart failure ([facility]) 03/20/2016  . Protein-calorie malnutrition, severe ([facility]) 01/20/2016  . Hyperlipidemia   . Chest pain 07/12/2015  . S/P CABG (coronary artery bypass graft) 07/12/2015  . S/P AKA (above knee amputation) unilateral ([facility]) 07/12/2015  . Impaired mobility 08/22/2011  . CAD (coronary artery disease) 05/30/2011    Palliative Care Assessment & Plan   Assessment: Patient is certainly hospice eligible given her very advanced coronary artery disease, her advanced kidney disease and her heart failure combined with being 89 years young.  Adrienne Bowman is unable to transfer from bed to chair without significant SOB.    Recommendations/Plan: 1 unit PRBC secondary to HGB of 7.0 and dyspnea. Will schedule low dose oxy 2.5 mg  due to dyspnea at rest and with speech. [facility] to provide services at home and transition to [facility] when appropriate.   Goals of Care and Additional Recommendations:  Limitations on Scope of Treatment: Avoid Hospitalization and Minimize Medications  Code Status:DNR  Prognosis:   < 6 weeks possibly less.  Patient is certainly hospice eligible given her very advanced coronary artery disease, her advanced kidney disease and her heart failure combined with being 89 years young.  Adrienne Bowman is  unable to transfer from bed to chair without significant SOB.  She may very well be residential hospice home appropriate.  Discharge Planning:  Home with Hospice  Care plan was discussed with patient and daughters.  Thank you for allowing the Palliative Medicine Team to assist in the care of this patient.   Time In: 3:30 Time Out: 4:15 Total Time 45 min Prolonged Time Billed no}       Greater than 50%  of this time was spent counseling and coordinating care related to the above assessment and plan.  Stephani Police, PA-C  Please contact Palliative Medicine Team phone at [phone] for questions and concerns.

## 2016-04-06 NOTE — Progress Notes (Signed)
No charge note.  Palliative meeting scheduled at 3:30 pm today.

## 2016-04-06 NOTE — Progress Notes (Addendum)
Patient ID: Adrienne Bowman Bowman, female   DOB: 1918-08-15, 82 y.o.   MRN: [id number]  PROGRESS NOTE    Adrienne Bowman Adrienne  WUJ:[id number] DOB: 1918-08-15 DOA: 04/04/2016  PCP: Adrienne Mannanalia Aron, MD   Brief Narrative:  82 y.o. female with medical history significant for hypertension, dyslipidemia, chronic diastolic CHF, last 2 D ECHO in 01/2016 with grade 1 DD, preserved EF, CAD s/p CABG, left AKA [facility] with initial complaint of left sided chest pressure started on the day of the admission and per family lasted for 10 hours, constant, non radiating, at rest associated with shortness of breath. Pain was 6/10 in intensity and not relieved with aspirin or nitroglycerin at home.  In ED, patient was hemodynamically stable. Blood work showed Hgb 8.5, creatinine 4.85 (Cr 4.17 on 03/30/2016). The 12 lead EKG was done 3 time with variable findings but overall ST segment depressions in inferior, septal leads. She is DNR and per family, no aggressive measures.   Assessment & Plan:   Principal Problem:  Acute encephalopathy - Llikely dehydration, renal insufficiency - Much better mental status this morning - Per physical therapy evaluation, home health PT  Active Problems:  Chest pain / mild troponin elevation - Troponin level 0.07, 0.11, 0.11, likely reflective of demand ischemia in the setting of worsening renal failure - Per 2-D echo in March 2017 showed grade 1 diastolic dysfunction with preserved ejection fraction - She has no chest pain at this time - Her 12-lead EKG on the admission did show T-wave inversions and ST segment depression in inferior leads on 2 different EKGs but per family they did not want any aggressive workup - Continue aspirin daily   Acute on chronic diastolic (congestive) heart failure (HCC) / CAD (coronary artery disease) / S/P CABG (coronary artery bypass graft) - 2 D ECHO in 01/2016 with grade 1 DD and preserved EF - Lasix not started at the time of the  admission because patient clinically looked dehydrated in addition to worsening creatinine  - Appreciate palliative care following - Continue carvedilol 25 mg twice daily - Continue daily aspirin   Impaired mobility - Per physical therapy, home health PT on discharge    Essential hypertension - Continue Norvasc 10 mg daily, carvedilol 25 mg twice daily and hydralazine 100 mg 3 times daily   S/P AKA (above knee amputation) unilateral (HCC) - Stable    Hyperlipidemia - Continue statin therapy   Protein-calorie malnutrition, severe (HCC) - In the context of chronic illness - patient was seen by nutritionist    Acute renal failure superimposed on stage 4 chronic kidney disease (HCC) - Creatinine was low but better yesterday 2.69 on 04/05/2016 and then this morning again 4.73. It would be reasonable to reinitiate IV fluids at a low rate, watching for fluid overload and see if creatinine improves - Lasix remains on hold - Creatinine as high as 5.2 on 05/22/2007i   Anemia of chronic disease - Likely secondary to anemia of chronic kidney disease - Hemoglobin is 7 this morning and will give 1 unit of PRBC today - Please note patient is on aspirin, we'll follow-up CBC tomorrow and if there is a further drop in hemoglobin will stop aspirin    DVT prophylaxis: SCDs bilaterally, aspirin Code Status: DNR/DNI  Family Communication: Family not at the bedside this morning Disposition Plan: Anticipate discharge by 04/06/2016 depending on ET evaluation and palliative care evaluation   Consultants:   Palliative care  Physical therapy  Procedures:  1 U PRBC 04/06/2016   Antimicrobials:   None   Subjective: No overnight events. More alert this am.  Objective: Filed Vitals:   04/05/16 1710 04/05/16 2251 04/06/16 0000 04/06/16 0353  BP: 133/47 133/42 122/37 137/39  Pulse: 74 76 74 72  Temp: 97.7 F (36.5 C)  98.8 F (37.1 C) 98.2 F (36.8 C)  TempSrc: Oral Oral Oral Oral   Resp: Height:      Weight:    42.7 kg (94 lb 2.2 oz)  SpO2: 100% 100% 100% 100%    Intake/Output Summary (Last 24 hours) at 04/06/16 0643 Last data filed at 04/06/16 0600  Gross per 24 hour  Intake   1380 ml  Output      0 ml  Net   1380 ml   Filed Weights   04/04/16 2334 04/05/16 0316 04/06/16 0353  Weight: 44.634 kg (98 lb 6.4 oz) 42.82 kg (94 lb 6.4 oz) 42.7 kg (94 lb 2.2 oz)    Examination:  General exam: Appears calm, no acute distress  Respiratory system: bilateral air entry, no wheezing rate control Cardiovascular system: Rate controlled, appreciate S1-S2  Gastrointestinal system:  appreciate bowel sounds, nontender abdomen  Central nervous system:  no focal deficits, more alert this morning Extremities: left aka, RLE palpable pulses Skin: No ulcers or lesions Psychiatry:  mood and behavior normal, not agitated or restless   Data Reviewed: I have personally reviewed following labs and imaging studies  CBC:  Recent Labs Lab 03/30/16 1743 04/04/16 1626 04/04/16 1637 04/05/16 0244  WBC 4.8 7.1  --  5.2  NEUTROABS  --  5.3  --   --   HGB 8.7* 8.5* 9.9* 7.5*  HCT 27.2* 27.5* 29.0* 23.9*  MCV 89.2 88.1  --  88.2  PLT 207 226  --  183   Basic Metabolic Panel:  Recent Labs Lab 03/30/16 1743 04/04/16 1626 04/04/16 1637 04/05/16 0244  NA 141 139 142 142  K 4.5 4.4 4.5 4.3  CL 106 108 106 109  CO2 22 23  --  23  GLUCOSE 124* 188* 182* 104*  BUN 75* 91* 100* 95*  CREATININE 4.13* 4.86* 5.20* 2.69*  CALCIUM 8.9 8.7*  --  8.7*   GFR: Estimated Creatinine Clearance: 8.4 mL/min (by C-G formula based on Cr of 2.69). Liver Function Tests:  Recent Labs Lab 04/04/16 1626 04/05/16 0244  AST 60* 43*  ALT 56* 47  ALKPHOS 67 61  BILITOT 0.6 0.6  PROT 6.7 6.0*  ALBUMIN 3.4* 3.2*   No results for input(s): LIPASE, AMYLASE in the last 168 hours. No results for input(s): AMMONIA in the last 168 hours. Coagulation Profile:  Recent Labs Lab  04/04/16 1626  INR 1.22   Cardiac Enzymes:  Recent Labs Lab 04/04/16 2109 04/05/16 0244 04/05/16 0824  TROPONINI 0.07* 0.11* 0.11*   BNP (last 3 results) No results for input(s): PROBNP in the last 8760 hours. HbA1C: No results for input(s): HGBA1C in the last 72 hours. CBG:  Recent Labs Lab 04/05/16 0721 04/06/16 0602  GLUCAP 92 143*   Lipid Profile: No results for input(s): CHOL, HDL, LDLCALC, TRIG, CHOLHDL, LDLDIRECT in the last 72 hours. Thyroid Function Tests:  Recent Labs  04/04/16 2059  TSH 6.562*   Anemia Panel: No results for input(s): VITAMINB12, FOLATE, FERRITIN, TIBC, IRON, RETICCTPCT in the last 72 hours. Urine analysis:    Component Value Date/Time   COLORURINE YELLOW 03/20/2016 0833   APPEARANCEUR  CLEAR 03/20/2016 0833   LABSPEC 1.013 03/20/2016 0833   PHURINE 5.5 03/20/2016 0833   GLUCOSEU NEGATIVE 03/20/2016 0833   HGBUR NEGATIVE 03/20/2016 0833   BILIRUBINUR NEGATIVE 03/20/2016 0833   KETONESUR NEGATIVE 03/20/2016 0833   PROTEINUR 100* 03/20/2016 0833   NITRITE NEGATIVE 03/20/2016 0833   LEUKOCYTESUR NEGATIVE 03/20/2016 0833   Sepsis Labs: @LABRCNTIP (procalcitonin:4,lacticidven:4)   )No results found for this or any previous visit (from the past 240 hour(s)).    Radiology Studies: Dg Chest Port 1 View 04/04/2016  Mild prominence of the perihilar and bibasilar markings suggesting minimal interstitial edema and less likely infection. Borderline stable cardiomegaly. Electronically Signed   By: Elberta Fortis M.D.   On: 04/04/2016 16:31     Scheduled Meds: . amLODipine  10 mg Oral Daily  . aspirin EC  81 mg Oral Daily  . atorvastatin  10 mg Oral Daily  . carvedilol  25 mg Oral BID WC  . feeding supplement  1 Container Oral TID BM  . hydrALAZINE  100 mg Oral TID  . sodium chloride flush  3 mL Intravenous Q12H   Continuous Infusions: . sodium chloride 10 mL/hr at 04/04/16 2330     LOS: 2 days    Time spent: 25 minutes  Greater  than 50% of the time spent on counseling and coordinating the care.   Manson Passey, MD [facility] Pager [phone]  If 7PM-7AM, please contact night-coverage [email or web address] Password [facility] 04/06/2016, 6:43 AM

## 2016-04-06 NOTE — Progress Notes (Signed)
CM talked to patient with family members present about home hospice choice, they [facility]. Referral made as requested. Adrienne DerrickB Jahon Bart [facility] HospitalRN,MHA,BSN [phone]

## 2016-04-06 NOTE — Care Management Important Message (Signed)
Important Message  Patient Details  Name: Adrienne Bowman MRN: [id number] Date of Birth: Jun 23, 1918   Medicare Important Message Given:  Yes    Bernadette HoitShoffner, Latria Mccarron Coleman 04/06/2016, 11:20 AM

## 2016-04-07 ENCOUNTER — Telehealth: Payer: Self-pay

## 2016-04-07 LAB — TYPE AND SCREEN
ABO/RH(D): A POS
ANTIBODY SCREEN: NEGATIVE
UNIT DIVISION: 0

## 2016-04-07 LAB — BASIC METABOLIC PANEL
Anion gap: 9 (ref 5–15)
BUN: 109 mg/dL — ABNORMAL HIGH (ref 6–20)
CHLORIDE: 106 mmol/L (ref 101–111)
CO2: 21 mmol/L — AB (ref 22–32)
CREATININE: 4.91 mg/dL — AB (ref 0.44–1.00)
Calcium: 8.4 mg/dL — ABNORMAL LOW (ref 8.9–10.3)
GFR calc non Af Amer: 7 mL/min — ABNORMAL LOW (ref >60)
GFR, EST AFRICAN AMERICAN: 8 mL/min — AB (ref >60)
Glucose, Bld: 118 mg/dL — ABNORMAL HIGH (ref 65–99)
POTASSIUM: 5.2 mmol/L — AB (ref 3.5–5.1)
Sodium: 136 mmol/L (ref 135–145)

## 2016-04-07 LAB — HEMOGLOBIN AND HEMATOCRIT, BLOOD
HCT: 25.5 % — ABNORMAL LOW (ref 36.0–46.0)
Hemoglobin: 8.1 g/dL — ABNORMAL LOW (ref 12.0–15.0)

## 2016-04-07 LAB — GLUCOSE, CAPILLARY: Glucose-Capillary: 108 mg/dL — ABNORMAL HIGH (ref 65–99)

## 2016-04-07 MED ORDER — OXYCODONE HCL 5 MG PO TABS: 2.5000 mg | ORAL_TABLET | Freq: Three times a day (TID) | ORAL | Status: AC | PRN

## 2016-04-07 MED ORDER — SODIUM POLYSTYRENE SULFONATE 15 GM/60ML PO SUSP
15.0000 g | Freq: Once | ORAL | Status: AC
  Administered 2016-04-07: 15 g via ORAL
  Filled 2016-04-07: qty 60

## 2016-04-07 MED ORDER — BOOST / RESOURCE BREEZE PO LIQD: 1.0000 | Freq: Three times a day (TID) | ORAL | Status: AC

## 2016-04-07 MED ORDER — MORPHINE SULFATE (PF) 2 MG/ML IV SOLN
1.0000 mg | INTRAVENOUS | Status: DC | PRN
Start: 2016-04-07 — End: 2016-04-07
  Administered 2016-04-07 (×2): 2 mg via INTRAVENOUS
  Filled 2016-04-07 (×2): qty 1

## 2016-04-07 MED ORDER — ALBUTEROL SULFATE (2.5 MG/3ML) 0.083% IN NEBU
2.5000 mg | INHALATION_SOLUTION | Freq: Once | RESPIRATORY_TRACT | Status: AC
  Administered 2016-04-07: 2.5 mg via RESPIRATORY_TRACT

## 2016-04-07 NOTE — Progress Notes (Signed)
[facility]. Report called. Family at bedside and will take patient belongings. Pt stable and waiting for transportation.   Jilda PandaBethany Jaskaran Dauzat RN

## 2016-04-07 NOTE — Progress Notes (Signed)
Patient is for discharge home today [facility]; PetersburgStacie with hospice called and updated. She will order oxygen for transport home and more oxygen tanks will be delivered to the home. Stacie also stated that patient will is transported home by the family in private vehicle. Abelino DerrickB Sruthi Maurer [facility] LtdRN,MHA,BSN [phone]

## 2016-04-07 NOTE — Telephone Encounter (Signed)
Dois DavenportSandra with [facility] left v/m; pt is being discharged from hospital 04/07/16. Dois DavenportSandra request cb with verbal order that Dr Dayton MartesAron will be attending physician for pt and does Dr Dayton MartesAron want [facility] orders activated.

## 2016-04-07 NOTE — Telephone Encounter (Signed)
Spoke to Deer CreekSandra and provided verbal orders

## 2016-04-07 NOTE — Progress Notes (Signed)
[facility] RN Liason Note  Clarified with family that patient is being discharged to the granddaughter's home at [address], [phone]    [facility] referral center and [facility] equipment manager notified of change of address.    Thank you   Lavone NeriSusan Aneth Schlagel, [facility] Liason [phone]

## 2016-04-07 NOTE — Clinical Social Work Note (Addendum)
CSW contacted Reed BreechSue Thomas with [facility] [phone]) to confirm that she received referral for patient. She is working on referral now and should have an answer on a bed by 2:00 pm today.  Charlynn CourtSarah Kaitlyn Skowron, CSW [phone]  [facility]. Will schedule PTAR around 3:30 pm pending discharge order. CSW paged MD letting her know that patient does have a bed now.  Charlynn CourtSarah Ashaunte Standley, CSW [phone]

## 2016-04-07 NOTE — Progress Notes (Signed)
MC- 3E-18  [facility] RN Liason note  Request received now for [facility] to [facility]  instead of going home as originally planned.  I have had discussion with the granddaughter over the phone and she stated that family is  [facility] today.    RN is currently looking into bed availability and having chart reviewed for eligibility.   SW Charlynn CourtSarah Boswell aware.    Discussed also with daughter that if no bed available, patient may require ambulance for transfer  Home rather than automobile.      Will update as soon as availability is determined.   Thank you!    Please call with any questions.   Lavone NeriSusan Ainhoa Rallo, Rn  [facility] Hospital Liason  [phone]

## 2016-04-07 NOTE — Progress Notes (Signed)
[facility] - 3E-18 [facility] RN Liason Note    [facility] is able to offer patient a bed this afternoon.   Met with both daughters and granddaughter to confirm interest and explain services.   All are agreeable for patient to transfer to [facility] today via PTAR.    Registration paperwork is complete. Dr. Orpah Melter to assume care per family request.  Please fax discharge summary to [phone].  RN please call report to [phone].   Please arrange for transport as soon as possible.    Thank you!    Mickie Kay, [facility] Liason [phone]

## 2016-04-07 NOTE — Progress Notes (Signed)
Notified by Wilber BihariMarianne Yost PA and Jiles CrockerBrenda Chandler,  CMRN of family request for [facility] at [facility] after discharge. Chart and patient information currently under review to confirm hospice eligibility.  Spoke with the patient  at bedside to initiate education related to hospice philosophy, services and team approach to care.   Patient  verbalized understanding of the information provided.  I did also contact the patient's granddaughter Adrienne Bowman to discuss Hospice Services over the phone.   Per discussion plan is for discharge to home by personal vehicle with daughter this afternoon after daughter gets home from work around 3 pm.   Please send signed completed DNR form home with patient.  Patient will need prescriptions for discharge comfort medications.   DME needs discussed with patient and granddaughter.  Patient has a w/c, walker and BSC in the home.  She does not want a hospital bed at this time.  I did order an oxygen concentrator, nebulizer and back up tanks to be be delivered today.    [facility] equipment manager Jewel Kizzie BaneHughes notified and will contact [facility] to arrange delivery to the home. I have also contacted the [facility] rep to provide patient with a portable oxygen tank for transfer home.    An AV nurse is scheduled to complete admission at her home this afternoon.    The home address has been verified.   I have asked the granddaughter to clarify whether the patient is going to her home address or her daughter's home at discharge.      Granddaughter Adrienne Bowman is the family member to be contacted to arrange time of delivery.   Number is [phone] [facility] aware of the above.  Completed discharge summary will need to be faxed to [facility] at [phone] when final.  Please notify [facility] when patient is ready to leave unit at discharge-call [phone].  [facility] information and contact numbers have been given to the patient and granddaughter . during  visit.  Above information shared with Jiles CrockerBrenda Chandler, CMRN.  Please call with any questions.   Adrienne NeriSusan Cattaleya Wien, RN [facility] Liason  [phone]

## 2016-04-07 NOTE — Progress Notes (Signed)
  Potassium slightly above baseline, 5.2. Will give 1 low dose of Kayexalate 15 g prior to discharge.  Manson Passeylma Chanah Tidmore [facility] [phone]

## 2016-04-07 NOTE — Clinical Social Work Note (Signed)
CSW facilitated patient discharge including contacting patient family and facility to confirm patient discharge plans. Clinical information faxed to facility and family agreeable with plan. CSW arranged ambulance transport via PTAR to [facility]. RN to call report prior to discharge ([phone]).  CSW will sign off for now as social work intervention is no longer needed. Please consult us again if new needs arise.  Ashana Tullo, CSW [phone]  

## 2016-04-07 NOTE — Clinical Social Work Note (Signed)
Clinical Social Work Assessment  Patient Details  Name: Adrienne Bowman MRN: [id number] Date of Birth: Jun 08, 1918  Date of referral:  04/07/16               Reason for consult:  Facility Placement, Discharge Planning, End of Life/Hospice                Permission sought to share information with:  Facility Medical sales representativeContact Representative, Family Supports Permission granted to share information::  Yes, Verbal Permission Granted  Name::     Jeri Coslva Cook  [facility]  Relationship::  Daughter  Contact Information:  [phone]  Housing/Transportation Living arrangements for the past 2 months:  Single Family Home Source of Information:  Medical Team, Adult Children Patient Interpreter Needed:  None Criminal Activity/Legal Involvement Pertinent to Current Situation/Hospitalization:  No - Comment as needed Significant Relationships:  Adult Children, Other Family Members Lives with:  Self Do you feel safe going back to the place where you live?  Yes Need for family participation in patient care:  Yes (Comment)  Care giving concerns:  Patient in need of residential hospice.   Social Worker assessment / plan:  CSW contacted patient's daughter, Jeri Coslva Cook, to confirm family's desire for residential hospice, [facility]. RNCM made CSW aware. CSW introduced role and explained that CSW was confirming referral. PTAR needed. No further concerns. CSW encouraged patient's daughter to contact CSW as needed. CSW will continue to follow patient and her family and facilitate [facility] once ready for discharge. CSW also confirmed referral with Reed [facility]ree[facility]Sue Thomas [facility]. Patient definitely has a bed today.  Employment status:  Retired Health and safety inspectornsurance information:  Medicare PT Recommendations:  Not assessed at this time Information / Referral to community resources:  Other (Comment Required) (Residential Hospice)  Patient/Family's Response to care:  Patient and family agreeable to  residential hospice placement. Patient's family supportive and involved in patient's care. Patient's family polite and appreciated social work intervention.  Patient/Family's Understanding of and Emotional Response to Diagnosis, Current Treatment, and Prognosis:  Patient and her family aware of medical interventions and the need for residential hospice placement following discharge.  Emotional Assessment Appearance:  Appears stated age Attitude/Demeanor/Rapport:  Unable to Assess Affect (typically observed):  Unable to Assess Orientation:  Oriented to Self, Oriented to Place, Oriented to  Time, Oriented to Situation Alcohol / Substance use:  Never Used Psych involvement (Current and /or in the community):  No (Comment)  Discharge Needs  Concerns to be addressed:  Care Coordination Readmission within the last 30 days:  Yes Current discharge risk:  Terminally ill Barriers to Discharge:  No Barriers Identified   Margarito LinerSarah C Rupa Lagan, LCSW 04/07/2016, 2:40 PM

## 2016-04-07 NOTE — Discharge Summary (Addendum)
Physician Discharge Summary  Adrienne Bowman AVW:[id number] DOB: 11/14/1918 DOA: 04/04/2016  PCP: Ruthe Mannan, MD  Admit date: 04/04/2016 Discharge date: 04/07/2016  Recommendations for Outpatient Follow-up:  Please continue to hold Lasix until kidney function normalizes.     Discharge instructions    Complete by:  As directed  Please hold aspirin for at least 1 week on discharge due to risk of bleeding. Hemoglobin 8.1 on discharge.       Discharge Diagnoses:  Principal Problem:   Acute encephalopathy Active Problems:   Chest pain   Acute on chronic diastolic (congestive) heart failure (HCC)   CAD (coronary artery disease)   Impaired mobility   S/P CABG (coronary artery bypass graft)   S/P AKA (above knee amputation) unilateral (HCC)   Hyperlipidemia   Protein-calorie malnutrition, severe (HCC)   Acute renal failure superimposed on stage 4 chronic kidney disease (HCC)   Anemia of chronic disease   Iron deficiency anemia   Palliative care encounter   Encounter for hospice care discussion   Goals of care, counseling/discussion    Discharge Condition: stable; home hospice to follow   Diet recommendation: as tolerated   History of present illness:  82 y.o. female with medical history significant for hypertension, dyslipidemia, chronic diastolic CHF, last 2 D ECHO in 01/2016 with grade 1 DD, preserved EF, CAD s/p CABG, left AKA who presented to [facility] from home with initial complaint of left sided chest pressure started on the day of the admission and per family lasted for 10 hours, constant, non radiating, at rest associated with shortness of breath. Pain was 6/10 in intensity and not relieved with aspirin or nitroglycerin at home.  In ED, patient was hemodynamically stable. Blood work showed Hgb 8.5, creatinine 4.85 (Cr 4.17 on 03/30/2016). The 12 lead EKG was done 3 time with variable findings but overall ST segment depressions in inferior, septal leads. She is DNR and per family,  no aggressive measures.   Hospital Course:   Assessment & Plan:  Principal Problem:  Acute encephalopathy - Llikely dehydration, renal insufficiency - Better mental status this morning - Per physical therapy evaluation, home health PT; per palliative home hospice to follow on discharge  Active Problems:  Chest pain / mild troponin elevation - Troponin level 0.07, 0.11, 0.11, likely reflective of demand ischemia in the setting of worsening renal failure - Per 2-D echo in March 2017 showed grade 1 diastolic dysfunction with preserved ejection fraction - Chest pain free - Her 12-lead EKG on the admission did show T-wave inversions and ST segment depression in inferior leads on 2 different EKGs but per family they did not want any aggressive workup - Because of drop in hemoglobin yesterday to 7 we recommended to hold aspirin for 1 week on discharge   Acute on chronic diastolic (congestive) heart failure (HCC) / CAD (coronary artery disease) / S/P CABG (coronary artery bypass graft) - 2 D ECHO in 01/2016 with grade 1 DD and preserved EF - Lasix not started at the time of the admission because patient clinically looked dehydrated in addition to worsening creatinine  - Appreciate palliative care following - Continue carvedilol 25 mg twice daily - We will continue to hold Lasix on discharge the patient seen by primary care physician who can recheck kidney function and determine if it safe to resume Lasix   Impaired mobility - Per physical therapy, home health PT on discharge   Essential hypertension - Continue Norvasc 10 mg daily, carvedilol 25 mg twice  daily and hydralazine 100 mg 3 times daily   S/P AKA (above knee amputation) unilateral (HCC) - Stable    Hyperlipidemia - Continue statin therapy   Protein-calorie malnutrition, severe (HCC) - In the context of chronic illness - Seen by nutritionist   Acute renal failure superimposed on stage 4 chronic kidney disease  (HCC) - Worsening creatinine likely due to Lasix - Lasix remains on hold - Creatinine as high as 5.2 on 05/22/2007i   Anemia of chronic disease - Likely secondary to anemia of chronic kidney disease - Hemoglobin 7 on 04/06/2016. Patient has received 1 unit of PRBC transfusion - Continue to hold aspirin on discharge for one week. Follow-up with primary care physician who can recheck hemoglobin and determine if aspirin can be resumed.   DVT prophylaxis: SCDs bilaterally Code Status: DNR/DNI  Family Communication: Family not at the bedside this morning; called patient's daughter over the phone and discussed discharge plan, patient's daughter agrees with discharge plan. I also spoke with patient's daughter about holding aspirin and Lasix on discharge and following with primary care physician who can recheck kidney function and hemoglobin and determine when is it safe to resume those medications.   Consultants:   Palliative care  Physical therapy  Procedures:  1 U PRBC 04/06/2016  Antimicrobials:   None   Signed:  Manson Passey, MD  [facility] 04/07/2016, 8:54 AM  Pager #: [phone]  Time spent in minutes: more than 30 minutes   Discharge Exam: Filed Vitals:   04/06/16 1944 04/07/16 0500  BP: 130/88 138/100  Pulse: 67 70  Temp: 97.7 F (36.5 C) 99 F (37.2 C)  Resp: 14 15   Filed Vitals:   04/06/16 1449 04/06/16 1725 04/06/16 1944 04/07/16 0500  BP: 123/38 138/59 130/88 138/100  Pulse: 68 66 67 70  Temp: 97.5 F (36.4 C) 97 F (36.1 C) 97.7 F (36.5 C) 99 F (37.2 C)  TempSrc: Oral Oral Oral Oral  Resp: Height:      Weight:      SpO2: 100% 98% 98% 99%    General: Pt is alert, follows commands appropriately, not in acute distress Cardiovascular: Regular rate and rhythm, S1/S2 +, no murmurs Respiratory: Clear to auscultation bilaterally, no wheezing, no crackles, no rhonchi Abdominal: Soft, non tender, non distended, bowel sounds  +, no guarding Extremities: no edema, no cyanosis, pulses palpable bilaterally DP and PT Neuro: Grossly nonfocal  Discharge Instructions  Discharge Instructions    Call MD for:  difficulty breathing, headache or visual disturbances    Complete by:  As directed      Call MD for:  difficulty breathing, headache or visual disturbances    Complete by:  As directed      Call MD for:  persistant dizziness or light-headedness    Complete by:  As directed      Call MD for:  persistant dizziness or light-headedness    Complete by:  As directed      Call MD for:  persistant nausea and vomiting    Complete by:  As directed      Call MD for:  persistant nausea and vomiting    Complete by:  As directed      Call MD for:  severe uncontrolled pain    Complete by:  As directed      Call MD for:  severe uncontrolled pain    Complete by:  As directed      Diet -  low sodium heart healthy    Complete by:  As directed      Diet - low sodium heart healthy    Complete by:  As directed      Discharge instructions    Complete by:  As directed             Increase activity slowly    Complete by:  As directed      Increase activity slowly    Complete by:  As directed             Medication List    STOP taking these medications        aspirin EC 81 MG tablet     cloNIDine 0.3 mg/24hr patch  Commonly known as:  CATAPRES - Dosed in mg/24 hr     furosemide 40 MG tablet  Commonly known as:  LASIX      TAKE these medications        acetaminophen 325 MG tablet  Commonly known as:  TYLENOL  Take 2 tablets (650 mg total) by mouth every 6 (six) hours as needed for mild pain (or Fever >/= 101).     albuterol (2.5 MG/3ML) 0.083% nebulizer solution  Commonly known as:  PROVENTIL  Take 3 mLs (2.5 mg total) by nebulization every 6 (six) hours as needed for wheezing or shortness of breath.     amLODipine 10 MG tablet  Commonly known as:  NORVASC  Take 1 tablet (10 mg total) by mouth daily.      atorvastatin 10 MG tablet  Commonly known as:  LIPITOR  Take 1 tablet (10 mg total) by mouth daily.     carvedilol 25 MG tablet  Commonly known as:  COREG  Take 1 tablet (25 mg total) by mouth 2 (two) times daily with a meal.     famotidine 20 MG tablet  Commonly known as:  PEPCID  TAKE ONE TABLET BY MOUTH ONCE DAILY     feeding supplement Liqd  Take 1 Container by mouth 3 (three) times daily between meals.     hydrALAZINE 100 MG tablet  Commonly known as:  APRESOLINE  TAKE ONE TABLET BY MOUTH THREE TIMES DAILY     multivitamin with minerals Tabs tablet  Take 1 tablet by mouth daily.     nitroGLYCERIN 0.4 MG SL tablet  Commonly known as:  NITROSTAT  Place 1 tablet (0.4 mg total) under the tongue every 5 (five) minutes as needed for chest pain.           Follow-up Information    Follow up with Ruthe Mannan, MD. Schedule an appointment as soon as possible for a visit in 1 week.   Specialty:  Family Medicine   Why:  Follow up appt after recent hospitalization   Contact information:   [address] [place] [address] [phone]       Follow up with Ruthe Mannan, MD. Schedule an appointment as soon as possible for a visit in 2 weeks.   Specialty:  Family Medicine   Why:  Follow up appt after recent hospitalization   Contact information:   [address] [place] [address] [phone]        The results of significant diagnostics from this hospitalization (including imaging, microbiology, ancillary and laboratory) are listed below for reference.    Significant Diagnostic Studies: Dg Chest 2 View  03/30/2016  CLINICAL DATA:  Shortness of breath EXAM: CHEST  2 VIEW COMPARISON:  03/21/2016  chest radiograph. FINDINGS: Sternotomy wires appear aligned and intact. CABG clips overlie the mediastinum. Stable cardiomediastinal silhouette with mild cardiomegaly. No pneumothorax. Trace left pleural effusion. No right pleural effusion. No overt pulmonary edema. Mild  bibasilar scarring versus atelectasis. No acute consolidative airspace disease. IMPRESSION: 1. Stable mild cardiomegaly without overt pulmonary edema. 2. Trace left pleural effusion. 3. Mild bibasilar scarring versus atelectasis. Electronically Signed   By: Delbert Phenix M.D.   On: 03/30/2016 18:10   Dg Chest Port 1 View  04/04/2016  CLINICAL DATA:  Severe shortness of breath and chest pain. EXAM: PORTABLE CHEST 1 VIEW COMPARISON:  03/30/2016 FINDINGS: Sternotomy wires are unchanged. Lungs are adequately inflated with mild prominence of the perihilar and bibasilar markings likely mild interstitial edema. The no definite pleural effusion. No pneumothorax. Borderline cardiomegaly. Moderate calcified over the thoracoabdominal aorta. Mild degenerate changes of the spine. IMPRESSION: Mild prominence of the perihilar and bibasilar markings suggesting minimal interstitial edema and less likely infection. Borderline stable cardiomegaly. Electronically Signed   By: Elberta Fortis M.D.   On: 04/04/2016 16:31   Dg Chest Port 1 View  03/21/2016  CLINICAL DATA:  CHF, hypertension, cardiac disease, previous bypass EXAM: PORTABLE CHEST 1 VIEW COMPARISON:  03/20/2016 FINDINGS: Coronary bypass changes noted. Stable cardiomegaly with slight interval improvement in the central vascular congestion and perihilar edema pattern. Minor basilar atelectasis. No enlarging effusion or pneumothorax. Aortic atherosclerosis noted. Trachea is midline. IMPRESSION: Improving CHF pattern. Electronically Signed   By: Judie Petit.  Shick M.D.   On: 03/21/2016 08:32   Dg Chest Portable 1 View  03/20/2016  CLINICAL DATA:  Generalized chest pain, nausea, and shortness of breath for unknown period of time. History of CHF, hypertension, coronary disease, and diabetes. Nonsmoker. EXAM: PORTABLE CHEST 1 VIEW COMPARISON:  01/18/2016 FINDINGS: Postoperative changes in the mediastinum. Cardiac enlargement with pulmonary vascular congestion and diffuse interstitial  edema. Pattern is similar to previous study. No blunting of costophrenic angles. No pneumothorax. Mediastinal contours appear intact. Calcified and tortuous aorta. IMPRESSION: Cardiac enlargement with pulmonary vascular congestion and diffuse interstitial edema similar to previous study. Electronically Signed   By: Burman Nieves M.D.   On: 03/20/2016 06:49    Microbiology: No results found for this or any previous visit (from the past 240 hour(s)).   Labs: Basic Metabolic Panel:  Recent Labs Lab 04/04/16 1626 04/04/16 1637 04/05/16 0244 04/06/16 0731  NA 139 142 142 138  K 4.4 4.5 4.3 4.6  CL 108 106 109 106  CO2 23  --  23 23  GLUCOSE 188* 182* 104* 165*  BUN 91* 100* 95* 103*  CREATININE 4.86* 5.20* 2.69* 4.73*  CALCIUM 8.7*  --  8.7* 8.2*   Liver Function Tests:  Recent Labs Lab 04/04/16 1626 04/05/16 0244  AST 60* 43*  ALT 56* 47  ALKPHOS 67 61  BILITOT 0.6 0.6  PROT 6.7 6.0*  ALBUMIN 3.4* 3.2*   No results for input(s): LIPASE, AMYLASE in the last 168 hours. No results for input(s): AMMONIA in the last 168 hours. CBC:  Recent Labs Lab 04/04/16 1626 04/04/16 1637 04/05/16 0244 04/06/16 0731 04/07/16 0639  WBC 7.1  --  5.2 5.1  --   NEUTROABS 5.3  --   --   --   --   HGB 8.5* 9.9* 7.5* 7.0* 8.1*  HCT 27.5* 29.0* 23.9* 22.6* 25.5*  MCV 88.1  --  88.2 90.0  --   PLT 226  --  183 155  --  Cardiac Enzymes:  Recent Labs Lab 04/04/16 2109 04/05/16 0244 04/05/16 0824  TROPONINI 0.07* 0.11* 0.11*   BNP: BNP (last 3 results)  Recent Labs  03/21/16 0533 03/30/16 1743 04/04/16 1626  BNP 2438.6* 2825.9* 4227.2*    ProBNP (last 3 results) No results for input(s): PROBNP in the last 8760 hours.  CBG:  Recent Labs Lab 04/05/16 0721 04/06/16 0602 04/07/16 0619  GLUCAP 92 143* 108*

## 2016-04-07 NOTE — Progress Notes (Signed)
No charge note.  Patient's breathing has worsened thru out the day.  She is unable to speak in full sentences - she is unable to take a bite of food due to dyspnea.  Discussed with Fannie KneeSue of [facility] and patient's family.  Will request discharge to residential Hospice facility.  Algis DownsMarianne York, [place] JerseyPA-C Palliative Medicine Pager: [phone]

## 2016-04-07 NOTE — Telephone Encounter (Signed)
Please give verbal orders that I do want hospice care activated and I can be attending physician but will need considerable input from her cardiologist.

## 2016-04-07 NOTE — Progress Notes (Signed)
Discussed potential transfer to [facility] with Adrienne BreechSue Bowman of [facility].  Adrienne KneeSue will discuss this with the family.                                                                                                                                                                                                            Daily Progress Note   Patient Name: Adrienne Bowman       Date: 04/07/2016 DOB: 09-03-1918  Age: 82 y.o. MRN#: [id number] Attending Physician: Adrienne MurrayAlma M Devine, MD Primary Care Physician: [name], MD Admit Date: 04/04/2016   82 y.o. female with past medical history of CHF, Anemia, CKD 4, PVD s/p AKA, CAD s/p CABG, who was admitted in March and then again earlier in May of this year with CHF and chest pain. She also had a visit to the ER for the same recently. She was admitted on 04/04/2016 with chest pain, and encephalopathy her BNP was 4227.   Reason for Consultation/Follow-up: Hospice Evaluation, Non pain symptom management and Psychosocial/spiritual support  Subjective:  "I can't get a deep breath" Patient appears to have more work of breathing today.   Appears more appropriate for [facility].   Length of Stay: 3  Current Medications: Scheduled Meds:  . sodium chloride   Intravenous Once  . amLODipine  10 mg Oral Daily  . aspirin EC  81 mg Oral Daily  . atorvastatin  10 mg Oral Daily  . carvedilol  25 mg Oral BID WC  . feeding supplement  1 Container Oral TID BM  . hydrALAZINE  100 mg Oral TID  . oxyCODONE  2.5 mg Oral BID  . sodium chloride flush  3 mL Intravenous Q12H  . sodium polystyrene  15 g Oral Once    Continuous Infusions: . sodium chloride 10 mL/hr at 04/04/16 2330    PRN Meds: acetaminophen **OR** acetaminophen, albuterol, morphine injection, nitroGLYCERIN, ondansetron **OR** ondansetron (ZOFRAN) IV  Physical Exam       Elderly, frail, very pleasant.  Having to gasp slightly while talking. CV irreg Resp NAD abd Thin, NT,  +Distended. Extremities:  Left AKA.  No edema.    Vital Signs: BP 138/100 mmHg  Pulse 70  Temp(Src) 99 F (37.2 C) (Oral)  Resp 15  Ht 5\' 2"  (1.575 m)  Wt 42.7 kg (94 lb 2.2 oz)  BMI 17.21 kg/m2  SpO2 99% SpO2: SpO2: 99 % O2 Device: O2 Device: Nasal Cannula O2 Flow Rate: O2 Flow Rate (L/min): 2 L/min  Intake/output summary:   Intake/Output Summary (  Last 24 hours) at 04/07/16 1114 Last data filed at 04/07/16 1610  Gross per 24 hour  Intake    480 ml  Output      0 ml  Net    480 ml   LBM: Last BM Date: 04/04/16 Baseline Weight: Weight: 47.356 kg (104 lb 6.4 oz) (bed scale with prosthesis on) Most recent weight: Weight: 42.7 kg (94 lb 2.2 oz)       Palliative Assessment/Data:    Flowsheet Rows        Most Recent Value   Intake Tab    Referral Department  Hospitalist   Unit at Time of Referral  Cardiac/Telemetry Unit   Palliative Care Primary Diagnosis  Other (Comment)   Date Notified  04/04/16   Palliative Care Type  Return patient Palliative Care   Reason for referral  End of Life Care Assistance   Date of Admission  04/04/16   Date first seen by Palliative Care  04/05/16   # of days Palliative referral response time  1 Day(s)   # of days IP prior to Palliative referral  0   Clinical Assessment    Palliative Performance Scale Score  20%   Pain Max last 24 hours  6   Pain Min Last 24 hours  2   Dyspnea Max Last 24 Hours  8   Dyspnea Min Last 24 hours  2   Psychosocial & Spiritual Assessment    Palliative Care Outcomes    Patient/Family meeting held?  Yes      Patient Active Problem List   Diagnosis Date Noted  . Goals of care, counseling/discussion   . Iron deficiency anemia   . Palliative care encounter   . Encounter for hospice care discussion   . Acute encephalopathy 04/04/2016  . Acute renal failure superimposed on stage 4 chronic kidney disease (HCC) 04/04/2016  . Anemia of chronic disease 04/04/2016  . Acute on chronic diastolic (congestive)  heart failure (HCC) 03/20/2016  . Protein-calorie malnutrition, severe (HCC) 01/20/2016  . Hyperlipidemia   . Chest pain 07/12/2015  . S/P CABG (coronary artery bypass graft) 07/12/2015  . S/P AKA (above Bowman amputation) unilateral (HCC) 07/12/2015  . Impaired mobility 08/22/2011  . CAD (coronary artery disease) 05/30/2011    Palliative Care Assessment & Plan   Assessment: Patient is certainly hospice eligible given her very advanced coronary artery disease, her advanced kidney disease and her heart failure combined with being 89 years young.  [name] is unable to transfer from bed to chair without significant SOB.    Recommendations/Plan: Scheduled oxycodone for Dyspnea Discussed potential transfer to [facility] with Adrienne Bowman of [facility].  Adrienne Bowman will discuss this with the family.   Goals of Care and Additional Recommendations:  Limitations on Scope of Treatment: Avoid Hospitalization and Minimize Medications  Code Status:DNR  Prognosis:   2-4 weeks possibly less.  Patient is certainly hospice eligible given her very advanced coronary artery disease, her advanced kidney disease and her heart failure combined with being 89 years young.  [name] is unable speak without significant SOB.    Discharge Planning:  Hospice facility  Care plan was discussed with patient and daughters.  Thank you for allowing the Palliative Medicine Team to assist in the care of this patient.   Time In: 10:50 Time Out: 11:15 Total Time 35 Prolonged Time Billed No}       Greater than 50%  of this time was spent counseling and coordinating care related  to the above assessment and plan.  Stephani Police, PA-C  Please contact Palliative Medicine Team phone at [phone] for questions and concerns.

## 2016-04-07 NOTE — Progress Notes (Signed)
SATURATION QUALIFICATIONS: (This note is used to comply with regulatory documentation for home oxygen)  Patient Saturations on Room Air at Rest = 87%  Patient Saturations on 2 Liters of oxygen at rest= 100%  Please briefly explain why patient needs home oxygen: Pt unable to ambulate due to SOB.

## 2016-04-07 NOTE — Progress Notes (Signed)
[name] MRN: [id number] DOB: 08-16-1918   Cancelled Treatment:    Reason Eval/Treat Not Completed: Medical issues which prohibited therapy. Pt with more difficulty breathing per notes and possibly going to [facility] rather than home with hospice.  Will hold off on session.   Kalep Full LUBECK 04/07/2016, 11:54 AM

## 2016-04-08 ENCOUNTER — Ambulatory Visit: Payer: Medicare Other | Admitting: Physician Assistant

## 2016-04-08 ENCOUNTER — Telehealth: Payer: Self-pay | Admitting: *Deleted

## 2016-04-08 DIAGNOSIS — Z515 Encounter for palliative care: Secondary | ICD-10-CM | POA: Insufficient documentation

## 2016-04-08 NOTE — Telephone Encounter (Signed)
Transitional care call attempted.  No answer, no voice mail to leave a message.  Will continue attempts to contact patient and/or daughter.

## 2016-04-12 NOTE — Telephone Encounter (Signed)
Transitional care call attempted.  No answer, no voice mail to leave a message.  Will continue attempts to contact patient and/or daughter.  5/31 appointment was cancelled via automated reminder call.

## 2016-04-13 ENCOUNTER — Ambulatory Visit: Payer: Medicare Other | Admitting: Family Medicine

## 2016-04-13 NOTE — Telephone Encounter (Signed)
Transitional care call attempted.  No answer, no voice mail to leave a message.  Unable to contact patient or family after multiple attempts.  5/31 TCM appointment was cancelled via automated reminder call.  FYI.

## 2016-05-14 DEATH — deceased

## 2017-07-17 IMAGING — DX DG CHEST 2V
2 series · 2 of 2 positions shown · non-contrast
Comparison: January 27, 2004

CLINICAL DATA: Left chest pain since last night.

EXAM:
CHEST  2 VIEW

[w chest pa]
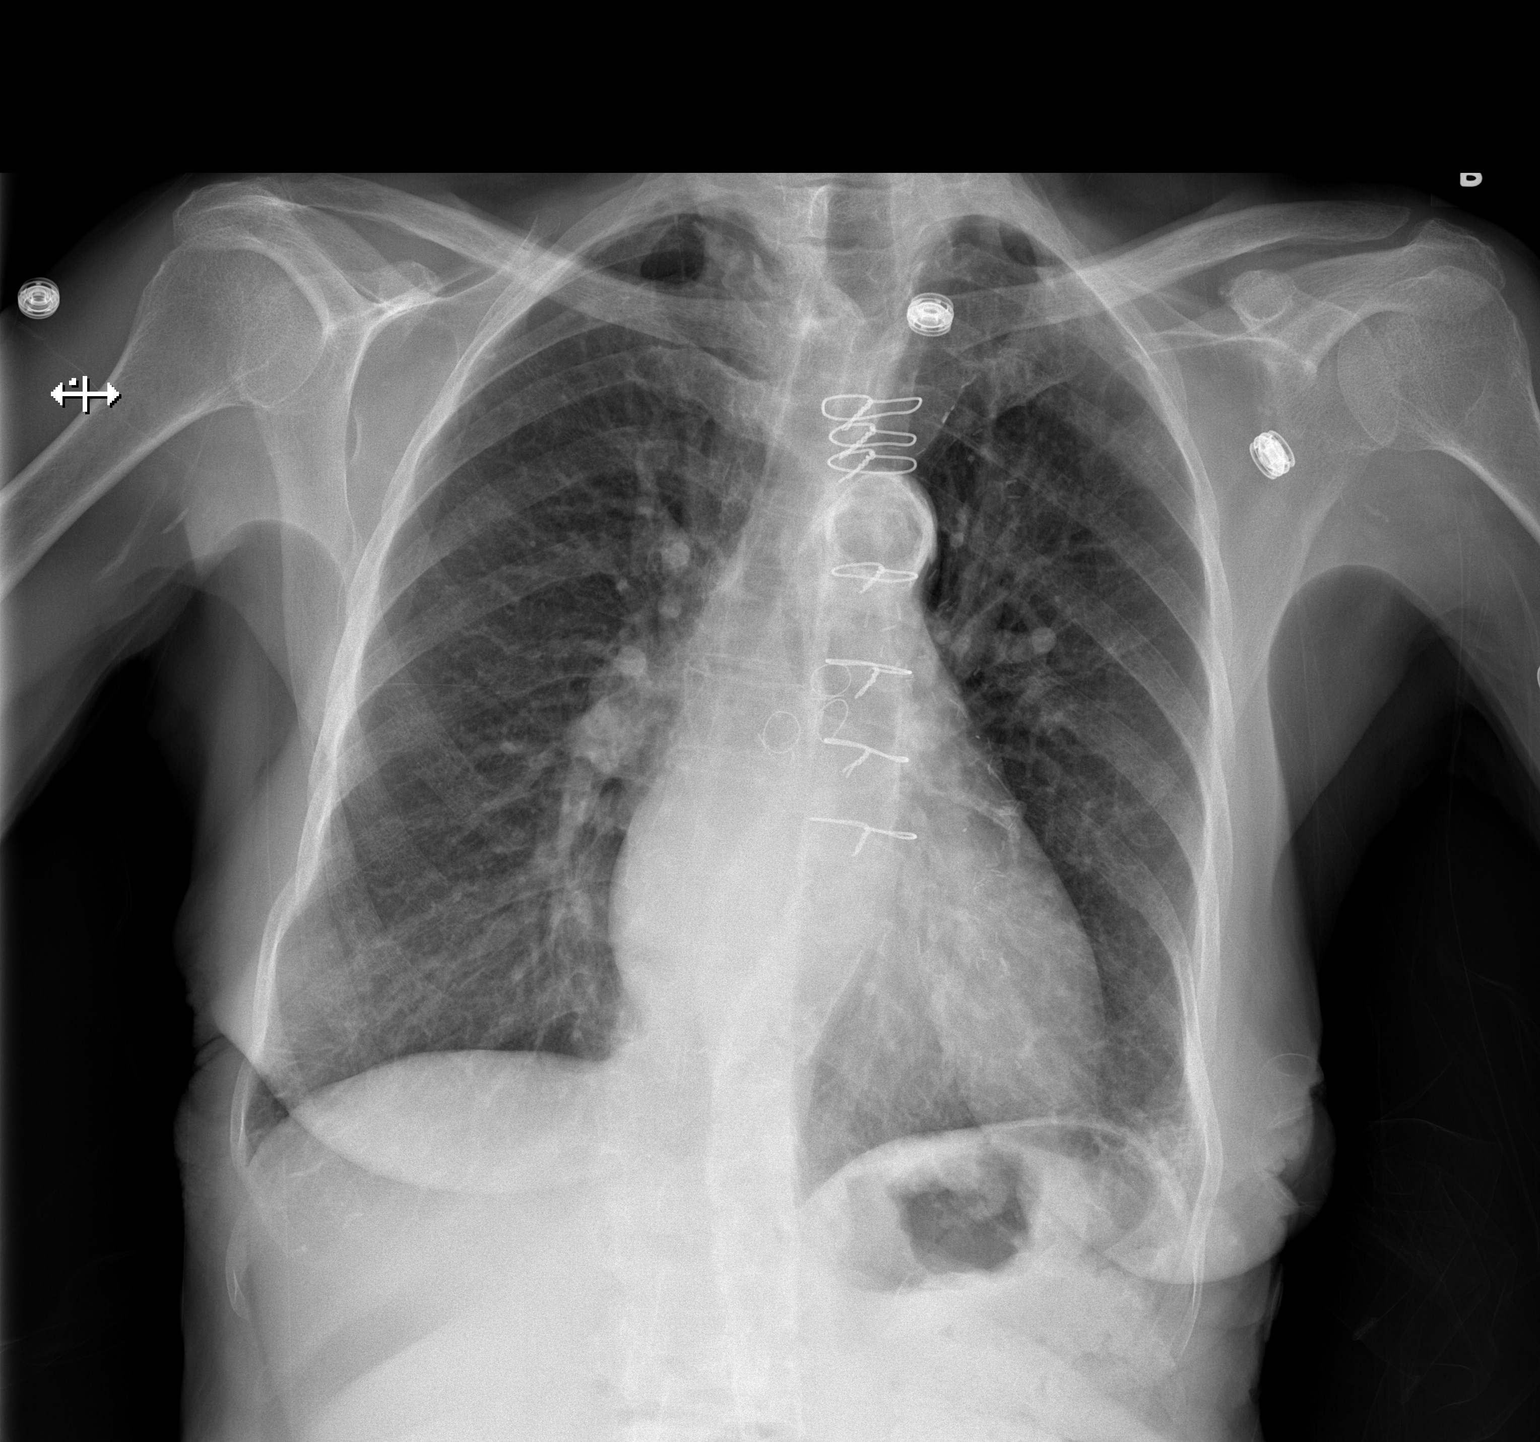

[w chest lat]
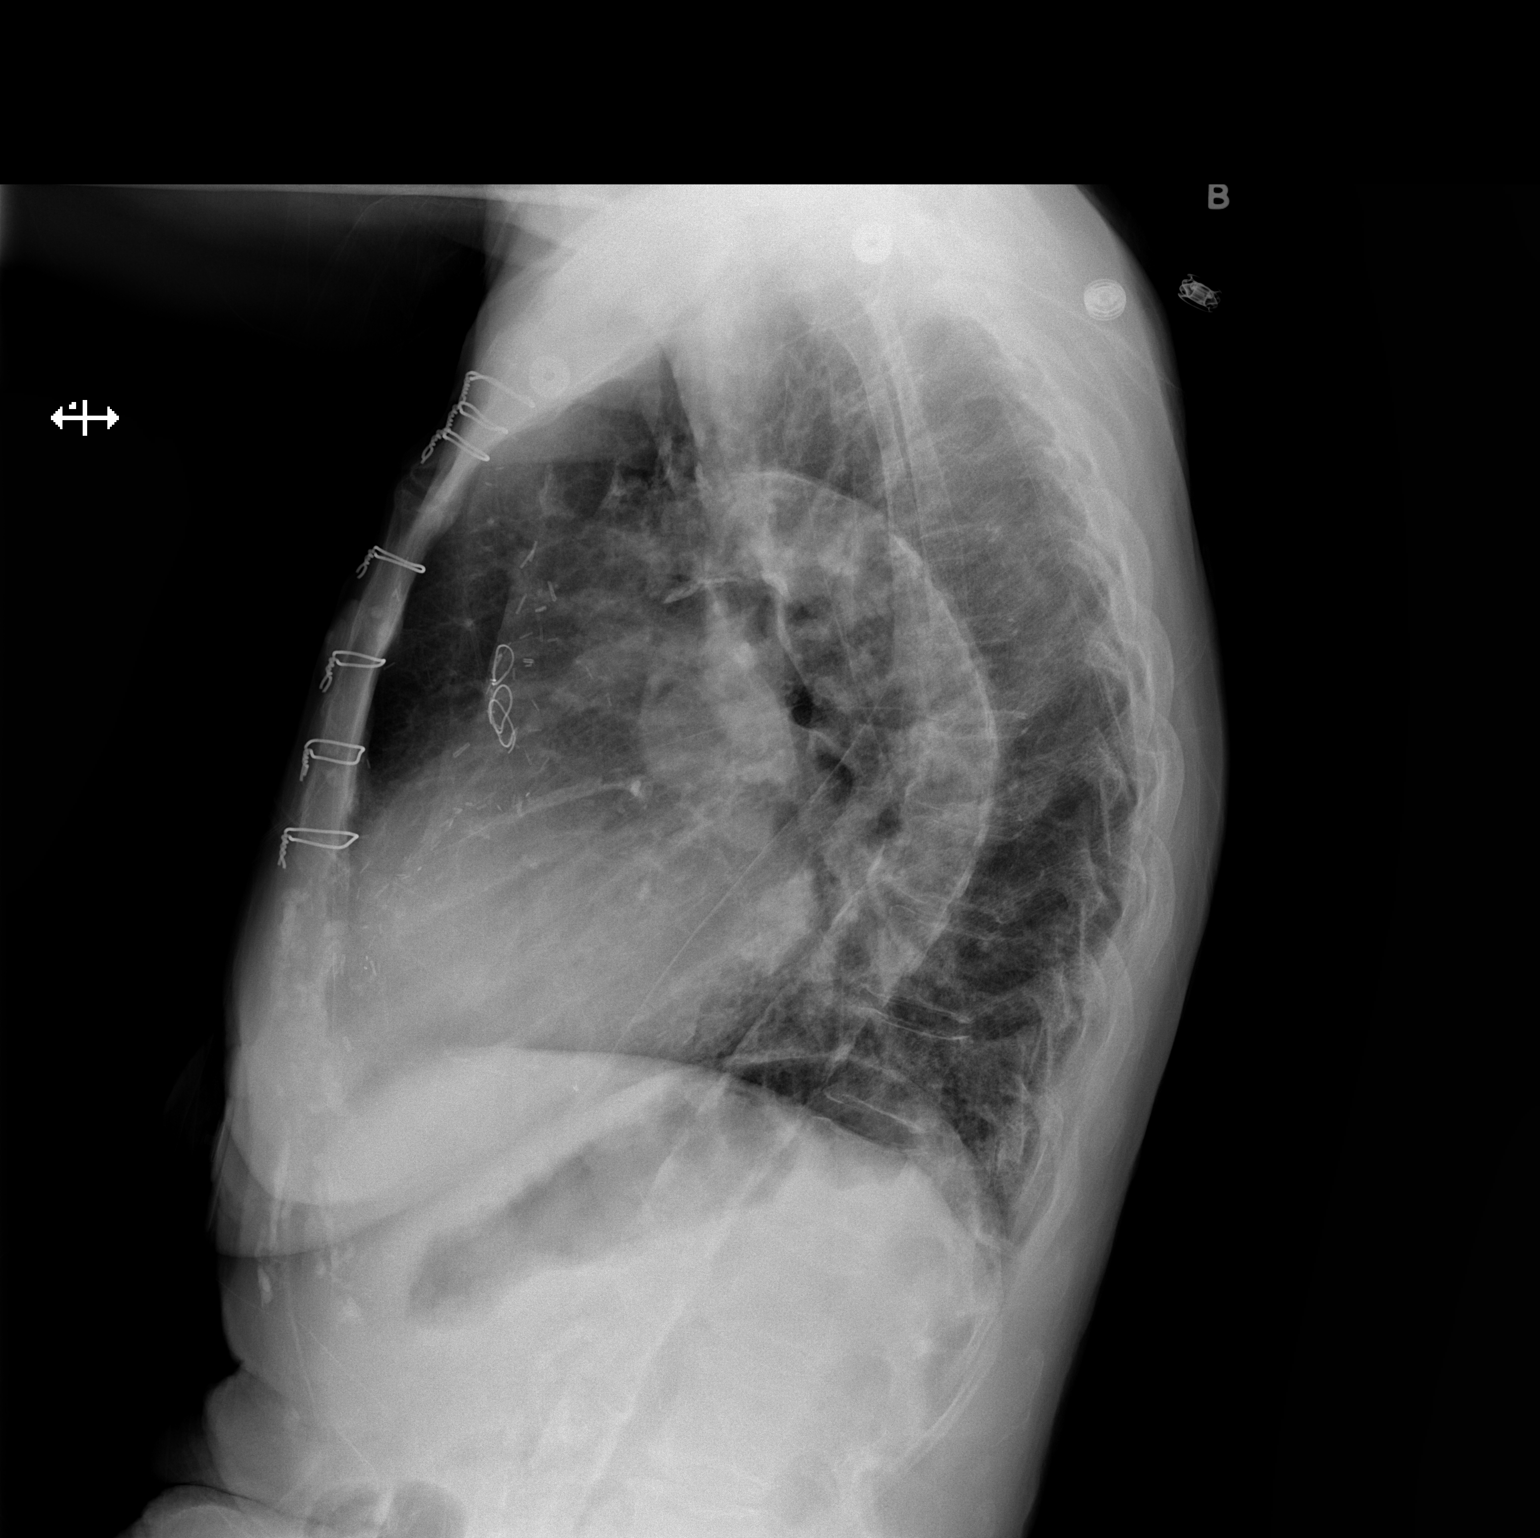

[2 of 2 positions shown; findings below may reference images not displayed]

FINDINGS: The heart size and mediastinal contours are stable. Patient status
post prior median sternotomy and CABG. There is mild interstitial
pulmonary edema. There is mild atelectasis of bilateral lung bases.
There is no focal pneumonia or pleural effusion. The visualized
skeletal structures are unremarkable.
IMPRESSION: Mild bilateral interstitial edema.

## 2019-05-14 ENCOUNTER — Telehealth: Payer: Self-pay | Admitting: *Deleted

## 2019-05-14 NOTE — Telephone Encounter (Signed)
Adrienne Bowman is deceased, recall removed.
